# Patient Record
Sex: Male | Born: 1940
Health system: Southern US, Community
[De-identification: ages and names within clinical notes are randomized; demographics above are authoritative.]

## PROBLEM LIST (undated history)

## (undated) DIAGNOSIS — G4730 Sleep apnea, unspecified: Secondary | ICD-10-CM

## (undated) DIAGNOSIS — I2510 Atherosclerotic heart disease of native coronary artery without angina pectoris: Secondary | ICD-10-CM

## (undated) DIAGNOSIS — J45909 Unspecified asthma, uncomplicated: Secondary | ICD-10-CM

## (undated) DIAGNOSIS — J449 Chronic obstructive pulmonary disease, unspecified: Secondary | ICD-10-CM

## (undated) DIAGNOSIS — E785 Hyperlipidemia, unspecified: Secondary | ICD-10-CM

## (undated) DIAGNOSIS — N400 Enlarged prostate without lower urinary tract symptoms: Secondary | ICD-10-CM

## (undated) DIAGNOSIS — K219 Gastro-esophageal reflux disease without esophagitis: Secondary | ICD-10-CM

## (undated) HISTORY — DX: Sleep apnea, unspecified: G47.30

## (undated) HISTORY — PX: ADENOIDECTOMY: SUR15

## (undated) HISTORY — DX: Benign prostatic hyperplasia without lower urinary tract symptoms: N40.0

## (undated) HISTORY — DX: Hyperlipidemia, unspecified: E78.5

## (undated) HISTORY — DX: Unspecified asthma, uncomplicated: J45.909

## (undated) HISTORY — PX: GALLBLADDER SURGERY: SHX652

## (undated) HISTORY — DX: Atherosclerotic heart disease of native coronary artery without angina pectoris: I25.10

## (undated) HISTORY — DX: Chronic obstructive pulmonary disease, unspecified: J44.9

## (undated) HISTORY — PX: CARDIAC CATHETERIZATION: SHX172

## (undated) HISTORY — PX: TONSILLECTOMY: SUR1361

---

## 2004-05-07 ENCOUNTER — Ambulatory Visit: Payer: Self-pay | Admitting: Internal Medicine

## 2005-03-08 ENCOUNTER — Ambulatory Visit: Payer: Self-pay | Admitting: Internal Medicine

## 2005-03-30 ENCOUNTER — Ambulatory Visit: Payer: Self-pay | Admitting: Internal Medicine

## 2006-04-22 ENCOUNTER — Ambulatory Visit: Payer: Self-pay | Admitting: Internal Medicine

## 2007-06-23 ENCOUNTER — Ambulatory Visit: Payer: Self-pay | Admitting: Internal Medicine

## 2007-08-04 ENCOUNTER — Ambulatory Visit: Payer: Self-pay | Admitting: Internal Medicine

## 2008-12-13 ENCOUNTER — Ambulatory Visit: Payer: Self-pay | Admitting: Cardiology

## 2012-01-14 ENCOUNTER — Inpatient Hospital Stay: Payer: Self-pay | Admitting: Internal Medicine

## 2012-01-14 LAB — COMPREHENSIVE METABOLIC PANEL
Albumin: 3.3 g/dL — ABNORMAL LOW (ref 3.4–5.0)
Alkaline Phosphatase: 99 U/L (ref 50–136)
Anion Gap: 7 (ref 7–16)
BUN: 40 mg/dL — ABNORMAL HIGH (ref 7–18)
Bilirubin,Total: 0.6 mg/dL (ref 0.2–1.0)
Calcium, Total: 7.7 mg/dL — ABNORMAL LOW (ref 8.5–10.1)
Chloride: 102 mmol/L (ref 98–107)
Co2: 27 mmol/L (ref 21–32)
Creatinine: 2.26 mg/dL — ABNORMAL HIGH (ref 0.60–1.30)
EGFR (Non-African Amer.): 28 — ABNORMAL LOW
Glucose: 132 mg/dL — ABNORMAL HIGH (ref 65–99)
Osmolality: 284 (ref 275–301)
SGPT (ALT): 1428 U/L — ABNORMAL HIGH (ref 12–78)
Total Protein: 7 g/dL (ref 6.4–8.2)

## 2012-01-14 LAB — CK TOTAL AND CKMB (NOT AT ARMC)
CK, Total: 1378 U/L — ABNORMAL HIGH (ref 35–232)
CK-MB: 7.8 ng/mL — ABNORMAL HIGH (ref 0.5–3.6)

## 2012-01-14 LAB — SEDIMENTATION RATE: Erythrocyte Sed Rate: 1 mm/hr (ref 0–20)

## 2012-01-14 LAB — FIBRIN DEGRADATION PROD.(ARMC ONLY): Fibrin Degradation Prod.: <10 ug/ml (ref 2.1–7.7)

## 2012-01-14 LAB — CBC
MCH: 30.5 pg (ref 26.0–34.0)
MCHC: 32 g/dL (ref 32.0–36.0)
MCV: 95 fL (ref 80–100)
Platelet: 97 10*3/uL — ABNORMAL LOW (ref 150–440)
RBC: 5.04 10*6/uL (ref 4.40–5.90)
RDW: 14 % (ref 11.5–14.5)

## 2012-01-14 LAB — MAGNESIUM: Magnesium: 2 mg/dL

## 2012-01-14 LAB — PROTIME-INR
INR: 1.3
Prothrombin Time: 16.3 secs — ABNORMAL HIGH (ref 11.5–14.7)

## 2012-01-15 LAB — CBC WITH DIFFERENTIAL/PLATELET
Basophil #: 0 10*3/uL (ref 0.0–0.1)
Basophil %: 0.3 %
Eosinophil #: 0 10*3/uL (ref 0.0–0.7)
HCT: 48.2 % (ref 40.0–52.0)
Lymphocyte %: 8.1 %
MCH: 31.4 pg (ref 26.0–34.0)
MCHC: 32.9 g/dL (ref 32.0–36.0)
MCV: 95 fL (ref 80–100)
Monocyte #: 0.2 x10 3/mm (ref 0.2–1.0)
Monocyte %: 1.9 %
Neutrophil #: 7.7 10*3/uL — ABNORMAL HIGH (ref 1.4–6.5)
Neutrophil %: 89.6 %
RDW: 14.1 % (ref 11.5–14.5)

## 2012-01-15 LAB — URINALYSIS, COMPLETE
Bacteria: NONE SEEN
Glucose,UR: NEGATIVE mg/dL (ref 0–75)
Hyaline Cast: 7
Ketone: NEGATIVE
Protein: 30
Specific Gravity: 1.016 (ref 1.003–1.030)
Squamous Epithelial: <1
WBC UR: 4 /HPF (ref 0–5)

## 2012-01-15 LAB — PROTIME-INR: Prothrombin Time: 15.4 secs — ABNORMAL HIGH (ref 11.5–14.7)

## 2012-01-15 LAB — COMPREHENSIVE METABOLIC PANEL
Albumin: 3.2 g/dL — ABNORMAL LOW (ref 3.4–5.0)
Alkaline Phosphatase: 88 U/L (ref 50–136)
BUN: 40 mg/dL — ABNORMAL HIGH (ref 7–18)
Calcium, Total: 8 mg/dL — ABNORMAL LOW (ref 8.5–10.1)
Chloride: 101 mmol/L (ref 98–107)
Creatinine: 1.62 mg/dL — ABNORMAL HIGH (ref 0.60–1.30)
EGFR (African American): 48 — ABNORMAL LOW
Glucose: 127 mg/dL — ABNORMAL HIGH (ref 65–99)
Potassium: 5.1 mmol/L (ref 3.5–5.1)
SGOT(AST): 1503 U/L — ABNORMAL HIGH (ref 15–37)
Total Protein: 7.1 g/dL (ref 6.4–8.2)

## 2012-01-15 LAB — CK TOTAL AND CKMB (NOT AT ARMC)
CK, Total: 962 U/L — ABNORMAL HIGH (ref 35–232)
CK-MB: 8.4 ng/mL — ABNORMAL HIGH (ref 0.5–3.6)
CK-MB: 8 ng/mL — ABNORMAL HIGH (ref 0.5–3.6)

## 2012-01-15 LAB — RAPID INFLUENZA A&B ANTIGENS

## 2012-01-16 LAB — TROPONIN I: Troponin-I: 0.89 ng/mL — ABNORMAL HIGH

## 2012-01-16 LAB — CBC WITH DIFFERENTIAL/PLATELET
Basophil %: 1.5 %
Eosinophil %: 0 %
HGB: 13.9 g/dL (ref 13.0–18.0)
Lymphocyte %: 6.2 %
MCV: 96 fL (ref 80–100)
Monocyte #: 0.5 x10 3/mm (ref 0.2–1.0)
Monocyte %: 4.5 %
Neutrophil #: 9.1 10*3/uL — ABNORMAL HIGH (ref 1.4–6.5)
Neutrophil %: 87.8 %
Platelet: 95 10*3/uL — ABNORMAL LOW (ref 150–440)
RBC: 4.54 10*6/uL (ref 4.40–5.90)
RDW: 13.9 % (ref 11.5–14.5)
WBC: 10.4 10*3/uL (ref 3.8–10.6)

## 2012-01-16 LAB — PROTEIN / CREATININE RATIO, URINE
Creatinine, Urine: 51.7 mg/dL (ref 30.0–125.0)
Protein, Random Urine: 12 mg/dL (ref 0–12)

## 2012-01-16 LAB — COMPREHENSIVE METABOLIC PANEL
BUN: 29 mg/dL — ABNORMAL HIGH (ref 7–18)
Bilirubin,Total: 0.4 mg/dL (ref 0.2–1.0)
Chloride: 108 mmol/L — ABNORMAL HIGH (ref 98–107)
Co2: 29 mmol/L (ref 21–32)
EGFR (African American): >60
EGFR (Non-African Amer.): >60
Glucose: 144 mg/dL — ABNORMAL HIGH (ref 65–99)
Osmolality: 290 (ref 275–301)
SGPT (ALT): 808 U/L — ABNORMAL HIGH (ref 12–78)
Sodium: 141 mmol/L (ref 136–145)

## 2012-01-16 LAB — GAMMA GT: GGT: 11 U/L (ref 5–85)

## 2012-01-17 LAB — CBC WITH DIFFERENTIAL/PLATELET
Bands: 16 %
Basophil #: 0 10*3/uL (ref 0.0–0.1)
Basophil %: 0.1 %
Eosinophil %: 0.2 %
Lymphocyte #: 1.5 10*3/uL (ref 1.0–3.6)
MCH: 31.6 pg (ref 26.0–34.0)
MCV: 96 fL (ref 80–100)
Monocyte #: 0.8 x10 3/mm (ref 0.2–1.0)
Neutrophil %: 75.2 %
Platelet: 100 10*3/uL — ABNORMAL LOW (ref 150–440)
RBC: 4.82 10*6/uL (ref 4.40–5.90)
RDW: 14.5 % (ref 11.5–14.5)

## 2012-01-17 LAB — COMPREHENSIVE METABOLIC PANEL
Albumin: 2.9 g/dL — ABNORMAL LOW (ref 3.4–5.0)
Anion Gap: 3 — ABNORMAL LOW (ref 7–16)
BUN: 16 mg/dL (ref 7–18)
Bilirubin,Total: 0.8 mg/dL (ref 0.2–1.0)
Chloride: 110 mmol/L — ABNORMAL HIGH (ref 98–107)
Creatinine: 0.85 mg/dL (ref 0.60–1.30)
EGFR (African American): >60
Glucose: 80 mg/dL (ref 65–99)
Osmolality: 285 (ref 275–301)
Potassium: 4.5 mmol/L (ref 3.5–5.1)
Sodium: 143 mmol/L (ref 136–145)
Total Protein: 6.2 g/dL — ABNORMAL LOW (ref 6.4–8.2)

## 2012-01-17 LAB — CK TOTAL AND CKMB (NOT AT ARMC)
CK, Total: 302 U/L — ABNORMAL HIGH (ref 35–232)
CK-MB: 3.6 ng/mL (ref 0.5–3.6)

## 2012-01-17 LAB — LACTATE DEHYDROGENASE: LDH: 366 U/L — ABNORMAL HIGH (ref 85–241)

## 2012-01-18 LAB — COMPREHENSIVE METABOLIC PANEL
BUN: 10 mg/dL (ref 7–18)
Bilirubin,Total: 1.2 mg/dL — ABNORMAL HIGH (ref 0.2–1.0)
Calcium, Total: 8.2 mg/dL — ABNORMAL LOW (ref 8.5–10.1)
Chloride: 104 mmol/L (ref 98–107)
Creatinine: 0.84 mg/dL (ref 0.60–1.30)
EGFR (African American): >60
EGFR (Non-African Amer.): >60
Potassium: 3.8 mmol/L (ref 3.5–5.1)
SGPT (ALT): 640 U/L — ABNORMAL HIGH (ref 12–78)
Sodium: 142 mmol/L (ref 136–145)
Total Protein: 6.3 g/dL — ABNORMAL LOW (ref 6.4–8.2)

## 2012-01-18 LAB — UR PROT ELECTROPHORESIS, URINE RANDOM

## 2012-01-20 LAB — KAPPA/LAMBDA FREE LIGHT CHAINS (ARMC)

## 2012-01-20 LAB — PROTEIN ELECTROPHORESIS(ARMC)

## 2012-01-20 LAB — CULTURE, BLOOD (SINGLE)

## 2012-05-16 ENCOUNTER — Encounter: Payer: Self-pay | Admitting: Internal Medicine

## 2012-06-08 ENCOUNTER — Encounter: Payer: Self-pay | Admitting: Internal Medicine

## 2012-07-09 ENCOUNTER — Encounter: Payer: Self-pay | Admitting: Internal Medicine

## 2012-08-08 ENCOUNTER — Encounter: Payer: Self-pay | Admitting: Internal Medicine

## 2012-08-17 ENCOUNTER — Ambulatory Visit: Payer: Self-pay | Admitting: Unknown Physician Specialty

## 2012-08-18 LAB — PATHOLOGY REPORT

## 2014-04-01 ENCOUNTER — Ambulatory Visit: Payer: Self-pay | Admitting: Internal Medicine

## 2014-05-28 NOTE — Consult Note (Signed)
See dictated note.  CC: elevated ALT and AST could be due to viral rhabdo and be of muscle origin.  Will check aldolase and GGT to help distinguish the difference.  No specific treatment needed at this time.  Will follow with you/  Electronic Signatures: Manya Silvas (MD)  (Signed on 07-Dec-13 12:48)  Authored  Last Updated: 07-Dec-13 12:48 by Manya Silvas (MD)

## 2014-05-28 NOTE — Consult Note (Signed)
PATIENT NAME:  Chris Lozano, Chris Lozano MR#:  [id number] DATE OF BIRTH:  1940-04-07  DATE OF CONSULTATION:  01/17/2012  REFERRING PHYSICIAN:  [name], MD CONSULTING PHYSICIAN:  Heinz Knuckles. Jasai Sorg, MD  REASON FOR CONSULTATION: Chills, transaminitis, and elevated troponin.   HISTORY OF PRESENT ILLNESS: The patient is a 74 year old man with a past history significant for chronic obstructive pulmonary disease who was admitted on 01/14/2012 with rapid onset a few days before of chills and malaise. The patient had been in his usual state of health working and went to work on the day that he became ill. When he came home, he began having chills and increased shortness of breath. He was also having some muscle aches and some nasal congestion. He was felt to have influenza and was kept at home. He missed several days of work and began having worsening chills and fevers and presented for evaluation and was admitted to the hospital. He states that he has had no nausea, vomiting, change in his bowels, or abdominal pain, but on admission he was found to have elevated transaminases. These have come down over time. He had has had no chest pain, but has had some increasing shortness of breath. He had elevated CPK and troponins on admission. An echocardiogram showed no obvious myocardial dysfunction. He was initially treated with oseltamivir and is on his fifth day. He has not received any other antimicrobial therapy. Blood cultures have been negative.   ALLERGIES: No known drug allergies.   PAST MEDICAL HISTORY:  1. Sleep apnea.  2. Chronic obstructive pulmonary disease.   SOCIAL HISTORY: The patient lives with his wife. He has a cat at home. He smokes a pack of cigarettes per day. He works doing Theatre manager, in a factory setting. He denies any alcohol intake.  FAMILY HISTORY: No diabetes. No coronary artery disease. No recent sick contacts.   REVIEW OF SYSTEMS: GENERAL: Positive fevers, chills, and malaise. HEENT: He  has chronic headaches that occur intermittently but have not been bothering him recently. He has had some nasal congestion. No sinus congestion. Some sore throat. NECK: No stiffness. No swollen glands. RESPIRATORY: Positive cough without sputum production. Some increased shortness of breath. CARDIAC: No chest pain. Some increased dyspnea on exertion. No peripheral edema. GI: No nausea, no vomiting, no abdominal pain, and no change in his bowels. GENITOURINARY: No change in his urine. MUSCULOSKELETAL: He has had myalgias and arthralgias, but no frank arthritis. SKIN: No rashes. NEUROLOGIC: No focal weakness. No dizziness. No falls. PSYCHIATRIC: No complaints. All other systems are negative.   PHYSICAL EXAMINATION:   VITAL SIGNS: T-max 98.7, T-current 97.6, pulse 87, blood pressure 108/64, and 93% on 2 liters.   GENERAL: A 74 year old white man in no acute distress.   HEENT: Normocephalic, atraumatic. Pupils equal and reactive to light. Extraocular motion intact. Sclerae were slightly injected, but no evidence for emboli or petechiae in the sclerae, conjunctivae, or lids. Oropharynx shows no erythema or exudate. Gums are in fair condition.   NECK: Supple. Full range of motion. Midline trachea. No lymphadenopathy. No thyromegaly.   PULMONARY: Lungs are clear to auscultation bilaterally, although he did have some significant rhonchorous cough. The lung fields remained clear.   CARDIAC: Regular rate and rhythm without murmur, rub, or gallop.  ABDOMEN: Soft, nontender, and nondistended. No hepatosplenomegaly. No hernia is noted.   EXTREMITIES: No evidence for tenosynovitis. No edema.   SKIN: No rashes. No stigmata of endocarditis, specifically no Janeway lesions or Osler nodes.  NEUROLOGIC: The patient is awake and interactive, moving all four extremities.   PSYCHIATRIC: Mood and affect appeared normal.  LABS/RADIOLOGIC STUDIES: BUN 16, creatinine 0.85, bicarbonate 30, anion gap 3, AST 422, ALT  769, alkaline phosphatase 88, and total bilirubin 0.8. CPK 302, MB fraction 3.6, and troponin 0.48. On admission his LFTs showed an AST greater than 2000 and ALT of 1428 with an alkaline phosphatase of 99 and total bilirubin of 0.6. Total CPK was 1378 with a MB fraction of 7.8 and troponin 3.80. LFTs and cardiac enzymes have declined over the course of his hospitalization. White count today is 9.2 with a hemoglobin 15.2, platelet count 100, and ANC 6.9. On admission, his white count was 8.4.   Blood cultures are negative x2.   Rapid influenza testing is negative x2.   Urinalysis was unremarkable and culture had no growth.  Hepatitis serologies showed positive hepatitis A IgG/IgM, but a negative hepatitis A IgM. His hepatitis B surface antibody was negative, but all other hepatitis B serologies were negative and hepatitis C antibody was negative.   Echocardiogram performed on 01/15/2012 showed normal function with an ejection fraction of greater than 55%. The right ventricle was grossly normal in size. The aortic valve opened well. There was mild tricuspid regurgitation and mitral valve was grossly normal with trace mitral regurgitation. The left ventricular wall motion was normal.   CT of the chest and abdomen without contrast showed a moderate central lobular emphysema. There were multiple indeterminant pulmonary nodules the largest of which was 7 mm.  CT scan of the head without contrast showed no acute intracranial process.  Chest x-ray showed findings of chronic obstructive pulmonary disease, but no other acute infiltrates.   IMPRESSION: A 74 year old male with a history of chronic obstructive pulmonary disease who was admitted with viral myocarditis without cardiomyopathy.   RECOMMENDATIONS:  1. His constellation of symptoms appear to be consistent with a viral infection. He developed marked transaminitis and evidence for myocardial injury without any focal or generalized myocardial  dysfunction. There are numerous viruses which have been associated with this type of infection, including enterovirus such as coxsackie A and B, influenza, adenovirus, parvovirus, respiratory syncytial virus, and hepatitis B and C, among others. None of these have specific treatments available, except influenza for which he tested negative. He also tested negative for hepatitis B and C. There was a recent article linking RSV infection in older adults presenting with respiratory infections.  2. Given that he appears to be improving, I would follow his CPK and LFTs over time. There is no specific therapy available.  3. I would not continue Tamiflu therapy.  4. His echo showed no evidence of cardiomyopathy, which can be a long-term sequela of myocarditis.  5. He was strongly encouraged to quit smoking.   This is a moderately complex infectious disease case. Thank you very much for involving me in Chris Lozano care.  ____________________________ Heinz Knuckles. Takila Kronberg, MD meb:slb D: 01/17/2012 13:56:23 ET T: 01/17/2012 15:10:33 ET JOB#: [id number]  cc: Heinz Knuckles. Johnette Teigen, MD, <Dictator> [place] Edwin E Sinclair Alligood MD ELECTRONICALLY SIGNED 01/21/2012 10:18

## 2014-05-28 NOTE — Consult Note (Signed)
    General Aspect 74 yo male with history of sleep apnea with no cardiac history who was admitted after presenting to the er wiht several day history of fever, myalgias, weakness, anorexia, cough and increasing lethargy. He was noted to have elevated cpk, mb,troponin, hepatic enzymes, renal insuffiency and relative hypoxia. EKG was unremarkable for injury or ischemia and he denied chest pain. He is a difficult histgorian this am due to lethargy and somnolence. He has apparently been taking codeine cough preperation prior to admission. CXR did not reveal pulmonary edema but suggested pneumonitis. Chest ct unremarkable for acute process. Echo is pending. Pt was hypotgensive on admission.   Physical Exam:   GEN lethargic    HEENT dry oral mucosa    NECK supple    CARD Regular rate and rhythm  Normal, S1, S2  No murmur    ABD normal BS    LYMPH negative neck, negative axillae    EXTR negative cyanosis/clubbing, negative edema    SKIN No ulcers, skin turgor poor    NEURO cranial nerves intact, motor/sensory function intact    PSYCH lethargic   Review of Systems:   General: Fatigue  Fever/chills    Respiratory: Frequent cough    Cardiovascular: No Complaints    Musculoskeletal: Muscle or joint pain    ROS Pt not able to provide ROS  symptoms from history in chart; not able to give much history this am    Medications/Allergies Reviewed Medications/Allergies reviewed   EKG:   EKG NSR    Abnormal NSSTTW changes    No Known Allergies:     Impression 74 yo male with history of sleep apnea and emphysema who presented with several day history of myalgia, cough, weakness, fatigue and increasing somnolence. He was noted to have evidence of dehydration with renal insuffiency, elevated hepatic enzymes, elevated cardiac enzymes and markers. EKG and CXR unremarkable for acute ischemic changes or pulmonaary edema. Echo is pending. He was hypotensive on admission which has improved with  hydration. Etiology of multisystem abnormality is unclear at present. Blood cultures are negative thus far. Flu titer is negative thus far. While cardiac ischemia could explain his elevated cardiac markers and enzymes, lack of ischemic symptoms and ekgs changes suggests possible viril myocarditis. Would continue with hydration to treat volume depletion. Continue with asa at present. Agree with holding iv anticoagulation at present., Will review echo when available. Agree with gi evaluation and consider infectious disease eval.    Plan 1. IV hyddration 2. COntinnue with current meds 3. Will review echo when available 4. Continue with asa for now 5. Agree with holding heparin for now 6. Follow electrolytes, liver funciton and cardiac markers 7.Will follow with you   Electronic Signatures: Teodoro Spray (MD)  (Signed 07-Dec-13 08:12)  Authored: General Aspect/Present Illness, History and Physical Exam, Review of System, EKG , Allergies, Impression/Plan   Last Updated: 07-Dec-13 08:12 by Teodoro Spray (MD)

## 2014-05-28 NOTE — H&P (Signed)
PATIENT NAME:  COLLEN, VINCENT MR#:  [id number] DATE OF BIRTH:  04/13/1940  DATE OF ADMISSION:  01/14/2012  ADDENDUM:    Elevated D-dimer: The patient has hypoxemia and shortness of breath. He is not tachycardic but possibilities of pulmonary embolism are existent.  It is very unlikely with his hypercarbic respiratory failure, but is still a possibility. I cannot do a CT with IV contrast due to his significant acute kidney injury and I cannot anticoagulate him at this moment due to his significant thrombocytopenia and the possibility that with his liver failure he could develop disseminated intravascular coagulation, which is a difficult situation.  I am going to continue to follow up on those results.If this hypoxemia persist, again we will continue to do V/Q scan.    ____________________________ [place] Sink, MD rsg:bjt D: 01/14/2012 23:18:38 ET T: 01/15/2012 08:23:21 ET JOB#: [id number]  cc: [place] Sink, MD, <Dictator> Amaziah Raisanen America Brown MD ELECTRONICALLY SIGNED 01/23/2012 14:09

## 2014-05-28 NOTE — Discharge Summary (Signed)
PATIENT NAME:  Chris Lozano, Chris Lozano MR#:  [id number] DATE OF BIRTH:  1940-12-15  DATE OF ADMISSION:  01/14/2012 DATE OF DISCHARGE:  01/18/2012  FINAL DIAGNOSES:  1. Viral myocarditis.  2. Viral hepatitis.  3. Acute renal failure.  4. Acute on chronic respiratory failure.  5. Chronic obstructive pulmonary disease with acute exacerbation.  6. Dehydration, resolved.  7. Sleep apnea, constant positive airway pressure intolerant.   HISTORY AND PHYSICAL: Please see the dictated admission history and physical.   [place]: The patient was admitted with altered mental status, finding of acute respiratory failure with respiratory acidosis, as well as acute renal failure. He was found to have elevated cardiac enzymes, evidence of rhabdomyolysis, and severely elevated AST and ALT. He was placed on IV fluids. Nephrology and gastroenterology were consulted as was cardiology. Multiple cultures were sent, which were negative. C-reactive protein was markedly elevated, and hepatitis serologies were all negative. It was felt that he likely had a viral syndrome, the exact virus is unclear, and this was likely related to hepatitis, rhabdomyolysis, which had led to renal failure. His kidney function returned to normal, fortunately. His liver enzymes have trended downward. His acute respiratory failure improved; however, his oxygen saturation is 86% on room air at rest, and so he will need to be continued on oxygen 2 liters nasal cannula and this was arranged.   His mental status was initially altered as noted above, it did improve. He had some morning confusion, suggesting there may be some mild underlying dementia; however, this improved over the course of the day and was back to normal per family members. He had no further fevers or chills. His CKs trended down nicely while on IV fluids.   As noted above, cardiology consultation was obtained for the elevated troponin. Discussed that technically this meets  the criteria for acute myocardial infarction in that his exact history is unclear, but did have some shortness of breath, however, it was thought more likely this was a viral myocarditis rather than an infarction. He is not a candidate for statins secondary to his elevated transaminases. His systolic blood pressures range 110 to 115 and his heart rate in the 60s, and so was not a candidate for beta blockers, angiotensin receptor blockers or ACE inhibitors. He underwent echocardiogram, which revealed preserved LV function without evidence of inflammatory myocarditis on the echo, fortunately.   He is ambulating well, going over 350 feet independently, although again required oxygen as noted above. It was felt that he would benefit from home health to monitor his oxygenation as well as the patient's poor overall understanding of his medical issues and complicated nature of his course. He will be held out of work until he follows up in our office, but be discharged in stable condition on home oxygen 2 liters nasal cannula continuously with the plan to return to see [place] within one week. His diet should be 2 gram sodium. His physical activity will be up as tolerated.   DISCHARGE MEDICATIONS:  1. Astelin two sprays b.i.d., one spray to each nostril.  2. Enteric-coated aspirin 325 mg p.o. daily.  3. Ranitidine 150 mg p.o. daily for gastric protection.  4. NicoDerm patch 14 mg topically daily x6 weeks, then will wean from there.  5. Tussionex 5 mL p.o. b.i.d. p.r.n. cough.  6. Advair 100/50 b.i.d.  7. Albuterol metered-dose inhaler 2 puffs q.4 hours as needed for wheezing.   NOTE: He was given instructions to avoid over-the-counter cold medications  unless he discussed with his doctor. He should hold his lovastatin until okay by his physician.    ____________________________ Adin Hector, MD bjk:ap D: 01/18/2012 13:00:42 ET T: 01/18/2012 14:45:35 ET JOB#: [id number]  cc: Tama High III, MD,  <Dictator> Javier Docker. Ubaldo Glassing, MD [name] MD ELECTRONICALLY SIGNED 01/20/2012 12:24

## 2014-05-28 NOTE — Consult Note (Signed)
Patient had rhabdo from viral illness which affected his muscles but did not hit his liver.  His GGT was normal, indicating that the process did not cause elevation of SGOT or SGPT from the liver but from the muscles.  Therefore with nl TB and alk phos I will sign off, reconsult if needed.  Electronic Signatures: Manya Silvas (MD)  (Signed on 08-Dec-13 10:01)  Authored  Last Updated: 08-Dec-13 10:01 by Manya Silvas (MD)

## 2014-05-28 NOTE — Consult Note (Signed)
Impression: 74yo male w/ h/o COPD admitted with viral myocarditis without cardiomyopathy.  His constellation of symptoms appear to be consistent with a viral infection.  He developed marked transaminitis and evidence for myocardial injury without any focal or generalized myocardial dysfunction.  There are numerous viruses which have been associated with this infection, including Enteroviruses such as [place] A and B, Influenza, Adenovirus, Parvovirus, RSV, hepatitis B and C, among others.  None of these have specific treatments available except Influenza for which he tested negative.  There was recently an article linking RSV to infection in older adults. Given that he appears to be improving, would follow his CPK and LFTs over time.  No specific therapy is available. His echo showed no evidence for cardiomyopathy which can be a long term sequela of myocarditis. He was strongly encouraged to quit smoking.    Electronic Signatures: Ahmadou Bolz, Heinz Knuckles (MD) (Signed on 09-Dec-13 13:49)  Authored   Last Updated: 09-Dec-13 13:54 by Estuardo Frisbee, Heinz Knuckles (MD)

## 2014-05-28 NOTE — Consult Note (Signed)
PATIENT NAME:  Chris Lozano, Chris Lozano MR#:  [id number] DATE OF BIRTH:  [birth year]  DATE OF CONSULTATION:  01/15/2012  REFERRING PHYSICIAN:   CONSULTING PHYSICIAN:  Manya Silvas, MD  HISTORY OF PRESENT ILLNESS: Patient is a 74 year old white male who was admitted to the hospital because of severe viral-like illness characterized by fever, runny nose, myalgias, arthralgias, chills and breathing difficulty as well as a dehydration.   Patient was deemed to have multiorgan failure including respiratory, heart, kidney and liver problems. He was thought to have rhabdomyolysis because of a viral syndrome. He had elevated SGPT, SGOT and I was asked to see him for possible liver involvement.   Patient's wife says that he actually got sick five days ago and had drenching sweats and muscle aches but his muscle aches have improved. Currently at this moment he denies muscle pain, denies nausea, vomiting, dysphagia, diarrhea, constipation.   PAST MEDICAL HISTORY:  1. Chronic obstructive pulmonary disease.  2. Obstructive sleep apnea. 3. Benign prostatic hypertrophy,   PAST SURGICAL HISTORY:  1. Gallbladder removal.  2. Tonsillectomy.   SOCIAL HISTORY: He smoked a pack a day for 50 years.   MEDICATIONS: Aspirin and vitamins and unknown prostate medication.   ALLERGIES: No known drug allergies.   PHYSICAL EXAMINATION:  GENERAL: Elderly white male in no acute distress sitting up in bed on oxygen.   HEENT: Sclerae nonicteric. Conjunctivae negative.   CHEST: Decreased air flow in anterior fields.   HEART: No murmurs or gallops I can hear.   ABDOMEN: Nontender. No hepatosplenomegaly. No masses. No bruits.   SKIN: Warm and dry.   LABORATORY, DIAGNOSTIC AND RADIOLOGICAL DATA: B-type natriuretic peptide 5500, creatinine 2.26, BUN 40, total protein 7, albumin 3.3, total bilirubin 0.6, alkaline phosphatase 99, SGOT greater than 2000, SGPT 1428, CPK 1378, CPK-MB 7.8. Troponin 3.8; that was on admission.  Today SGOT 1500, SGPT 1200, CPK 1400, CPK-MB 8.4, troponin 1.8, white count 8.5, hemoglobin 15.9, platelet count 88. CT of the chest and abdomen done for multiorgan failure shows moderate centrilobular emphysema, multiple indeterminant pulmonary nodules. The liver, spleen, adrenals and pancreas were unremarkable. CT of his head showed no abnormality. Chest x-ray showed mild interstitial edema.   ASSESSMENT: Rhabdomyolysis from viral syndrome, very likely is the cause for his elevated SGPT and SGOT. It is quite possible these could be muscle enzymes rather than coming from the liver. His bilirubin is normal, alkaline phosphatase is normal and his liver is not enlarged and is not palpable and there is no tenderness in the right upper quadrant.   PLAN: Will get a GGT and get an aldolase. Follow SGOT and SGPT. I expect these to continue to come down and do not foresee any serious problems with his liver during this illness.  ____________________________ Manya Silvas, MD rte:cms D: 01/15/2012 12:55:45 ET T: 01/15/2012 13:24:26 ET JOB#: [id number]  cc: Manya Silvas, MD, <Dictator> Adin Hector, MD Leonie Douglas. Doy Hutching, MD Ocie Cornfield. Ouida Sills, MD  Manya Silvas MD ELECTRONICALLY SIGNED 01/22/2012 11:56

## 2014-05-28 NOTE — H&P (Signed)
PATIENT NAME:  Chris Lozano, Chris Lozano MR#:  [id number] DATE OF BIRTH:  1940-09-11  DATE OF ADMISSION:  01/14/2012  REFERRING PHYSICIAN: Dr. Jasmine December  PRIMARY CARE PHYSICIAN:  [name]   REASON FOR ADMISSION: Malaise and increased shortness of breath.   HISTORY OF PRESENT ILLNESS: Mr. Brozek is a very nice 74 year old gentleman who has a history of chronic obstructive pulmonary disease and sleep apnea. The patient is a patient of [name] and he called him at the beginning of the week complaining of fever, runny nose, myalgias, arthralgias, and chills. They suspected that he could have the flu and told him not to go to the clinic, just rest and have plenty of fluids. The patient has been working and did not show up to work in the last couple of days due to the severity of his illness. Apparently everything started with a significant fever, runny nose, cough, and a temperature of 100 degrees Fahrenheit.  The patient started developing some chills within the last couple of days and he has decreased his urine production significantly. His urine has been really concentrated and orange color. He has been very thirsty and dehydrated and drinking large amounts of fluid. He started having some balance problems today and shortness of breath. Overall he has chronic shortness of breath for which he cannot walk more than 10 feet without being winded, but today the shortness of breath was more significant and he actually had significant decrease of his oxygen saturation at 80% on room air.  The wife states that the patient has been confused and drifts to one side and has not been able to even stand today. He also had significant trembling.   On examination in the ER the patient looks significantly ill. He has multiorgan failure including liver, kidney, heart, and respiratory.  His ABGs showed that the patient was significantly acidotic with pH 7.24 and pCO2 64. The patient has been put on oxygen and his oxygen  saturation has been improving. He is actually a very stoic person and he says that nothing hurts.   REVIEW OF SYSTEMS: CONSTITUTIONAL: Positive fever, positive fatigue. Positive weakness. No significant weight loss or weight gain. EYES: Denies any blurry vision, redness, inflammation or decreased eyesight. ENT: Denies any tinnitus, ear pain, or hearing loss. Denies any epistaxis. Positive snoring, positive postnasal drip, positive rhinorrhea and runny nose. Negative odynophagia or sore throat but the wife says that he has been having some trouble talking with a raspy voice. No difficulty swallowing. RESPIRATORY: Positive cough. Positive wheezing. Positive dyspnea. Positive increased secretions, all clear. Negative pneumonia. Positive chronic obstructive pulmonary disease. Negative painful respirations. CARDIOVASCULAR: No chest pain, no orthopnea, no edema, no arrhythmias, no palpitations, no syncope, no edemas. GI: No nausea, vomiting, or diarrhea. No abdominal pain. No hematemesis. No melena. No gastroesophageal reflux disease. No jaundice. No rectal bleeding. No constipation. No hemorrhoids. GU: Positive for decreased urine production with very concentrated urine. No dysuria or hematuria. ENDOCRINE: Positive thirst, but no polyuria or polyphagia. He actually had a very low appetite. No thyroid problems or cold or heat intolerance. HEMATOLOGIC/LYMPHATIC: No anemia, easy bruising, or swollen glands.  SKIN: Positive very dry skin but no rashes or petechiae. MUSCULOSKELETAL: Positive myalgias and arthralgias all over his body. NEUROLOGIC: No numbness or weakness. No dysarthria. No CVA or transient ischemic attack. Positive balance problems. PSYCH: Negative for anxiety or depression.   PAST MEDICAL HISTORY:  1. Chronic obstructive pulmonary disease/emphysema.  2. Obstructive sleep apnea, not using  CPAP, cannot tolerate.  3. History of benign prostatic hypertrophy.  ALLERGIES: No known drug allergies.   PAST  SURGICAL HISTORY:  1. Cholecystectomy. 2. Tonsillectomy.   FAMILY HISTORY: The patient states no cancer. No coronary artery disease. No strokes. No hypertension or diabetes.   SOCIAL HISTORY: The patient works at  [employer] in maintenance. He has smoked one pack a day for the past 50 years and he is not interested in quitting. Tobacco cessation counseling given to the patient for at least four minutes.  Alcohol: He denies any significant alcohol use.  He states that he has not drank in many years and the family corroborates the history.   MEDICATIONS:  Positive aspirin, vitamins, and he is taking a prostate medication that he does not know the name of.    PHYSICAL EXAMINATION:  VITAL SIGNS: Blood pressure of 100/63, pulse around 79-89, temperature 99.2, pulse oximetry 80% on room air.   GENERAL: The patient is alert and oriented times three. No significant acute distress or respiratory distress at this moment. No use of accessory muscles. The patient looks acutely ill, though. He looks very dehydrated and at this moment he is very passive not interacting much but he engages in conversation once he is addressed.   HEENT: His pupils are equal and reactive. Extraocular movements are intact. His mucosa is very dry. Anicteric sclerae. Conjunctivae pale. No oral lesions. No oropharyngeal exudates.   NECK: Supple. No JVD. No thyromegaly. No adenopathy. No lymphadenopathy. No neck rigidity.  No masses. Normal range of motion. No carotid bruits are auscultated.   CARDIOVASCULAR: Regular rate and rhythm with occasional PVCs. The patient has distant heart tones.  No displacement of PMI. No pain to palpation of the chest wall.   LUNGS: Overall clear. No signs of congestion. No rhonchi. No wheezing. Decreased respiratory sounds in bases. No dullness to percussion. No use of accessory muscles at this moment.   ABDOMEN: Soft, nontender, nondistended. No hepatosplenomegaly. No masses. No pain with  palpation and percussion of the liver. No hepatic stigmata. No rebound tenderness. Bowel sounds are positive.   GENITAL: Negative for external lesions.   EXTREMITIES: No edema, no cyanosis, no clubbing. Positive very cold extremities with mottled skin on the tips of the fingers. Capillary refill about 5 seconds.   SKIN: Very dry, decreased turgor. No rashes or petechiae. The patient looks very pale.   NEUROLOGIC: Cranial nerves II through XII intact. Strength is equal in four extremities. No focal deficits.   LYMPHATICS: Negative for lymphadenopathy in the neck or supraclavicular areas.   PSYCHIATRIC: The patient has a flat affect but he is oriented times three.   MUSCULOSKELETAL: Pain to palpation of multiple joint groups and arthralgias, but no arthritis.   LABORATORY DATA: Chest x-ray showed mild interstitial edema that could be interstitial infiltrates, small nodular density overlying the right lung which could be nipple shadow. CT of the abdomen and chest showed moderate centrilobular emphysema, multiple intermediate pulmonary nodules, the largest of which measures 7 mm. Follow-up noncontrast CT in three months recommended. Liver, spleen, adrenals, and pancreas look okay without any specific abnormalities. Kidneys are fine without any hydronephrosis. No lytic or aggressive osseous problems. BNP is 5500, glucose 132. BUN 40, creatinine 2.26, sodium 136, potassium 4.8, calcium 7.7. LFTs showed normal alkaline phosphatase, decreased albumin, more than 2000 AST  and more than 1400 ALT. Total CK of 1300, troponin is 3.8.  White count is 8.4, red blood cells 5. Erythrocyte sedimentation rate is  1. Hemoglobin 15, platelets are 97. INR is 1.3. D-dimer is 1.12. pH 7.24, pCO2 64, pO2 91 on 2 liters nasal cannula, HCO3 27.   EKG: No ST elevation. No ST depression. Positive premature ventricular contractions. Sinus rhythm.  P pulmonale is positive. No point TT waves.   ASSESSMENT AND PLAN: 74 year old  gentleman with history of chronic obstructive pulmonary disease and obstructive sleep apnea, had 4 to 5 days of flu-like symptoms with cough, rhinorrhea, increased shortness of breath, increased thirst, myalgias, arthralgias, and significant dehydration comes to the ER and is found to be severely dehydrated with multiorgan failure.  1. Viral-like syndrome: The patient has a negative flu swab. I cannot exclude the possibility of this being the flu since the test is not 100% accurate; the test does not exclude the possibility of this being the flu.  The patient is very sick and his sickness is highly suspicious of the flu for which I am going to start treatment with Tamiflu. I will repeat the testing with rapid flu in the morning and I am going to send serum antibodies for the flu to the outpatient facility for confirmation of the diagnosis. This could be also a different viral entity.  2. Multiorgan failure:  a.) Acute respiratory failure: The patient came in with elevated CO2, decreased oxygen saturations of 80%, did have improvement with 2 liters nasal cannula. Still the patient has significant shortness of breath. He does have chronic obstructive pulmonary disease and sleep apnea, although this seems to be an acute process. His x-rays show interstitial edema that could be mostly interstitial pneumonitis. No signs of pulmonary edema related to cardiogenic edema or fluid overload. The patient actually sounds very clear  in his chest right now despite the fact that his BNP is elevated. We will continue with oxygen and nebulizers and we have to monitor closely for progression of his respiratory failure. He has hypercarbic respiratory failure, which I think is more chronic in nature due to his sleep apnea and chronic obstructive pulmonary disease, although, again, this is an acute process that is making it worse.  b.) Cardiovascular failure: The patient is not in congestive heart failure, but he has elevated  troponin. He does not have any chest pain. No previous history of coronary artery disease. This could be related to viral myocarditis versus non-ST myocardial infarction. The non-ST myocardial infarction could be secondary to systemic failure and cardiovascular burden the patient is having. The case was discussed with Dr. Jasmine December who has called Dr. Ubaldo Glassing. Dr. Ubaldo Glassing has indicated no anticoagulation at this moment due to the patient being so sick and his low platelets. He has having low blood pressure due to cardiovascular or circulatory collapse but he is not in imminent shock at this moment. We are going to continue IV fluid hydration. If the patient starts having pulmonary edema, we will have to back off on the fluids and possibly use a BiPAP or CPAP.       c.) Liver failure: Shock liver due to hypotension and rhabdomyolysis,  possible inflammation  due to vital process. Serial enzymes are going to be checked. Coags are stable right now. His INR is 1.3 and his PTT is normal. I am going to order a fibrinogen and fibrin degradation products to rule out beginning of DIC. GI consultation granted at this moment.  3. Acute kidney injury due to dehydration, rhabdomyolysis, and inflammatory process: IV fluids are being given. I am not going to treat the rhabdomyolysis aggressively due  to the fact that the patient has a non-ST myocardial infarction at this moment and I do not want to cause him to have severe heart failure. Renal consult is ordered since this could be ATN.  4. Rhabdomyolysis due to myositis: The patient has a viral syndrome with severe myalgias. He is dehydrated and that would be the cause of the rhabdomyolysis.  5. Elevated cardiac enzymes, non-ST myocardial infarction: Continue aspirin. No anticoagulation at this moment. No blood pressure medications like a beta blocker due to borderline low blood pressure. His heart rate is actually stable. Cardiac consult and echocardiogram ordered. Possibly due to  viral myositis.  6. Chronic obstructive pulmonary disease: Stable. We are going to continue nebulizers as needed.  7. Sleep apnea: The patient has elevated CO2. He does not want to use BiPAP at this moment.  8. CODE STATUS: The patient is FULL CODE.   I discussed the case with Dr. Doy Hutching who is aware of it. We are going to sign off the case to [facility] since [name] is his primary care physician.   TIME SPENT: I spent 85 minutes with this patient today and this case, discussing the case with consultant, Dr. Doy Hutching, and the ER physician.         The patient is critical care and he is going to be put in the Critical Care Unit.    ____________________________ [place] Sink, MD rsg:bjt D: 01/14/2012 22:53:19 ET T: 01/15/2012 07:26:49 ET JOB#: [id number]  cc: [place] Sink, MD, <Dictator> Adin Hector, MD Cristi Loron MD ELECTRONICALLY SIGNED 01/23/2012 14:08

## 2014-07-02 ENCOUNTER — Ambulatory Visit: Payer: PPO | Attending: Internal Medicine

## 2014-07-02 DIAGNOSIS — G4733 Obstructive sleep apnea (adult) (pediatric): Secondary | ICD-10-CM | POA: Diagnosis not present

## 2014-07-31 ENCOUNTER — Ambulatory Visit: Payer: PPO | Attending: Internal Medicine

## 2014-07-31 DIAGNOSIS — G4761 Periodic limb movement disorder: Secondary | ICD-10-CM | POA: Diagnosis not present

## 2014-07-31 DIAGNOSIS — G4733 Obstructive sleep apnea (adult) (pediatric): Secondary | ICD-10-CM | POA: Insufficient documentation

## 2014-08-13 ENCOUNTER — Telehealth: Payer: Self-pay | Admitting: Internal Medicine

## 2014-08-13 NOTE — Telephone Encounter (Signed)
Left Voice Message asking the patient to call us back to schedule appointment.  08/13/14 MAF

## 2014-08-13 NOTE — Telephone Encounter (Signed)
Ok you can make him an appt.

## 2014-08-13 NOTE — Telephone Encounter (Signed)
Pt called and left voice message (LVM) wanting a consultation about an over active bladder. Best contact # is [phone] 08/13/14 MAF

## 2014-08-14 ENCOUNTER — Ambulatory Visit: Payer: PPO | Attending: Physician Assistant

## 2014-08-14 DIAGNOSIS — M545 Low back pain, unspecified: Secondary | ICD-10-CM

## 2014-08-14 NOTE — Therapy (Addendum)
[place] MAIN [facility] SERVICES [address] [place], [place], [address] Phone: [phone]   Fax:  [phone]  Physical Therapy Evaluation  Patient Details  Name: Chris Lozano MRN: [id number] Date of Birth: [birth year] Referring Provider:  Jeri Cos, MD  Encounter Date: 08/14/2014      PT End of Session - 08/14/14 1614    Visit Number 1   Number of Visits 9   Date for PT Re-Evaluation 09/11/14   PT Start Time 1300   PT Stop Time 1400   PT Time Calculation (min) 60 min   Activity Tolerance Patient tolerated treatment well   Behavior During Therapy [facility] for tasks assessed/performed      Past Medical History  Diagnosis Date  . COPD (chronic obstructive pulmonary disease)   . CAD (coronary artery disease)   . Asthma     History reviewed. No pertinent past surgical history.  There were no vitals filed for this visit.  Visit Diagnosis:  Right-sided low back pain without sciatica      Subjective Assessment - 08/14/14 1600    Subjective pt relates he has had right low back pain for ~ 3 years now and it has stayed fairly constant over the years.  pt recently saw a MD who did a full work up and told the pt he has "disk that is hitting the neve" and is causing his back pain.  pt relates he was on prednisone for 7 days which seemed to help.  he currently has no pain and reports pain is at it's worst after extended standing and prologned walking with rating of 8/10.  pt relates flexing helps releive pain and so does sitting and sleeping.  He denis any numbness and tingling and reports having an overactive bladder and is seeking a consultation with a urologist.     Patient Stated Goals decrease pain with walking    Currently in Pain? No/denies   Multiple Pain Sites No            [facility] PT Assessment - 08/15/14 0001    Assessment   Medical Diagnosis DDD, scoliosis, spinal stenosis    Onset Date/Surgical Date 08/15/11   Hand Dominance  Right   Precautions   Precautions None   Restrictions   Weight Bearing Restrictions No   Balance Screen   Has the patient fallen in the past 6 months No   Has the patient had a decrease in activity level because of a fear of falling?  No   Is the patient reluctant to leave their home because of a fear of falling?  No   Home Ecologist residence   Living Arrangements Spouse/significant other   Available Help at Discharge Family   Type of [place] Multi-level   Alternate Level Stairs-Number of Steps 5   Alternate Level Stairs-Rails Right;Left   Home Equipment None   Prior Function   Level of [place] Retired   Associate Professor   Overall Cognitive Status Within Functional Limits for tasks assessed   Observation/Other Assessments   Skin Integrity intact   Sensation   Light Touch Appears Intact   Coordination   Gross Motor Movements are Fluid and Coordinated Yes   Posture/Postural Control   Posture/Postural Control Postural limitations   Postural Limitations Rounded Shoulders;Increased thoracic kyphosis;Decreased lumbar lordosis   ROM / Strength   AROM / PROM / Strength AROM;Strength   AROM  Overall AROM  Deficits   AROM Assessment Site Lumbar   Lumbar Flexion WFL   Lumbar Extension limited   Strength   Overall Strength Deficits   Strength Assessment Site --  abdominal    Lumbar Flexion 3/5   Special Tests    Special Tests Sacrolliac Tests;Leg LengthTest   Sacroiliac Tests  Pelvic Distraction   Leg length test  Apparent   Pelvic Dictraction   Findings Negative   Pelvic Compression   Findings Negative   Side --   Sacral thrust    Findings Negative   Sacral Compression   Findings Negative   Ambulation/Gait   Gait Pattern Step-through pattern;Decreased stride length;Decreased trunk rotation   Ambulation Surface Level       Reflexes: Knee jerk: +1 Ankle jerk: absent  Myotome/dermotome:  [facility]  Lumbar flexion AROM: WNL Lumbar extension AROM: limited Repeated lumbar flexion/extension x10: no change in symptoms   SI joint provocation test: Compression: negative FABER:positive Thigh thrust: negative  Core strength MMT: 03/5  Bilateral hip flexion MMT: 4/5  Left leg length: right inominate  anteriorly rotated  Heel left on left LE to assist with functional leg length discrepancy                    PT Education - 08/14/14 1613    Education provided Yes   Education Details to use heel lift provided and observe if it decreases low back pain and to remove lift if becomes painful   Person(s) Educated Patient   Methods Explanation   Comprehension Verbalized understanding             PT Long Term Goals - 08/14/14 1840    PT LONG TERM GOAL #1   Title pt will be able to stand for ~20 min with 3/10 pain to do ADLs such as the dishes   Baseline prologned standing produces 8/10 pain    Time 4   Period Weeks   Status New   PT LONG TERM GOAL #2   Title pt will abe able ambulate at least 15 min with less than 4/10 pain    Baseline extended walking produces 8/10 pain   Time 4   Period Weeks   Status New   PT LONG TERM GOAL #3   Title pt will be able to sit with improved posture (decreased thoracic kyphosis and increased lumbar lordosis) for at least 20 min to decrease pain    Baseline sitting with slumped posture    Time 4   Period Weeks   Status New               Plan - 08/14/14 1814    Clinical Impression Statement pt is a 74 year old male with 74 year old history of low back pain.  pt is tender to at PSIS and 2-3 inches inferior and this reproduces his pain he experiences.  pt presents with leg length discrepancy in supine, with right leg shorten and lengthen post SI joint muscle energy correcton. pt tested negative for compression, thigh thrust, and compression but tested positive for FABER.  p pt would benefit from skilled PT services to  treat SI joint dysnfunction, abnormal posture, and strength core.     Pt will benefit from skilled therapeutic intervention in order to improve on the following deficits Difficulty walking;Pain;Decreased activity tolerance;Postural dysfunction;Decreased strength;Decreased range of motion;Hypomobility   Rehab Potential Good   PT Frequency 2x / week   PT Duration 4 weeks  PT Treatment/Interventions Aquatic Therapy;Moist Heat;Functional mobility training;Therapeutic activities;Therapeutic exercise;Patient/family education;Neuromuscular re-education;Manual techniques;Electrical Stimulation;Dry needling;Traction          G-Codes - 2014-09-04 1512    Functional Assessment Tool Used posture, pain scale, clinical impression   Functional Limitation Mobility: Walking and moving around   Mobility: Walking and Moving Around Current Status [phone]) At least 20 percent but less than 40 percent impaired, limited or restricted   Mobility: Walking and Moving Around Goal Status [phone]) At least 1 percent but less than 20 percent impaired, limited or restricted       Problem List There are no active problems to display for this patient.  Renford Dills, SPT Tortorici,[place] 2014/09/04, 4:09 PM This entire session was performed under direct supervision and direction of a licensed therapist/therapist assistant . I have personally read, edited and approve of the note as written. Gorden Harms. Tortorici, PT, DPT [phone]  [place] MAIN [facility] SERVICES [address] [place], [place], [address] Phone: [phone]   Fax:  [phone]

## 2014-08-15 NOTE — Addendum Note (Signed)
Addended by: Mariane Masters on: 08/15/2014 04:16 PM   Modules accepted: Orders

## 2014-08-19 ENCOUNTER — Ambulatory Visit: Payer: PPO

## 2014-08-19 DIAGNOSIS — M545 Low back pain, unspecified: Secondary | ICD-10-CM

## 2014-08-19 NOTE — Patient Instructions (Signed)
Pavlov press with green band 2x10 each side

## 2014-08-19 NOTE — Therapy (Signed)
[place] MAIN [facility] SERVICES [address] [place], [place], [address] Phone: [phone]   Fax:  [phone]  Physical Therapy Treatment  Patient Details  Name: Chris Lozano MRN: [id number] Date of Birth: 12/16/1940 Referring Provider:  Adin Hector, MD  Encounter Date: 08/19/2014      PT End of Session - 08/19/14 1810    Visit Number 2   Number of Visits 9   Date for PT Re-Evaluation 09/11/14   PT Start Time 1700   PT Stop Time 7939   PT Time Calculation (min) 48 min   Activity Tolerance Patient tolerated treatment well   Behavior During Therapy [facility] for tasks assessed/performed      Past Medical History  Diagnosis Date  . COPD (chronic obstructive pulmonary disease)   . CAD (coronary artery disease)   . Asthma     History reviewed. No pertinent past surgical history.  There were no vitals filed for this visit.  Visit Diagnosis:  Right-sided low back pain without sciatica      Subjective Assessment - 08/19/14 1801    Subjective pt relates he is doing well and has no pain currently.  pt reports he forgot to use the heel lift over the weekend and will try again this week to see if it helps.    Patient Stated Goals decrease pain with walking    Currently in Pain? No/denies         LLD=negative  Length length difference between both LE is 1cm  There ex: Knees to chest 3x20 sec Trunk rotation 2x10 Bridges 3x10, pt required verbal cueing for correct breathing sequencing Quadruped with UE lift 2x10 Standing plank on table 3x20 sec Bilateral standing side plank on table  1x20 sec pavlov press with green brand 2x10 each side Scapular retractions x10 Pt required min-mod verbal and tactile cueing for correct exercise technique and breathing sequencing                         PT Education - 08/19/14 1804    Education provided Yes   Education Details treatment will focus on strengthening core to  help stabilize spine    Person(s) Educated Patient   Methods Explanation   Comprehension Verbalized understanding             PT Long Term Goals - 08/14/14 1840    PT LONG TERM GOAL #1   Title pt will be able to stand for ~20 min with 3/10 pain to do ADLs such as the dishes   Baseline prologned standing produces 8/10 pain    Time 4   Period Weeks   Status New   PT LONG TERM GOAL #2   Title pt will abe able ambulate at least 15 min with less than 4/10 pain    Baseline extended walking produces 8/10 pain   Time 4   Period Weeks   Status New   PT LONG TERM GOAL #3   Title pt will be able to sit with improved posture (decreased thoracic kyphosis and increased lumbar lordosis) for at least 20 min to decrease pain    Baseline sitting with slumped posture    Time 4   Period Weeks   Status New               Plan - 08/19/14 1811    Clinical Impression Statement pt presents with minimal functional and anatomical  Leg length difference (  1cm), and weak glute musculature (2/5 MMT).  pt was tender to palpation on his right posterior hip which decreased after treatment, which consisted of core stabilization exercises.  pt would benefit from continued skilled PT services to improve strength, ROM and functional activity tolerance.    Pt will benefit from skilled therapeutic intervention in order to improve on the following deficits Difficulty walking;Pain;Decreased activity tolerance;Postural dysfunction;Decreased strength;Decreased range of motion;Hypomobility   Rehab Potential Good   PT Frequency 2x / week   PT Duration 4 weeks   PT Treatment/Interventions Aquatic Therapy;Moist Heat;Functional mobility training;Therapeutic activities;Therapeutic exercise;Patient/family education;Neuromuscular re-education;Manual techniques;Electrical Stimulation;Dry needling;Traction        Problem List There are no active problems to display for this patient.  Renford Dills,  SPT Tortorici,Ashley 08/20/2014, 1:56 PM This entire session was performed under direct supervision and direction of a licensed therapist/therapist assistant . I have personally read, edited and approve of the note as written. Gorden Harms. Tortorici, PT, DPT [phone]  [place] MAIN [facility] SERVICES [address] [place], [place], [address] Phone: [phone]   Fax:  [phone]

## 2014-08-21 ENCOUNTER — Ambulatory Visit: Payer: PPO

## 2014-08-21 DIAGNOSIS — M545 Low back pain, unspecified: Secondary | ICD-10-CM

## 2014-08-21 NOTE — Therapy (Signed)
[place] MAIN [facility] SERVICES [address] [place], [place], [address] Phone: [phone]   Fax:  [phone]  Physical Therapy Treatment  Patient Details  Name: Chris Lozano MRN: [id number] Date of Birth: 06/27/1940 Referring Provider:  Adin Hector, MD  Encounter Date: 08/21/2014      PT End of Session - 08/21/14 1759    Visit Number 3   Number of Visits 9   Date for PT Re-Evaluation 09/11/14   PT Start Time 1700   PT Stop Time 1745   PT Time Calculation (min) 45 min   Activity Tolerance Patient tolerated treatment well   Behavior During Therapy [facility] for tasks assessed/performed      Past Medical History  Diagnosis Date  . COPD (chronic obstructive pulmonary disease)   . CAD (coronary artery disease)   . Asthma     History reviewed. No pertinent past surgical history.  There were no vitals filed for this visit.  Visit Diagnosis:  Right-sided low back pain without sciatica      Subjective Assessment - 08/21/14 1752    Subjective pt reports he has felt the best he ever has in the past 3-4 years since his last session of PT.  pt reports he noticed it this morning when he was walking his dog and did not experience any pain which has not happend in the past few years.  pt also reports he forgot how to do his HEP and would like to review today.    Patient Stated Goals decrease pain with walking    Currently in Pain? Yes   Pain Score 1    Pain Location Back   Pain Orientation Left;Right         There ex: 6 min walk test:1330 ft without an increase in pain Bridges 3x10, pt required verbal cueing for correct breathing sequencing Quadruped with UE lift 2x10 Standing plank on table 3x20 sec Bilateral standing side plank on table 2x20 sec pavlov press with green brand 2x10 each side Scapular retractions x10, pt required min verbal and tactile cueing from preventing shrugging shoulder Rows with red band 2x10, pt required  verbal cues to keep shoulders leveled and not shrugging  Pt required min-mod verbal and tactile cueing for correct exercise technique and breathing sequencing                           PT Education - 08/21/14 1756    Education provided Yes   Education Details importance of HEP and how UE ther helps strengthen back musculature   Person(s) Educated Patient   Methods Explanation   Comprehension Verbalized understanding             PT Long Term Goals - 08/14/14 1840    PT LONG TERM GOAL #1   Title pt will be able to stand for ~20 min with 3/10 pain to do ADLs such as the dishes   Baseline prologned standing produces 8/10 pain    Time 4   Period Weeks   Status New   PT LONG TERM GOAL #2   Title pt will abe able ambulate at least 15 min with less than 4/10 pain    Baseline extended walking produces 8/10 pain   Time 4   Period Weeks   Status New   PT LONG TERM GOAL #3   Title pt will be able to sit with improved posture (decreased thoracic kyphosis  and increased lumbar lordosis) for at least 20 min to decrease pain    Baseline sitting with slumped posture    Time 4   Period Weeks   Status New               Plan - 08/21/14 1800    Clinical Impression Statement pt was able to complete 6 min walk test (1325f) without an increase in pain. pt is responding well to core stabilization exercises by experiencing a decrease in R LBP with ther ex and gait.  pt would benefit from skilled PT servies to improve remaining deficits.    Pt will benefit from skilled therapeutic intervention in order to improve on the following deficits Difficulty walking;Pain;Decreased activity tolerance;Postural dysfunction;Decreased strength;Decreased range of motion;Hypomobility   Rehab Potential Good   PT Frequency 2x / week   PT Duration 4 weeks   PT Treatment/Interventions Aquatic Therapy;Moist Heat;Functional mobility training;Therapeutic activities;Therapeutic  exercise;Patient/family education;Neuromuscular re-education;Manual techniques;Electrical Stimulation;Dry needling;Traction   PT Next Visit Plan progress HEP        Problem List There are no active problems to display for this patient.  JRenford Dills SPT JRenford Dills7/13/2016, 6:07 PM This entire session was performed under direct supervision and direction of a licensed therapist/therapist assistant . I have personally read, edited and approve of the note as written. AGorden Harms Tortorici, PT, DPT #[id number] [place] [facility] TrustSERVICES [address] StreetRBelle [place] [address]Phone: [phone]  Fax:  [phone]

## 2014-08-27 ENCOUNTER — Ambulatory Visit: Payer: PPO

## 2014-08-27 DIAGNOSIS — M545 Low back pain, unspecified: Secondary | ICD-10-CM

## 2014-08-27 NOTE — Therapy (Signed)
[place] MAIN [facility] SERVICES [address] [place], [place], [address] Phone: [phone]   Fax:  [phone]  Physical Therapy Treatment  Patient Details  Name: Chris Lozano MRN: [id number] Date of Birth: 10-03-1940 Referring Provider:  Adin Hector, MD  Encounter Date: 08/27/2014      PT End of Session - 08/27/14 1401    Visit Number 4   Number of Visits 9   Date for PT Re-Evaluation 09/11/14   PT Start Time 1300   PT Stop Time 1345   PT Time Calculation (min) 45 min   Activity Tolerance Patient tolerated treatment well   Behavior During Therapy [facility] for tasks assessed/performed      Past Medical History  Diagnosis Date  . COPD (chronic obstructive pulmonary disease)   . CAD (coronary artery disease)   . Asthma     No past surgical history on file.  There were no vitals filed for this visit.  Visit Diagnosis:  Right-sided low back pain without sciatica      Subjective Assessment - 08/27/14 1359    Subjective pt reports he feels well and has not had pain since his last session. pt relates he has been using the heel lift but not performing his HEP.    Patient Stated Goals decrease pain with walking    Currently in Pain? No/denies   Pain Score 0-No pain        There ex: Bridges 2x10, pt required verbal cueing for correct breathing sequencing pavlov press with green brand 2x10 each side Scapular retractions x10, pt required min verbal and tactile cueing from preventing shrugging shoulder Rows with red band 2x10, pt required verbal cues to keep shoulders leveled and not shrugging Cervical retractions 2x10 Bilateral Hip flexion/extension/abduction with yellow band 2x10 pt required min-mod verbal and tactile cueing for correct exercise technique and breathing sequencing     thera activity: Lifting cone from groundx10 Lifting 4kg below the knee to waist level x10 Lifting 15# below knee knee to waist level 10 Pt  required verbal and tactile cues for proper lifting technique.    Pt required min-mod verbal and tactile cueing for correct exercise technique and breathing sequencing                            PT Education - 08/27/14 1400    Education provided Yes   Education Details the importance of proper lifting mechanics and HEP   Person(s) Educated Patient   Methods Explanation   Comprehension Verbalized understanding             PT Long Term Goals - 08/14/14 1840    PT LONG TERM GOAL #1   Title pt will be able to stand for ~20 min with 3/10 pain to do ADLs such as the dishes   Baseline prologned standing produces 8/10 pain    Time 4   Period Weeks   Status New   PT LONG TERM GOAL #2   Title pt will abe able ambulate at least 15 min with less than 4/10 pain    Baseline extended walking produces 8/10 pain   Time 4   Period Weeks   Status New   PT LONG TERM GOAL #3   Title pt will be able to sit with improved posture (decreased thoracic kyphosis and increased lumbar lordosis) for at least 20 min to decrease pain    Baseline sitting with  slumped posture    Time 4   Period Weeks   Status New               Plan - 08/27/14 1415    Clinical Impression Statement pt continues to present with no pain in his low back and is able to finish session without an increase in pain.  pt requires minimal verbal cueing for correct exercise technique and with strength progression.  pt demonstrates improved lifting mechanics after reviewing proper technique.  pt would benfit from skilled PT services to imprve strength and functional activity tolerance.     Pt will benefit from skilled therapeutic intervention in order to improve on the following deficits Difficulty walking;Pain;Decreased activity tolerance;Postural dysfunction;Decreased strength;Decreased range of motion;Hypomobility   Rehab Potential Good   PT Frequency 2x / week   PT Duration 4 weeks   PT  Treatment/Interventions Aquatic Therapy;Moist Heat;Functional mobility training;Therapeutic activities;Therapeutic exercise;Patient/family education;Neuromuscular re-education;Manual techniques;Electrical Stimulation;Dry needling;Traction   PT Next Visit Plan progress HEP        Problem List There are no active problems to display for this patient.  Renford Dills, SPT Renford Dills 08/27/2014, 2:19 PM This entire session was performed under direct supervision and direction of a licensed therapist/therapist assistant . I have personally read, edited and approve of the note as written. Gorden Harms. Tortorici, PT, DPT [phone]  [place] MAIN [facility] SERVICES [address] [place], [place], [address] Phone: [phone]   Fax:  [phone]

## 2014-08-29 ENCOUNTER — Ambulatory Visit: Payer: PPO

## 2014-08-29 DIAGNOSIS — M545 Low back pain, unspecified: Secondary | ICD-10-CM

## 2014-08-29 NOTE — Patient Instructions (Signed)
[email or web address] Bridges 2x10 Clamshells 2x10 Quadruped alternate UE x10 Pavlov press with greed band 2x10 Shoulder rows with red band 2x10

## 2014-08-29 NOTE — Therapy (Addendum)
[place] MAIN [facility] SERVICES [address] [place], [place], [address] Phone: [phone]   Fax:  [phone]  Physical Therapy Treatment/Discharge summary  Patient Details  Name: Chris Lozano MRN: [id number] Date of Birth: [birth year] Referring Provider:  Jeri Cos, MD  Encounter Date: 08/29/2014      PT End of Session - 08/29/14 1729    Visit Number 5   Number of Visits 9   Date for PT Re-Evaluation 09/11/14   PT Start Time 1300   PT Stop Time 1345   PT Time Calculation (min) 45 min   Activity Tolerance Patient tolerated treatment well   Behavior During Therapy [facility] for tasks assessed/performed      Past Medical History  Diagnosis Date  . COPD (chronic obstructive pulmonary disease)   . CAD (coronary artery disease)   . Asthma     History reviewed. No pertinent past surgical history.  There were no vitals filed for this visit.  Visit Diagnosis:  Right-sided low back pain without sciatica      Subjective Assessment - 08/29/14 1726    Subjective pt relates he feels well and continues to have no pain and is able to take out the trash without exeriencing pain.    Patient Stated Goals decrease pain with walking    Currently in Pain? No/denies   Pain Score 0-No pain       therex: Bridges 2x10 Bilateral Clamshells 2x10 Quadruped with alternating UE x10 Pavlov press with greed band 2x10 Shoulder rows with red band 2x10 Pt required min verbal and tactile cues for correct technique and pace   6 min walk test: 1370 ft Prior 6 min walk test: 1330 ft                           PT Education - 08/29/14 1729    Education provided Yes   Education Details to perform HEP at least 4x/week to maintain gains made in PT, plan of care   Person(s) Educated Patient   Methods Explanation   Comprehension Verbalized understanding             PT Long Term Goals - 08/29/14 1733    PT LONG TERM GOAL #1   Title pt will be able to stand for ~20 min with 3/10 pain to do ADLs such as the dishes   Baseline patient reports no pain while washing the dishes   Time 4   Period Weeks   Status Achieved   PT LONG TERM GOAL #2   Title pt will abe able ambulate at least 15 min with less than 4/10 pain    Baseline pt reports no pain while walking his dog for ~20 min   Time 4   Period Weeks   Status Achieved   PT LONG TERM GOAL #3   Title pt will be able to sit with improved posture (decreased thoracic kyphosis and increased lumbar lordosis) for at least 20 min to decrease pain    Baseline pt does not experience any pain in sitting    Time 4   Period Weeks   Status Achieved               Plan - 08/29/14 1730    Clinical Impression Statement pt has made significant gains in PT and has met all his goals at this time.  pt is able to peform ADLs and IADLs without pain.  pt will  be discharged with HEP to maintain gains made in PT at this time due to meeting goals and resolution of back pain.     Pt will benefit from skilled therapeutic intervention in order to improve on the following deficits Difficulty walking;Pain;Decreased activity tolerance;Postural dysfunction;Decreased strength;Decreased range of motion;Hypomobility   Rehab Potential Good   PT Frequency 2x / week   PT Duration 4 weeks   PT Treatment/Interventions Aquatic Therapy;Moist Heat;Functional mobility training;Therapeutic activities;Therapeutic exercise;Patient/family education;Neuromuscular re-education;Manual techniques;Electrical Stimulation;Dry needling;Traction          G-Codes - 26-Sep-2014 1736    Functional Assessment Tool Used 6 min walk test, posture, pain scale, clinical impression    Functional Limitation Mobility: Walking and moving around   Mobility: Walking and Moving Around Current Status [phone]) At least 1 percent but less than 20 percent impaired, limited or restricted   Mobility: Walking and Moving Around Goal  Status [phone]) At least 1 percent but less than 20 percent impaired, limited or restricted   Mobility: Walking and Moving Around Discharge Status [phone]) At least 1 percent but less than 20 percent impaired, limited or restricted      Problem List There are no active problems to display for this patient.  [place] 09-26-14, 5:48 PM  This entire session was performed under direct supervision and direction of a licensed therapist/therapist assistant . I have personally read, edited and approve of the note as written. Gorden Harms. Tortorici, PT, DPT [phone]   [place] MAIN [facility] SERVICES [address] [place], [place], [address] Phone: [phone]   Fax:  [phone]

## 2014-09-03 ENCOUNTER — Ambulatory Visit: Payer: PPO

## 2014-09-17 ENCOUNTER — Ambulatory Visit (INDEPENDENT_AMBULATORY_CARE_PROVIDER_SITE_OTHER): Payer: PPO | Admitting: Urology

## 2014-09-17 ENCOUNTER — Encounter: Payer: Self-pay | Admitting: Urology

## 2014-09-17 VITALS — BP 103/72 | HR 80 | Ht 66.5 in | Wt 179.9 lb

## 2014-09-17 DIAGNOSIS — N3281 Overactive bladder: Secondary | ICD-10-CM | POA: Diagnosis not present

## 2014-09-17 DIAGNOSIS — N529 Male erectile dysfunction, unspecified: Secondary | ICD-10-CM

## 2014-09-17 LAB — BLADDER SCAN AMB NON-IMAGING: SCAN RESULT: 50

## 2014-09-17 NOTE — Progress Notes (Addendum)
09/17/2014 3:31 PM   Chris Lozano 1940-05-08 [id number]  Referring provider: Adin Hector, MD [place], [place] [address]  Chief Complaint  Patient presents with  . Over Active Bladder    patient states self referral    BOF:BPZWCHE has 2 complaints 1 is similar urgency and frequency of urination with incontinence if not able get to the bathroom in time. Has had no procedures done on his prostate had a catheter only once in his life with no trauma. This is been going on for some time. Of interesting note is the has nocturia 1. Otherwise he isn't as no decrease in his stream but does have small amounts of urine. When he does have a a nonurgent stream it is a normal size and force is normal. Secondary complaint erectile dysfunction for some time. His wife is desirous of sex. Unfortunately the patient has quite a large abdominal protuberance with an umbilical hernia present. This causes him a increase in the mons pubic size so the patient has perception of a small penis. He's tried a direct Tylenol medications but was afraid to use them more than once because of fear of loss of vision from bladder. He also has tried a vacuum pump without success but he did not utilize it in the proper manner. Did not use any Of Lubricant to Create a Seal between the Pump and His Penis. Several Erectile Dysfunction Is Difficult to Assess. Think Stopping Smoking Would Be Helpful tohim. His PSAs have been normal over the years and then every year. He's also had colonoscopies he is never been on medication for his overactive bladder HPI   PMH: Past Medical History  Diagnosis Date  . COPD (chronic obstructive pulmonary disease)   . CAD (coronary artery disease)   . Asthma     Surgical History: Past Surgical History  Procedure Laterality Date  . Gallbladder surgery    . Tonsillectomy    . Adenoidectomy    . Cardiac catheterization      Home Medications:    Medication List         This list is accurate as of: 09/17/14  3:31 PM.  Always use your most recent med list.               ALBUTEROL IN  Inhale into the lungs.     aspirin 325 MG EC tablet  Take 325 mg by mouth daily.     aspirin EC 81 MG tablet  Take 81 mg by mouth daily.     atorvastatin 80 MG tablet  Commonly known as:  LIPITOR  Take 80 mg by mouth daily.     budesonide 180 MCG/ACT inhaler  Commonly known as:  PULMICORT  Inhale into the lungs 2 (two) times daily.     budesonide-formoterol 160-4.5 MCG/ACT inhaler  Commonly known as:  SYMBICORT  Inhale 2 puffs into the lungs 2 (two) times daily.     DUREZOL 0.05 % Emul  Generic drug:  Difluprednate  Apply to eye.     hydrochlorothiazide 12.5 MG capsule  Commonly known as:  MICROZIDE  Take 12.5 mg by mouth daily.     ranitidine 150 MG capsule  Commonly known as:  ZANTAC  Take 150 mg by mouth 2 (two) times daily.        Allergies:  Allergies  Allergen Reactions  . Ciprocinonide [Fluocinolone]     Family History: Family History  Problem Relation Age of Onset  .  Kidney disease Neg Hx   . Prostate cancer Neg Hx   . Heart disease      Social History:  reports that he has been smoking.  He does not have any smokeless tobacco history on file. He reports that he drinks alcohol. He reports that he does not use illicit drugs.  ROS: UROLOGY Frequent Urination?: Yes Hard to postpone urination?: Yes Burning/pain with urination?: No Get up at night to urinate?: Yes Leakage of urine?: Yes Urine stream starts and stops?: No Trouble starting stream?: No Do you have to strain to urinate?: No Blood in urine?: No Urinary tract infection?: No Sexually transmitted disease?: No Injury to kidneys or bladder?: No Painful intercourse?: No Weak stream?: No Erection problems?: No Penile pain?: No  Gastrointestinal Nausea?: No Vomiting?: No Indigestion/heartburn?: No Diarrhea?: No Constipation?: No  Constitutional Fever: No Night  sweats?: No Weight loss?: No Fatigue?: No  Skin Skin rash/lesions?: No Itching?: No  Eyes Blurred vision?: No Double vision?: No  Ears/Nose/Throat Sore throat?: No Sinus problems?: No  Hematologic/Lymphatic Swollen glands?: No Easy bruising?: No  Cardiovascular Leg swelling?: No Chest pain?: No  Respiratory Cough?: No Shortness of breath?: Yes  Endocrine Excessive thirst?: No  Musculoskeletal Back pain?: No Joint pain?: No  Neurological Headaches?: No Dizziness?: No  Psychologic Depression?: No Anxiety?: No  Physical Exam: BP 103/72 mmHg  Pulse 80  Ht 5' 6.5" (1.689 m)  Wt 179 lb 14.4 oz (81.602 kg)  BMI 28.60 kg/m2  Constitutional:  Alert and oriented, No acute distress. HEENT: [place] AT, moist mucus membranes.  Trachea midline, no masses. Cardiovascular: No clubbing, cyanosis, or edema. Respiratory: Normal respiratory effort, no increased work of breathing. GI: Abdomen is soft, nontender, nondistended, no abdominal masses GU: No CVA tenderness. Uncircumcised penis with mons pubic excess. Prostate small firm grade 1 over 4 no nodules. No rectal nodules. PSAs have been normal. Skin: No rashes, bruises or suspicious lesions. Lymph: No cervical or inguinal adenopathy. Neurologic: Grossly intact, no focal deficits, moving all 4 extremities. Psychiatric: Normal mood and affect.  Laboratory Data: Lab Results  Component Value Date   WBC 9.2 01/17/2012   HGB 15.2 01/17/2012   HCT 46.2 01/17/2012   MCV 96 01/17/2012   PLT 100* 01/17/2012    Lab Results  Component Value Date   CREATININE 0.84 01/18/2012    No results found for: PSA  No results found for: TESTOSTERONE  No results found for: HGBA1C  Urinalysis    Component Value Date/Time   COLORURINE Amber 01/14/2012 2355   APPEARANCEUR Hazy 01/14/2012 2355   LABSPEC 1.016 01/14/2012 2355   PHURINE 5.0 01/14/2012 2355   GLUCOSEU Negative 01/14/2012 2355   HGBUR 1+ 01/14/2012 2355    BILIRUBINUR Negative 01/14/2012 2355   KETONESUR Negative 01/14/2012 2355   PROTEINUR 30 mg/dL 01/14/2012 2355   NITRITE Negative 01/14/2012 2355   LEUKOCYTESUR Negative 01/14/2012 2355    Pertinent Imaging: Postvoid residual of 50 mL  Assessment and Plan: erectile dysfunction treat with sildenafil-Viagra 5sex0 mg when necessary 4 SEX. #2 overactive bladder-samples of Toviaz 8 mg 1 daily  Problem List Items Addressed This Visit    None    Visit Diagnoses    OAB (overactive bladder)    -  Primary    Relevant Orders    BLADDER SCAN AMB NON-IMAGING (Completed)    Erectile dysfunction associated with vasculopathy        Relevant Medications    aspirin EC 81 MG tablet  Return in about 4 weeks (around 10/15/2014) for evaluation meds.  Collier Flowers, [facility] [address], [place], [place] [address] [phone]

## 2014-10-11 ENCOUNTER — Other Ambulatory Visit: Payer: Self-pay

## 2014-10-11 DIAGNOSIS — N3281 Overactive bladder: Secondary | ICD-10-CM

## 2014-10-11 MED ORDER — FESOTERODINE FUMARATE ER 8 MG PO TB24: 8.0000 mg | ORAL_TABLET | Freq: Every day | ORAL | Status: DC

## 2014-10-11 NOTE — Progress Notes (Signed)
Pt called asking for a script for toviaz. Per Dr. Guinevere Ferrari last note Chris Lozano was called into pt pharmacy.

## 2015-02-26 DIAGNOSIS — J449 Chronic obstructive pulmonary disease, unspecified: Secondary | ICD-10-CM | POA: Diagnosis not present

## 2015-02-26 DIAGNOSIS — G4733 Obstructive sleep apnea (adult) (pediatric): Secondary | ICD-10-CM | POA: Diagnosis not present

## 2015-02-26 DIAGNOSIS — R0602 Shortness of breath: Secondary | ICD-10-CM | POA: Diagnosis not present

## 2015-02-26 DIAGNOSIS — R5381 Other malaise: Secondary | ICD-10-CM | POA: Diagnosis not present

## 2015-02-26 DIAGNOSIS — J45998 Other asthma: Secondary | ICD-10-CM | POA: Diagnosis not present

## 2015-02-26 DIAGNOSIS — J438 Other emphysema: Secondary | ICD-10-CM | POA: Diagnosis not present

## 2015-03-28 DIAGNOSIS — G4733 Obstructive sleep apnea (adult) (pediatric): Secondary | ICD-10-CM | POA: Diagnosis not present

## 2015-03-28 DIAGNOSIS — I2510 Atherosclerotic heart disease of native coronary artery without angina pectoris: Secondary | ICD-10-CM | POA: Diagnosis not present

## 2015-03-28 DIAGNOSIS — Z125 Encounter for screening for malignant neoplasm of prostate: Secondary | ICD-10-CM | POA: Diagnosis not present

## 2015-03-28 DIAGNOSIS — E782 Mixed hyperlipidemia: Secondary | ICD-10-CM | POA: Diagnosis not present

## 2015-03-29 DIAGNOSIS — G4733 Obstructive sleep apnea (adult) (pediatric): Secondary | ICD-10-CM | POA: Diagnosis not present

## 2015-03-29 DIAGNOSIS — R0602 Shortness of breath: Secondary | ICD-10-CM | POA: Diagnosis not present

## 2015-03-29 DIAGNOSIS — J438 Other emphysema: Secondary | ICD-10-CM | POA: Diagnosis not present

## 2015-03-29 DIAGNOSIS — R5381 Other malaise: Secondary | ICD-10-CM | POA: Diagnosis not present

## 2015-03-29 DIAGNOSIS — J45998 Other asthma: Secondary | ICD-10-CM | POA: Diagnosis not present

## 2015-03-29 DIAGNOSIS — J449 Chronic obstructive pulmonary disease, unspecified: Secondary | ICD-10-CM | POA: Diagnosis not present

## 2015-04-01 DIAGNOSIS — I739 Peripheral vascular disease, unspecified: Secondary | ICD-10-CM | POA: Diagnosis not present

## 2015-04-01 DIAGNOSIS — F3342 Major depressive disorder, recurrent, in full remission: Secondary | ICD-10-CM | POA: Diagnosis not present

## 2015-04-01 DIAGNOSIS — J449 Chronic obstructive pulmonary disease, unspecified: Secondary | ICD-10-CM | POA: Diagnosis not present

## 2015-04-01 DIAGNOSIS — E782 Mixed hyperlipidemia: Secondary | ICD-10-CM | POA: Diagnosis not present

## 2015-04-01 DIAGNOSIS — I2510 Atherosclerotic heart disease of native coronary artery without angina pectoris: Secondary | ICD-10-CM | POA: Diagnosis not present

## 2015-04-01 DIAGNOSIS — N400 Enlarged prostate without lower urinary tract symptoms: Secondary | ICD-10-CM | POA: Diagnosis not present

## 2015-04-01 DIAGNOSIS — G4733 Obstructive sleep apnea (adult) (pediatric): Secondary | ICD-10-CM | POA: Diagnosis not present

## 2015-04-01 DIAGNOSIS — Z23 Encounter for immunization: Secondary | ICD-10-CM | POA: Diagnosis not present

## 2015-04-01 DIAGNOSIS — D72828 Other elevated white blood cell count: Secondary | ICD-10-CM | POA: Diagnosis not present

## 2015-04-26 DIAGNOSIS — R0602 Shortness of breath: Secondary | ICD-10-CM | POA: Diagnosis not present

## 2015-04-26 DIAGNOSIS — J438 Other emphysema: Secondary | ICD-10-CM | POA: Diagnosis not present

## 2015-04-26 DIAGNOSIS — G4733 Obstructive sleep apnea (adult) (pediatric): Secondary | ICD-10-CM | POA: Diagnosis not present

## 2015-04-26 DIAGNOSIS — J449 Chronic obstructive pulmonary disease, unspecified: Secondary | ICD-10-CM | POA: Diagnosis not present

## 2015-04-26 DIAGNOSIS — R5381 Other malaise: Secondary | ICD-10-CM | POA: Diagnosis not present

## 2015-04-26 DIAGNOSIS — J45998 Other asthma: Secondary | ICD-10-CM | POA: Diagnosis not present

## 2015-04-28 DIAGNOSIS — I2510 Atherosclerotic heart disease of native coronary artery without angina pectoris: Secondary | ICD-10-CM | POA: Diagnosis not present

## 2015-04-28 DIAGNOSIS — Z122 Encounter for screening for malignant neoplasm of respiratory organs: Secondary | ICD-10-CM | POA: Diagnosis not present

## 2015-04-28 DIAGNOSIS — F17210 Nicotine dependence, cigarettes, uncomplicated: Secondary | ICD-10-CM | POA: Diagnosis not present

## 2015-04-28 DIAGNOSIS — Z87891 Personal history of nicotine dependence: Secondary | ICD-10-CM | POA: Diagnosis not present

## 2015-04-28 DIAGNOSIS — J449 Chronic obstructive pulmonary disease, unspecified: Secondary | ICD-10-CM | POA: Diagnosis not present

## 2015-04-30 DIAGNOSIS — J069 Acute upper respiratory infection, unspecified: Secondary | ICD-10-CM | POA: Diagnosis not present

## 2015-04-30 DIAGNOSIS — I2510 Atherosclerotic heart disease of native coronary artery without angina pectoris: Secondary | ICD-10-CM | POA: Diagnosis not present

## 2015-04-30 DIAGNOSIS — J438 Other emphysema: Secondary | ICD-10-CM | POA: Diagnosis not present

## 2015-05-19 DIAGNOSIS — J9811 Atelectasis: Secondary | ICD-10-CM | POA: Diagnosis not present

## 2015-05-19 DIAGNOSIS — Q2730 Arteriovenous malformation, site unspecified: Secondary | ICD-10-CM | POA: Diagnosis not present

## 2015-05-19 DIAGNOSIS — Q223 Other congenital malformations of pulmonary valve: Secondary | ICD-10-CM | POA: Diagnosis not present

## 2015-05-19 DIAGNOSIS — R918 Other nonspecific abnormal finding of lung field: Secondary | ICD-10-CM | POA: Diagnosis not present

## 2015-05-27 DIAGNOSIS — J45998 Other asthma: Secondary | ICD-10-CM | POA: Diagnosis not present

## 2015-05-27 DIAGNOSIS — R0602 Shortness of breath: Secondary | ICD-10-CM | POA: Diagnosis not present

## 2015-05-27 DIAGNOSIS — G4733 Obstructive sleep apnea (adult) (pediatric): Secondary | ICD-10-CM | POA: Diagnosis not present

## 2015-05-27 DIAGNOSIS — J438 Other emphysema: Secondary | ICD-10-CM | POA: Diagnosis not present

## 2015-05-27 DIAGNOSIS — R5381 Other malaise: Secondary | ICD-10-CM | POA: Diagnosis not present

## 2015-05-27 DIAGNOSIS — J449 Chronic obstructive pulmonary disease, unspecified: Secondary | ICD-10-CM | POA: Diagnosis not present

## 2015-06-18 DIAGNOSIS — Q2730 Arteriovenous malformation, site unspecified: Secondary | ICD-10-CM | POA: Diagnosis not present

## 2015-06-18 DIAGNOSIS — I280 Arteriovenous fistula of pulmonary vessels: Secondary | ICD-10-CM | POA: Diagnosis not present

## 2015-06-26 DIAGNOSIS — R5381 Other malaise: Secondary | ICD-10-CM | POA: Diagnosis not present

## 2015-06-26 DIAGNOSIS — G4733 Obstructive sleep apnea (adult) (pediatric): Secondary | ICD-10-CM | POA: Diagnosis not present

## 2015-06-26 DIAGNOSIS — J45998 Other asthma: Secondary | ICD-10-CM | POA: Diagnosis not present

## 2015-06-26 DIAGNOSIS — R0602 Shortness of breath: Secondary | ICD-10-CM | POA: Diagnosis not present

## 2015-06-26 DIAGNOSIS — J449 Chronic obstructive pulmonary disease, unspecified: Secondary | ICD-10-CM | POA: Diagnosis not present

## 2015-06-26 DIAGNOSIS — J438 Other emphysema: Secondary | ICD-10-CM | POA: Diagnosis not present

## 2015-07-03 DIAGNOSIS — H6123 Impacted cerumen, bilateral: Secondary | ICD-10-CM | POA: Diagnosis not present

## 2015-07-03 DIAGNOSIS — H6060 Unspecified chronic otitis externa, unspecified ear: Secondary | ICD-10-CM | POA: Diagnosis not present

## 2015-07-27 DIAGNOSIS — G4733 Obstructive sleep apnea (adult) (pediatric): Secondary | ICD-10-CM | POA: Diagnosis not present

## 2015-07-27 DIAGNOSIS — R0602 Shortness of breath: Secondary | ICD-10-CM | POA: Diagnosis not present

## 2015-07-27 DIAGNOSIS — J438 Other emphysema: Secondary | ICD-10-CM | POA: Diagnosis not present

## 2015-07-27 DIAGNOSIS — R5381 Other malaise: Secondary | ICD-10-CM | POA: Diagnosis not present

## 2015-07-27 DIAGNOSIS — J449 Chronic obstructive pulmonary disease, unspecified: Secondary | ICD-10-CM | POA: Diagnosis not present

## 2015-07-27 DIAGNOSIS — J45998 Other asthma: Secondary | ICD-10-CM | POA: Diagnosis not present

## 2015-07-28 DIAGNOSIS — H40153 Residual stage of open-angle glaucoma, bilateral: Secondary | ICD-10-CM | POA: Diagnosis not present

## 2015-08-15 DIAGNOSIS — Z86010 Personal history of colonic polyps: Secondary | ICD-10-CM | POA: Diagnosis not present

## 2015-08-26 DIAGNOSIS — R5381 Other malaise: Secondary | ICD-10-CM | POA: Diagnosis not present

## 2015-08-26 DIAGNOSIS — J438 Other emphysema: Secondary | ICD-10-CM | POA: Diagnosis not present

## 2015-08-26 DIAGNOSIS — J45998 Other asthma: Secondary | ICD-10-CM | POA: Diagnosis not present

## 2015-08-26 DIAGNOSIS — G4733 Obstructive sleep apnea (adult) (pediatric): Secondary | ICD-10-CM | POA: Diagnosis not present

## 2015-08-26 DIAGNOSIS — R0602 Shortness of breath: Secondary | ICD-10-CM | POA: Diagnosis not present

## 2015-08-26 DIAGNOSIS — J449 Chronic obstructive pulmonary disease, unspecified: Secondary | ICD-10-CM | POA: Diagnosis not present

## 2015-09-19 DIAGNOSIS — I2510 Atherosclerotic heart disease of native coronary artery without angina pectoris: Secondary | ICD-10-CM | POA: Diagnosis not present

## 2015-09-19 DIAGNOSIS — E782 Mixed hyperlipidemia: Secondary | ICD-10-CM | POA: Diagnosis not present

## 2015-09-19 DIAGNOSIS — I739 Peripheral vascular disease, unspecified: Secondary | ICD-10-CM | POA: Diagnosis not present

## 2015-09-26 DIAGNOSIS — G4733 Obstructive sleep apnea (adult) (pediatric): Secondary | ICD-10-CM | POA: Diagnosis not present

## 2015-09-26 DIAGNOSIS — R0602 Shortness of breath: Secondary | ICD-10-CM | POA: Diagnosis not present

## 2015-09-26 DIAGNOSIS — R5381 Other malaise: Secondary | ICD-10-CM | POA: Diagnosis not present

## 2015-09-26 DIAGNOSIS — J438 Other emphysema: Secondary | ICD-10-CM | POA: Diagnosis not present

## 2015-09-26 DIAGNOSIS — J45998 Other asthma: Secondary | ICD-10-CM | POA: Diagnosis not present

## 2015-09-26 DIAGNOSIS — J449 Chronic obstructive pulmonary disease, unspecified: Secondary | ICD-10-CM | POA: Diagnosis not present

## 2015-10-01 DIAGNOSIS — I280 Arteriovenous fistula of pulmonary vessels: Secondary | ICD-10-CM | POA: Diagnosis not present

## 2015-10-01 DIAGNOSIS — N400 Enlarged prostate without lower urinary tract symptoms: Secondary | ICD-10-CM | POA: Diagnosis not present

## 2015-10-01 DIAGNOSIS — F3342 Major depressive disorder, recurrent, in full remission: Secondary | ICD-10-CM | POA: Diagnosis not present

## 2015-10-01 DIAGNOSIS — G4733 Obstructive sleep apnea (adult) (pediatric): Secondary | ICD-10-CM | POA: Diagnosis not present

## 2015-10-01 DIAGNOSIS — I2510 Atherosclerotic heart disease of native coronary artery without angina pectoris: Secondary | ICD-10-CM | POA: Diagnosis not present

## 2015-10-01 DIAGNOSIS — I739 Peripheral vascular disease, unspecified: Secondary | ICD-10-CM | POA: Diagnosis not present

## 2015-10-01 DIAGNOSIS — E782 Mixed hyperlipidemia: Secondary | ICD-10-CM | POA: Diagnosis not present

## 2015-10-01 DIAGNOSIS — D6489 Other specified anemias: Secondary | ICD-10-CM | POA: Diagnosis not present

## 2015-10-01 DIAGNOSIS — J438 Other emphysema: Secondary | ICD-10-CM | POA: Diagnosis not present

## 2015-10-27 DIAGNOSIS — R0602 Shortness of breath: Secondary | ICD-10-CM | POA: Diagnosis not present

## 2015-10-27 DIAGNOSIS — J45998 Other asthma: Secondary | ICD-10-CM | POA: Diagnosis not present

## 2015-10-27 DIAGNOSIS — J449 Chronic obstructive pulmonary disease, unspecified: Secondary | ICD-10-CM | POA: Diagnosis not present

## 2015-10-27 DIAGNOSIS — J438 Other emphysema: Secondary | ICD-10-CM | POA: Diagnosis not present

## 2015-10-27 DIAGNOSIS — G4733 Obstructive sleep apnea (adult) (pediatric): Secondary | ICD-10-CM | POA: Diagnosis not present

## 2015-10-27 DIAGNOSIS — R5381 Other malaise: Secondary | ICD-10-CM | POA: Diagnosis not present

## 2015-11-19 DIAGNOSIS — L0292 Furuncle, unspecified: Secondary | ICD-10-CM | POA: Diagnosis not present

## 2015-11-19 DIAGNOSIS — L821 Other seborrheic keratosis: Secondary | ICD-10-CM | POA: Diagnosis not present

## 2015-11-25 DIAGNOSIS — H40153 Residual stage of open-angle glaucoma, bilateral: Secondary | ICD-10-CM | POA: Diagnosis not present

## 2015-12-03 DIAGNOSIS — G4733 Obstructive sleep apnea (adult) (pediatric): Secondary | ICD-10-CM | POA: Diagnosis not present

## 2015-12-03 DIAGNOSIS — I2510 Atherosclerotic heart disease of native coronary artery without angina pectoris: Secondary | ICD-10-CM | POA: Diagnosis not present

## 2015-12-03 DIAGNOSIS — J439 Emphysema, unspecified: Secondary | ICD-10-CM | POA: Diagnosis not present

## 2015-12-03 DIAGNOSIS — G4730 Sleep apnea, unspecified: Secondary | ICD-10-CM | POA: Diagnosis not present

## 2015-12-03 DIAGNOSIS — Z79899 Other long term (current) drug therapy: Secondary | ICD-10-CM | POA: Diagnosis not present

## 2015-12-03 DIAGNOSIS — Z9981 Dependence on supplemental oxygen: Secondary | ICD-10-CM | POA: Diagnosis not present

## 2015-12-03 DIAGNOSIS — J449 Chronic obstructive pulmonary disease, unspecified: Secondary | ICD-10-CM | POA: Diagnosis not present

## 2015-12-03 DIAGNOSIS — Z7951 Long term (current) use of inhaled steroids: Secondary | ICD-10-CM | POA: Diagnosis not present

## 2015-12-03 DIAGNOSIS — F17200 Nicotine dependence, unspecified, uncomplicated: Secondary | ICD-10-CM | POA: Diagnosis not present

## 2015-12-03 DIAGNOSIS — Z7982 Long term (current) use of aspirin: Secondary | ICD-10-CM | POA: Diagnosis not present

## 2016-01-12 DIAGNOSIS — Z86010 Personal history of colonic polyps: Secondary | ICD-10-CM | POA: Diagnosis not present

## 2016-01-12 DIAGNOSIS — J438 Other emphysema: Secondary | ICD-10-CM | POA: Diagnosis not present

## 2016-01-12 DIAGNOSIS — Z8669 Personal history of other diseases of the nervous system and sense organs: Secondary | ICD-10-CM | POA: Diagnosis not present

## 2016-01-12 DIAGNOSIS — I280 Arteriovenous fistula of pulmonary vessels: Secondary | ICD-10-CM | POA: Diagnosis not present

## 2016-03-17 DIAGNOSIS — M545 Low back pain: Secondary | ICD-10-CM | POA: Diagnosis not present

## 2016-03-17 DIAGNOSIS — N390 Urinary tract infection, site not specified: Secondary | ICD-10-CM | POA: Diagnosis not present

## 2016-03-26 DIAGNOSIS — D6489 Other specified anemias: Secondary | ICD-10-CM | POA: Diagnosis not present

## 2016-03-26 DIAGNOSIS — E782 Mixed hyperlipidemia: Secondary | ICD-10-CM | POA: Diagnosis not present

## 2016-03-26 DIAGNOSIS — I739 Peripheral vascular disease, unspecified: Secondary | ICD-10-CM | POA: Diagnosis not present

## 2016-03-30 DIAGNOSIS — H40153 Residual stage of open-angle glaucoma, bilateral: Secondary | ICD-10-CM | POA: Diagnosis not present

## 2016-04-02 DIAGNOSIS — I700 Atherosclerosis of aorta: Secondary | ICD-10-CM | POA: Diagnosis not present

## 2016-04-02 DIAGNOSIS — D508 Other iron deficiency anemias: Secondary | ICD-10-CM | POA: Diagnosis not present

## 2016-04-02 DIAGNOSIS — I280 Arteriovenous fistula of pulmonary vessels: Secondary | ICD-10-CM | POA: Diagnosis not present

## 2016-04-02 DIAGNOSIS — Z0000 Encounter for general adult medical examination without abnormal findings: Secondary | ICD-10-CM | POA: Diagnosis not present

## 2016-04-02 DIAGNOSIS — F3342 Major depressive disorder, recurrent, in full remission: Secondary | ICD-10-CM | POA: Diagnosis not present

## 2016-04-02 DIAGNOSIS — Z125 Encounter for screening for malignant neoplasm of prostate: Secondary | ICD-10-CM | POA: Diagnosis not present

## 2016-04-02 DIAGNOSIS — G4733 Obstructive sleep apnea (adult) (pediatric): Secondary | ICD-10-CM | POA: Diagnosis not present

## 2016-04-02 DIAGNOSIS — D649 Anemia, unspecified: Secondary | ICD-10-CM | POA: Diagnosis not present

## 2016-04-02 DIAGNOSIS — E782 Mixed hyperlipidemia: Secondary | ICD-10-CM | POA: Diagnosis not present

## 2016-04-02 DIAGNOSIS — N400 Enlarged prostate without lower urinary tract symptoms: Secondary | ICD-10-CM | POA: Diagnosis not present

## 2016-04-02 DIAGNOSIS — I739 Peripheral vascular disease, unspecified: Secondary | ICD-10-CM | POA: Diagnosis not present

## 2016-04-02 DIAGNOSIS — J438 Other emphysema: Secondary | ICD-10-CM | POA: Diagnosis not present

## 2016-04-02 DIAGNOSIS — I2510 Atherosclerotic heart disease of native coronary artery without angina pectoris: Secondary | ICD-10-CM | POA: Diagnosis not present

## 2016-04-08 DIAGNOSIS — D508 Other iron deficiency anemias: Secondary | ICD-10-CM | POA: Diagnosis not present

## 2016-04-13 DIAGNOSIS — G4733 Obstructive sleep apnea (adult) (pediatric): Secondary | ICD-10-CM | POA: Diagnosis not present

## 2016-04-13 DIAGNOSIS — R5381 Other malaise: Secondary | ICD-10-CM | POA: Diagnosis not present

## 2016-04-13 DIAGNOSIS — J45998 Other asthma: Secondary | ICD-10-CM | POA: Diagnosis not present

## 2016-04-13 DIAGNOSIS — R0602 Shortness of breath: Secondary | ICD-10-CM | POA: Diagnosis not present

## 2016-04-13 DIAGNOSIS — J438 Other emphysema: Secondary | ICD-10-CM | POA: Diagnosis not present

## 2016-04-13 DIAGNOSIS — J449 Chronic obstructive pulmonary disease, unspecified: Secondary | ICD-10-CM | POA: Diagnosis not present

## 2016-05-10 DIAGNOSIS — Z87891 Personal history of nicotine dependence: Secondary | ICD-10-CM | POA: Diagnosis not present

## 2016-05-18 DIAGNOSIS — Z122 Encounter for screening for malignant neoplasm of respiratory organs: Secondary | ICD-10-CM | POA: Diagnosis not present

## 2016-05-18 DIAGNOSIS — N400 Enlarged prostate without lower urinary tract symptoms: Secondary | ICD-10-CM | POA: Diagnosis not present

## 2016-05-18 DIAGNOSIS — Z8774 Personal history of (corrected) congenital malformations of heart and circulatory system: Secondary | ICD-10-CM | POA: Diagnosis not present

## 2016-05-19 DIAGNOSIS — M545 Low back pain: Secondary | ICD-10-CM | POA: Diagnosis not present

## 2016-06-22 DIAGNOSIS — R0902 Hypoxemia: Secondary | ICD-10-CM | POA: Diagnosis not present

## 2016-06-22 DIAGNOSIS — J438 Other emphysema: Secondary | ICD-10-CM | POA: Diagnosis not present

## 2016-06-22 DIAGNOSIS — G4733 Obstructive sleep apnea (adult) (pediatric): Secondary | ICD-10-CM | POA: Diagnosis not present

## 2016-06-23 DIAGNOSIS — R0902 Hypoxemia: Secondary | ICD-10-CM | POA: Diagnosis not present

## 2016-06-23 DIAGNOSIS — G4733 Obstructive sleep apnea (adult) (pediatric): Secondary | ICD-10-CM | POA: Diagnosis not present

## 2016-06-23 DIAGNOSIS — J449 Chronic obstructive pulmonary disease, unspecified: Secondary | ICD-10-CM | POA: Diagnosis not present

## 2016-06-23 DIAGNOSIS — R0602 Shortness of breath: Secondary | ICD-10-CM | POA: Diagnosis not present

## 2016-06-23 DIAGNOSIS — R5381 Other malaise: Secondary | ICD-10-CM | POA: Diagnosis not present

## 2016-06-23 DIAGNOSIS — J438 Other emphysema: Secondary | ICD-10-CM | POA: Diagnosis not present

## 2016-06-23 DIAGNOSIS — J45998 Other asthma: Secondary | ICD-10-CM | POA: Diagnosis not present

## 2016-07-07 DIAGNOSIS — Z0000 Encounter for general adult medical examination without abnormal findings: Secondary | ICD-10-CM | POA: Diagnosis not present

## 2016-07-24 DIAGNOSIS — J438 Other emphysema: Secondary | ICD-10-CM | POA: Diagnosis not present

## 2016-07-24 DIAGNOSIS — J45998 Other asthma: Secondary | ICD-10-CM | POA: Diagnosis not present

## 2016-07-24 DIAGNOSIS — J449 Chronic obstructive pulmonary disease, unspecified: Secondary | ICD-10-CM | POA: Diagnosis not present

## 2016-07-24 DIAGNOSIS — R5381 Other malaise: Secondary | ICD-10-CM | POA: Diagnosis not present

## 2016-07-24 DIAGNOSIS — R0902 Hypoxemia: Secondary | ICD-10-CM | POA: Diagnosis not present

## 2016-07-24 DIAGNOSIS — G4733 Obstructive sleep apnea (adult) (pediatric): Secondary | ICD-10-CM | POA: Diagnosis not present

## 2016-07-24 DIAGNOSIS — R0602 Shortness of breath: Secondary | ICD-10-CM | POA: Diagnosis not present

## 2016-07-27 DIAGNOSIS — H40153 Residual stage of open-angle glaucoma, bilateral: Secondary | ICD-10-CM | POA: Diagnosis not present

## 2016-07-29 DIAGNOSIS — J438 Other emphysema: Secondary | ICD-10-CM | POA: Diagnosis not present

## 2016-09-08 DIAGNOSIS — Z9989 Dependence on other enabling machines and devices: Secondary | ICD-10-CM | POA: Diagnosis not present

## 2016-09-08 DIAGNOSIS — I2510 Atherosclerotic heart disease of native coronary artery without angina pectoris: Secondary | ICD-10-CM | POA: Diagnosis not present

## 2016-09-08 DIAGNOSIS — R0602 Shortness of breath: Secondary | ICD-10-CM | POA: Diagnosis not present

## 2016-09-08 DIAGNOSIS — J449 Chronic obstructive pulmonary disease, unspecified: Secondary | ICD-10-CM | POA: Diagnosis not present

## 2016-09-08 DIAGNOSIS — Z7951 Long term (current) use of inhaled steroids: Secondary | ICD-10-CM | POA: Diagnosis not present

## 2016-09-08 DIAGNOSIS — Z79899 Other long term (current) drug therapy: Secondary | ICD-10-CM | POA: Diagnosis not present

## 2016-09-08 DIAGNOSIS — G4733 Obstructive sleep apnea (adult) (pediatric): Secondary | ICD-10-CM | POA: Diagnosis not present

## 2016-09-08 DIAGNOSIS — Z9049 Acquired absence of other specified parts of digestive tract: Secondary | ICD-10-CM | POA: Diagnosis not present

## 2016-09-08 DIAGNOSIS — Z9981 Dependence on supplemental oxygen: Secondary | ICD-10-CM | POA: Diagnosis not present

## 2016-09-08 DIAGNOSIS — Z888 Allergy status to other drugs, medicaments and biological substances status: Secondary | ICD-10-CM | POA: Diagnosis not present

## 2016-09-08 DIAGNOSIS — F17210 Nicotine dependence, cigarettes, uncomplicated: Secondary | ICD-10-CM | POA: Diagnosis not present

## 2016-09-27 ENCOUNTER — Ambulatory Visit: Payer: PPO | Admitting: Urology

## 2016-09-27 DIAGNOSIS — I739 Peripheral vascular disease, unspecified: Secondary | ICD-10-CM | POA: Diagnosis not present

## 2016-09-27 DIAGNOSIS — D508 Other iron deficiency anemias: Secondary | ICD-10-CM | POA: Diagnosis not present

## 2016-09-27 DIAGNOSIS — E782 Mixed hyperlipidemia: Secondary | ICD-10-CM | POA: Diagnosis not present

## 2016-09-27 DIAGNOSIS — Z125 Encounter for screening for malignant neoplasm of prostate: Secondary | ICD-10-CM | POA: Diagnosis not present

## 2016-09-27 DIAGNOSIS — I2510 Atherosclerotic heart disease of native coronary artery without angina pectoris: Secondary | ICD-10-CM | POA: Diagnosis not present

## 2016-09-27 NOTE — Progress Notes (Signed)
09/28/2016 3:27 PM   Chris Lozano Aug 15, 1940 [id number]  Referring provider: Adin Hector, MD [place] [facility] [place], [place] [address]  Chief Complaint  Patient presents with  . Benign Prostatic Hypertrophy    old patient of [name] 2016 referred back by [name]    HPI: Patient is a 76 year old Caucasian male who is referred by [name] with LU TS.  He was last seen in our office in 2016 for frequency and ED.  BPH WITH LUTS  (prostate and/or bladder) His IPSS score today is 16, which is moderate lower urinary tract symptomatology. He is terrible with his quality life due to his urinary symptoms. His PVR is 0 mL.    His major complaints today are frequency, urgency, dysuria, nocturia, incontinence, intermittency, hesitancy and straining to urinate.   He has had these symptoms for several years.  He denies any dysuria, hematuria or suprapubic pain.   He also denies any recent fevers, chills, nausea or vomiting.  He does not have a family history of PCa.      IPSS    Row Name 09/28/16 1500         International Prostate Symptom Score   How often have you had the sensation of not emptying your bladder? Almost always     How often have you had to urinate less than every two hours? Not at All     How often have you found you stopped and started again several times when you urinated? Not at All     How often have you found it difficult to postpone urination? More than half the time     How often have you had a weak urinary stream? Almost always     How often have you had to strain to start urination? Less than 1 in 5 times     How many times did you typically get up at night to urinate? 1 Time     Total IPSS Score 16       Quality of Life due to urinary symptoms   If you were to spend the rest of your life with your urinary condition just the way it is now how would you feel about that? Terrible        Score:  1-7  Mild 8-19 Moderate 20-35 Severe  Reviewed referral notes.    PMH: Past Medical History:  Diagnosis Date  . Asthma   . BPH (benign prostatic hyperplasia)   . CAD (coronary artery disease)   . COPD (chronic obstructive pulmonary disease) ([place])   . HLD (hyperlipidemia)   . Sleep apnea     Surgical History: Past Surgical History:  Procedure Laterality Date  . ADENOIDECTOMY    . CARDIAC CATHETERIZATION    . GALLBLADDER SURGERY    . TONSILLECTOMY      Home Medications:  Allergies as of 09/28/2016      Reactions   Ciprocinonide [fluocinolone]       Medication List       Accurate as of 09/28/16  3:27 PM. Always use your most recent med list.          albuterol 108 (90 Base) MCG/ACT inhaler Commonly known as:  PROVENTIL HFA;VENTOLIN HFA Inhale into the lungs.   ALBUTEROL IN Inhale into the lungs.   aspirin 325 MG EC tablet Take 325 mg by mouth daily.   aspirin EC 81 MG tablet  Take 81 mg by mouth daily.   atorvastatin 80 MG tablet Commonly known as:  LIPITOR Take 80 mg by mouth daily.   Besifloxacin HCl 0.6 % Susp   budesonide 180 MCG/ACT inhaler Commonly known as:  PULMICORT Inhale into the lungs 2 (two) times daily.   budesonide-formoterol 160-4.5 MCG/ACT inhaler Commonly known as:  SYMBICORT Inhale 2 puffs into the lungs 2 (two) times daily.   cefUROXime 250 MG tablet Commonly known as:  CEFTIN Take 250 mg by mouth.   DUREZOL 0.05 % Emul Generic drug:  Difluprednate Apply to eye.   fesoterodine 8 MG Tb24 tablet Commonly known as:  TOVIAZ Take 1 tablet (8 mg total) by mouth daily.   hydrochlorothiazide 12.5 MG capsule Commonly known as:  MICROZIDE Take 12.5 mg by mouth daily.   ILEVRO 0.3 % ophthalmic suspension Generic drug:  nepafenac   nicotine polacrilex 2 MG lozenge Commonly known as:  COMMIT 2 mg.   ranitidine 150 MG capsule Commonly known as:  ZANTAC Take 150 mg by mouth 2 (two) times daily.   SPIRIVA HANDIHALER 18 MCG  inhalation capsule Generic drug:  tiotropium PLACE 1 CAPSULE (18 MCG TOTAL) INTO INHALER AND INHALE DAILY   traMADol 50 MG tablet Commonly known as:  ULTRAM Take by mouth.       Allergies:  Allergies  Allergen Reactions  . Ciprocinonide [Fluocinolone]     Family History: Family History  Problem Relation Age of Onset  . Heart disease Unknown   . Kidney disease Neg Hx   . Prostate cancer Neg Hx   . Kidney cancer Neg Hx   . Bladder Cancer Neg Hx     Social History:  reports that he has been smoking.  He has quit using smokeless tobacco. His smokeless tobacco use included Chew. He reports that he drinks alcohol. He reports that he does not use drugs.  ROS: UROLOGY Frequent Urination?: Yes Hard to postpone urination?: Yes Burning/pain with urination?: Yes Get up at night to urinate?: Yes Leakage of urine?: Yes Urine stream starts and stops?: Yes Trouble starting stream?: Yes Do you have to strain to urinate?: Yes Blood in urine?: No Urinary tract infection?: No Sexually transmitted disease?: No Injury to kidneys or bladder?: No Painful intercourse?: No Weak stream?: No Erection problems?: Yes Penile pain?: No  Gastrointestinal Nausea?: No Vomiting?: No Indigestion/heartburn?: No Diarrhea?: No Constipation?: No  Constitutional Fever: No Night sweats?: No Weight loss?: No Fatigue?: No  Skin Skin rash/lesions?: No Itching?: No  Eyes Blurred vision?: No Double vision?: No  Ears/Nose/Throat Sore throat?: No Sinus problems?: No  Hematologic/Lymphatic Swollen glands?: No Easy bruising?: No  Cardiovascular Leg swelling?: No Chest pain?: No  Respiratory Cough?: No Shortness of breath?: Yes  Endocrine Excessive thirst?: No  Musculoskeletal Back pain?: Yes Joint pain?: No  Neurological Headaches?: No Dizziness?: No  Psychologic Depression?: No Anxiety?: No  Physical Exam: BP 125/73   Pulse 71   Ht 5\' 6"  (1.676 m)   Wt 177 lb 11.2  oz (80.6 kg)   BMI 28.68 kg/m   Constitutional: Well nourished. Alert and oriented, No acute distress. HEENT: [place] AT, moist mucus membranes. Trachea midline, no masses. Cardiovascular: No clubbing, cyanosis, or edema. Respiratory: Normal respiratory effort, no increased work of breathing. GI: Abdomen is soft, non tender, non distended, no abdominal masses. Liver and spleen not palpable.  No hernias appreciated.  Stool sample for occult testing is not indicated.   GU: No CVA tenderness.  No bladder fullness or  masses.  Patient with uncircumcised phallus.  Foreskin easily retracted   Urethral meatus is patent.  No penile discharge. No penile lesions or rashes. Scrotum without lesions, cysts, rashes and/or edema.  Testicles are located scrotally bilaterally. No masses are appreciated in the testicles. Left and right epididymis are normal. Rectal: Patient with  normal sphincter tone. Anus and perineum without scarring or rashes. No rectal masses are appreciated. Prostate is approximately 35 grams, no nodules are appreciated. Seminal vesicles are normal. Skin: No rashes, bruises or suspicious lesions. Lymph: No cervical or inguinal adenopathy. Neurologic: Grossly intact, no focal deficits, moving all 4 extremities. Psychiatric: Normal mood and affect.  Laboratory Data: PSA History  1.81 ng/mL in 09/2016  Urinalysis Unremarkable.  See EPIC  I have reviewed the labs.   Pertinent Imaging: Results for Chris, Lozano (MRN [id number]) as of 09/28/2016 15:19  Ref. Range 09/28/2016 15:03  Scan Result Unknown 0   I have independently reviewed the films.    Assessment & Plan:    1. BPH with LUTS  - IPSS score is 16/6  - Continue conservative management, avoiding bladder irritants and timed voiding's  - most bothersome symptoms is/are frequency  - start Myrbetriq 25 mg daily; I have advised the patient of the side effects of Myrbetriq, such as: elevation in BP, urinary retention and/or HA.  -  RTC in 3 months for I PSS and PVR   2. Frequency   - encouraged to stop smoking and reduce caffeine  - see above       Return in about 3 weeks (around 10/19/2016) for IPSS and PVR.  These notes generated with voice recognition software. I apologize for typographical errors.  Zara Council, [facility] [address] [place], [place] [address] [phone]

## 2016-09-28 ENCOUNTER — Ambulatory Visit (INDEPENDENT_AMBULATORY_CARE_PROVIDER_SITE_OTHER): Payer: PPO | Admitting: Urology

## 2016-09-28 ENCOUNTER — Encounter: Payer: Self-pay | Admitting: Urology

## 2016-09-28 VITALS — BP 125/73 | HR 71 | Ht 66.0 in | Wt 177.7 lb

## 2016-09-28 DIAGNOSIS — N401 Enlarged prostate with lower urinary tract symptoms: Secondary | ICD-10-CM

## 2016-09-28 DIAGNOSIS — N138 Other obstructive and reflux uropathy: Secondary | ICD-10-CM

## 2016-09-28 DIAGNOSIS — R350 Frequency of micturition: Secondary | ICD-10-CM

## 2016-09-28 LAB — URINALYSIS, COMPLETE
BILIRUBIN UA: NEGATIVE
Glucose, UA: NEGATIVE
Ketones, UA: NEGATIVE
Nitrite, UA: NEGATIVE
PH UA: 5 (ref 5.0–7.5)
PROTEIN UA: NEGATIVE
RBC, UA: NEGATIVE
Specific Gravity, UA: >1.03 — ABNORMAL HIGH (ref 1.005–1.030)
UUROB: 1 mg/dL (ref 0.2–1.0)

## 2016-09-28 LAB — MICROSCOPIC EXAMINATION
BACTERIA UA: NONE SEEN
EPITHELIAL CELLS (NON RENAL): NONE SEEN /HPF (ref 0–10)
RBC, UA: NONE SEEN /hpf (ref 0–?)

## 2016-09-28 LAB — BLADDER SCAN AMB NON-IMAGING: Scan Result: 0

## 2016-10-05 DIAGNOSIS — I280 Arteriovenous fistula of pulmonary vessels: Secondary | ICD-10-CM | POA: Diagnosis not present

## 2016-10-05 DIAGNOSIS — E782 Mixed hyperlipidemia: Secondary | ICD-10-CM | POA: Diagnosis not present

## 2016-10-05 DIAGNOSIS — R739 Hyperglycemia, unspecified: Secondary | ICD-10-CM | POA: Diagnosis not present

## 2016-10-05 DIAGNOSIS — G4733 Obstructive sleep apnea (adult) (pediatric): Secondary | ICD-10-CM | POA: Diagnosis not present

## 2016-10-05 DIAGNOSIS — I2510 Atherosclerotic heart disease of native coronary artery without angina pectoris: Secondary | ICD-10-CM | POA: Diagnosis not present

## 2016-10-05 DIAGNOSIS — J438 Other emphysema: Secondary | ICD-10-CM | POA: Diagnosis not present

## 2016-10-05 DIAGNOSIS — I739 Peripheral vascular disease, unspecified: Secondary | ICD-10-CM | POA: Diagnosis not present

## 2016-10-05 DIAGNOSIS — F3342 Major depressive disorder, recurrent, in full remission: Secondary | ICD-10-CM | POA: Diagnosis not present

## 2016-10-05 DIAGNOSIS — N400 Enlarged prostate without lower urinary tract symptoms: Secondary | ICD-10-CM | POA: Diagnosis not present

## 2016-10-05 DIAGNOSIS — I700 Atherosclerosis of aorta: Secondary | ICD-10-CM | POA: Diagnosis not present

## 2016-10-05 DIAGNOSIS — J9611 Chronic respiratory failure with hypoxia: Secondary | ICD-10-CM | POA: Diagnosis not present

## 2016-10-05 DIAGNOSIS — D509 Iron deficiency anemia, unspecified: Secondary | ICD-10-CM | POA: Diagnosis not present

## 2016-10-20 NOTE — Progress Notes (Addendum)
10/21/2016 2:22 PM   Chris Lozano August 30, 1940 [id number]  Referring provider: Adin Hector, MD [place] [facility] [place], [place] [address]  Chief Complaint  Patient presents with  . Benign Prostatic Hypertrophy    3 week follow up  . Urinary Frequency    HPI: Patient is a 76 year old Caucasian male with BPH with LU TS and frequency who presents today for a three week follow up after a trial of Myrbetriq 25 mg daily.    BPH WITH LUTS  (prostate and/or bladder) His IPSS score today is 2, which is mild lower urinary tract symptomatology. He is mostly satisfied with his quality life due to his urinary symptoms. His PVR is 0 mL.  His previous I PSS score was 16/6.  His previous PVR is 0 mL.    His major complaints today are frequency, urgency, dysuria, nocturia, incontinence, intermittency, hesitancy and straining to urinate.  He states that all improved with the Myrbetriq.  He has had these symptoms for several years.  He denies any dysuria, hematuria or suprapubic pain.   He also denies any recent fevers, chills, nausea or vomiting.  He does not have a family history of PCa.      IPSS    Row Name 09/28/16 1500 10/21/16 1400       International Prostate Symptom Score   How often have you had the sensation of not emptying your bladder? Almost always Not at All    How often have you had to urinate less than every two hours? Not at All Not at All    How often have you found you stopped and started again several times when you urinated? Not at All Not at All    How often have you found it difficult to postpone urination? More than half the time Not at All    How often have you had a weak urinary stream? Almost always Not at All    How often have you had to strain to start urination? Less than 1 in 5 times Less than 1 in 5 times    How many times did you typically get up at night to urinate? 1 Time 1 Time    Total IPSS Score 16 2      Quality of Life  due to urinary symptoms   If you were to spend the rest of your life with your urinary condition just the way it is now how would you feel about that? Terrible Mostly Satisfied       Score:  1-7 Mild 8-19 Moderate 20-35 Severe   Erectile dysfunction He states he has not had erection in over two years.  He is no longer having spontaneous erections.  He has not tried anything for the ED.    PMH: Past Medical History:  Diagnosis Date  . Asthma   . BPH (benign prostatic hyperplasia)   . CAD (coronary artery disease)   . COPD (chronic obstructive pulmonary disease) ([place])   . HLD (hyperlipidemia)   . Sleep apnea     Surgical History: Past Surgical History:  Procedure Laterality Date  . ADENOIDECTOMY    . CARDIAC CATHETERIZATION    . GALLBLADDER SURGERY    . TONSILLECTOMY      Home Medications:  Allergies as of 10/21/2016      Reactions   Ciprocinonide [fluocinolone]       Medication List       Accurate as of  10/21/16  2:22 PM. Always use your most recent med list.          albuterol 108 (90 Base) MCG/ACT inhaler Commonly known as:  PROVENTIL HFA;VENTOLIN HFA Inhale into the lungs.   ALBUTEROL IN Inhale into the lungs.   aspirin 325 MG EC tablet Take 325 mg by mouth daily.   aspirin EC 81 MG tablet Take 81 mg by mouth daily.   atorvastatin 80 MG tablet Commonly known as:  LIPITOR Take 80 mg by mouth daily.   Besifloxacin HCl 0.6 % Susp   budesonide 180 MCG/ACT inhaler Commonly known as:  PULMICORT Inhale into the lungs 2 (two) times daily.   budesonide-formoterol 160-4.5 MCG/ACT inhaler Commonly known as:  SYMBICORT Inhale 2 puffs into the lungs 2 (two) times daily.   cefUROXime 250 MG tablet Commonly known as:  CEFTIN Take 250 mg by mouth.   DUREZOL 0.05 % Emul Generic drug:  Difluprednate Apply to eye.   fesoterodine 8 MG Tb24 tablet Commonly known as:  TOVIAZ Take 1 tablet (8 mg total) by mouth daily.   hydrochlorothiazide 12.5 MG  capsule Commonly known as:  MICROZIDE Take 12.5 mg by mouth daily.   ILEVRO 0.3 % ophthalmic suspension Generic drug:  nepafenac   mirabegron ER 25 MG Tb24 tablet Commonly known as:  MYRBETRIQ Take 1 tablet (25 mg total) by mouth daily.   nicotine polacrilex 2 MG lozenge Commonly known as:  COMMIT 2 mg.   ranitidine 150 MG capsule Commonly known as:  ZANTAC Take 150 mg by mouth 2 (two) times daily.   SPIRIVA HANDIHALER 18 MCG inhalation capsule Generic drug:  tiotropium PLACE 1 CAPSULE (18 MCG TOTAL) INTO INHALER AND INHALE DAILY   traMADol 50 MG tablet Commonly known as:  ULTRAM Take by mouth.            Discharge Care Instructions        Start     Ordered   10/21/16 0000  BLADDER SCAN AMB NON-IMAGING     10/21/16 1356   10/21/16 0000  mirabegron ER (MYRBETRIQ) 25 MG TB24 tablet  Daily    Question:  Supervising Provider  Answer:  Hollice Espy   10/21/16 1422      Allergies:  Allergies  Allergen Reactions  . Ciprocinonide [Fluocinolone]     Family History: Family History  Problem Relation Age of Onset  . Heart disease Unknown   . Kidney disease Neg Hx   . Prostate cancer Neg Hx   . Kidney cancer Neg Hx   . Bladder Cancer Neg Hx     Social History:  reports that he has been smoking.  He has quit using smokeless tobacco. His smokeless tobacco use included Chew. He reports that he drinks alcohol. He reports that he does not use drugs.  ROS: UROLOGY Frequent Urination?: Yes Hard to postpone urination?: Yes Burning/pain with urination?: No Get up at night to urinate?: No Leakage of urine?: Yes Urine stream starts and stops?: Yes Trouble starting stream?: No Do you have to strain to urinate?: No Blood in urine?: No Urinary tract infection?: No Sexually transmitted disease?: No Injury to kidneys or bladder?: No Painful intercourse?: No Weak stream?: Yes Erection problems?: Yes Penile pain?: No  Gastrointestinal Nausea?: No Vomiting?:  No Indigestion/heartburn?: No Diarrhea?: No Constipation?: No  Constitutional Fever: No Night sweats?: No Weight loss?: No Fatigue?: No  Skin Skin rash/lesions?: No Itching?: No  Eyes Blurred vision?: No Double vision?: No  Ears/Nose/Throat Sore throat?: No  Sinus problems?: No  Hematologic/Lymphatic Swollen glands?: No Easy bruising?: No  Cardiovascular Leg swelling?: No Chest pain?: No  Respiratory Cough?: Yes Shortness of breath?: Yes  Endocrine Excessive thirst?: No  Musculoskeletal Back pain?: Yes Joint pain?: No  Neurological Headaches?: No Dizziness?: No  Psychologic Depression?: No Anxiety?: No  Physical Exam: BP 133/70   Pulse (!) 42   Ht 5\' 6"  (1.676 m)   Wt 179 lb 6.4 oz (81.4 kg)   BMI 28.96 kg/m   Constitutional: Well nourished. Alert and oriented, No acute distress. HEENT: [place] AT, moist mucus membranes. Trachea midline, no masses. Cardiovascular: No clubbing, cyanosis, or edema. Respiratory: Normal respiratory effort, no increased work of breathing. Skin: No rashes, bruises or suspicious lesions. Lymph: No cervical or inguinal adenopathy. Neurologic: Grossly intact, no focal deficits, moving all 4 extremities. Psychiatric: Normal mood and affect.  Laboratory Data: PSA History  1.81 ng/mL in 09/2016  I have reviewed the labs.   Pertinent Imaging: Results for AHMARION, SARACENO (MRN [id number]) as of 10/21/2016 14:19  Ref. Range 10/21/2016 14:10  Scan Result Unknown 0    Assessment & Plan:    1. BPH with LUTS  - IPSS score is 2/2, it is improved  - Continue conservative management, avoiding bladder irritants and timed voiding's  - most bothersome symptoms is/are frequency  - Myrbetriq 25 mg daily effective - script sent to pharmacy   - RTC in 12 months for I PSS and PVR   2. Frequency   - encouraged to stop smoking and reduce caffeine  - see above  3. Erectile dysfunction  - I explained to the patient that in order to  achieve an erection it takes good functioning of the nervous system (parasympathetic and rs, sympathetic, sensory and motor), good blood flow into the erectile tissue of the penis and a desire to have sex  - I explained that conditions like diabetes, hypertension, coronary artery disease, peripheral vascular disease, smoking, alcohol consumption, age, sleep apnea and BPH can diminish the ability to have an erection  - we will obtain a serum testosterone and TSH level at this time; if it is abnormal we will need to repeat the study for confirmation  - A recent study published in Sex Med 2018 Apr 13 revealed moderate to vigorous aerobic exercise for 40 minutes 4 times per week can decrease erectile problems caused by physical inactivity, obesity, hypertension, metabolic syndrome and/or cardiovascular diseases  - We discussed trying a PDE5 inhibitor - Sildenafil 20 mg, 3 to 5 tablets two hours prior to intercourse on an empty stomach, # 44; he is warned not to take medications that contain nitrates.  I also advised him of the side effects, such as: headache, flushing, dyspepsia, abnormal vision, nasal congestion, back pain, myalgia, nausea, dizziness, and rash.  - RTC pending TSH and testosterone results   Return in about 1 year (around 10/21/2017) for IPSS and PVR.  These notes generated with voice recognition software. I apologize for typographical errors.  Zara Council, [facility] [address] [place], [place] [address] [phone]

## 2016-10-21 ENCOUNTER — Encounter: Payer: Self-pay | Admitting: Urology

## 2016-10-21 ENCOUNTER — Ambulatory Visit: Payer: PPO | Admitting: Urology

## 2016-10-21 VITALS — BP 133/70 | HR 42 | Ht 66.0 in | Wt 179.4 lb

## 2016-10-21 DIAGNOSIS — N401 Enlarged prostate with lower urinary tract symptoms: Secondary | ICD-10-CM

## 2016-10-21 DIAGNOSIS — R350 Frequency of micturition: Secondary | ICD-10-CM | POA: Diagnosis not present

## 2016-10-21 DIAGNOSIS — N529 Male erectile dysfunction, unspecified: Secondary | ICD-10-CM

## 2016-10-21 DIAGNOSIS — N138 Other obstructive and reflux uropathy: Secondary | ICD-10-CM

## 2016-10-21 LAB — BLADDER SCAN AMB NON-IMAGING: Scan Result: 0

## 2016-10-21 MED ORDER — MIRABEGRON ER 25 MG PO TB24
25.0000 mg | ORAL_TABLET | Freq: Every day | ORAL | 11 refills | Status: DC
Start: 2016-10-21 — End: 2018-01-04

## 2016-10-21 MED ORDER — SILDENAFIL CITRATE 20 MG PO TABS: ORAL_TABLET | ORAL | 3 refills | Status: DC

## 2016-10-21 NOTE — Progress Notes (Signed)
Called patient to let him know he left office today without going to lab. Future orders placed for patient to come back and get labs. Spoke to wife she will have him return.

## 2016-10-21 NOTE — Addendum Note (Signed)
Addended by: Zara Council A on: 10/21/2016 02:32 PM   Modules accepted: Orders

## 2016-10-21 NOTE — Addendum Note (Signed)
Addended by: Orlene Erm on: 10/21/2016 02:46 PM   Modules accepted: Orders

## 2016-11-11 DIAGNOSIS — H40153 Residual stage of open-angle glaucoma, bilateral: Secondary | ICD-10-CM | POA: Diagnosis not present

## 2016-11-17 DIAGNOSIS — L821 Other seborrheic keratosis: Secondary | ICD-10-CM | POA: Diagnosis not present

## 2016-11-28 ENCOUNTER — Inpatient Hospital Stay
Admission: EM | Admit: 2016-11-28 | Discharge: 2016-12-01 | DRG: 189 | Disposition: A | Discharge status: Home / Self Care | Payer: PPO | Attending: Internal Medicine | Admitting: Internal Medicine

## 2016-11-28 ENCOUNTER — Emergency Department: Payer: PPO

## 2016-11-28 ENCOUNTER — Encounter: Payer: Self-pay | Admitting: Emergency Medicine

## 2016-11-28 DIAGNOSIS — Z79899 Other long term (current) drug therapy: Secondary | ICD-10-CM | POA: Diagnosis not present

## 2016-11-28 DIAGNOSIS — K219 Gastro-esophageal reflux disease without esophagitis: Secondary | ICD-10-CM | POA: Diagnosis present

## 2016-11-28 DIAGNOSIS — E785 Hyperlipidemia, unspecified: Secondary | ICD-10-CM | POA: Diagnosis present

## 2016-11-28 DIAGNOSIS — I2510 Atherosclerotic heart disease of native coronary artery without angina pectoris: Secondary | ICD-10-CM | POA: Diagnosis present

## 2016-11-28 DIAGNOSIS — J9621 Acute and chronic respiratory failure with hypoxia: Principal | ICD-10-CM | POA: Diagnosis present

## 2016-11-28 DIAGNOSIS — J449 Chronic obstructive pulmonary disease, unspecified: Secondary | ICD-10-CM | POA: Diagnosis not present

## 2016-11-28 DIAGNOSIS — R0602 Shortness of breath: Secondary | ICD-10-CM

## 2016-11-28 DIAGNOSIS — Z87891 Personal history of nicotine dependence: Secondary | ICD-10-CM | POA: Diagnosis not present

## 2016-11-28 DIAGNOSIS — J9622 Acute and chronic respiratory failure with hypercapnia: Secondary | ICD-10-CM | POA: Diagnosis present

## 2016-11-28 DIAGNOSIS — J9601 Acute respiratory failure with hypoxia: Secondary | ICD-10-CM | POA: Diagnosis not present

## 2016-11-28 DIAGNOSIS — N400 Enlarged prostate without lower urinary tract symptoms: Secondary | ICD-10-CM | POA: Diagnosis not present

## 2016-11-28 DIAGNOSIS — J9602 Acute respiratory failure with hypercapnia: Secondary | ICD-10-CM | POA: Diagnosis not present

## 2016-11-28 DIAGNOSIS — J441 Chronic obstructive pulmonary disease with (acute) exacerbation: Secondary | ICD-10-CM | POA: Diagnosis not present

## 2016-11-28 DIAGNOSIS — G4733 Obstructive sleep apnea (adult) (pediatric): Secondary | ICD-10-CM | POA: Diagnosis present

## 2016-11-28 DIAGNOSIS — G4730 Sleep apnea, unspecified: Secondary | ICD-10-CM | POA: Diagnosis present

## 2016-11-28 DIAGNOSIS — R0600 Dyspnea, unspecified: Secondary | ICD-10-CM | POA: Diagnosis not present

## 2016-11-28 DIAGNOSIS — I5022 Chronic systolic (congestive) heart failure: Secondary | ICD-10-CM | POA: Diagnosis not present

## 2016-11-28 DIAGNOSIS — Z7982 Long term (current) use of aspirin: Secondary | ICD-10-CM

## 2016-11-28 HISTORY — DX: Gastro-esophageal reflux disease without esophagitis: K21.9

## 2016-11-28 LAB — TROPONIN I: Troponin I: <0.03 ng/mL (ref <0.03)

## 2016-11-28 LAB — CBC WITH DIFFERENTIAL/PLATELET
BASOS PCT: 1 %
Basophils Absolute: 0.1 10*3/uL (ref 0–0.1)
Eosinophils Absolute: 0.2 10*3/uL (ref 0–0.7)
Eosinophils Relative: 2 %
HEMATOCRIT: 41.5 % (ref 40.0–52.0)
Hemoglobin: 13.1 g/dL (ref 13.0–18.0)
LYMPHS PCT: 23 %
Lymphs Abs: 2 10*3/uL (ref 1.0–3.6)
MCH: 27.4 pg (ref 26.0–34.0)
MCHC: 31.5 g/dL — AB (ref 32.0–36.0)
MCV: 86.8 fL (ref 80.0–100.0)
MONO ABS: 0.6 10*3/uL (ref 0.2–1.0)
MONOS PCT: 7 %
NEUTROS ABS: 5.6 10*3/uL (ref 1.4–6.5)
Neutrophils Relative %: 67 %
Platelets: 151 10*3/uL (ref 150–440)
RBC: 4.77 MIL/uL (ref 4.40–5.90)
RDW: 15.9 % — AB (ref 11.5–14.5)
WBC: 8.5 10*3/uL (ref 3.8–10.6)

## 2016-11-28 LAB — BASIC METABOLIC PANEL
Anion gap: 9 (ref 5–15)
BUN: 13 mg/dL (ref 6–20)
CHLORIDE: 94 mmol/L — AB (ref 101–111)
CO2: 35 mmol/L — AB (ref 22–32)
CREATININE: 1.24 mg/dL (ref 0.61–1.24)
Calcium: 8.6 mg/dL — ABNORMAL LOW (ref 8.9–10.3)
GFR calc non Af Amer: 54 mL/min — ABNORMAL LOW (ref >60)
GLUCOSE: 107 mg/dL — AB (ref 65–99)
Potassium: 3.6 mmol/L (ref 3.5–5.1)
Sodium: 138 mmol/L (ref 135–145)

## 2016-11-28 LAB — BRAIN NATRIURETIC PEPTIDE: B Natriuretic Peptide: 67 pg/mL (ref 0.0–100.0)

## 2016-11-28 LAB — LACTIC ACID, PLASMA: Lactic Acid, Venous: 1.6 mmol/L (ref 0.5–1.9)

## 2016-11-28 MED ORDER — IPRATROPIUM-ALBUTEROL 0.5-2.5 (3) MG/3ML IN SOLN
3.0000 mL | Freq: Once | RESPIRATORY_TRACT | Status: AC
Start: 2016-11-28 — End: 2016-11-28
  Administered 2016-11-28: 3 mL via RESPIRATORY_TRACT
  Filled 2016-11-28: qty 3

## 2016-11-28 MED ORDER — IPRATROPIUM-ALBUTEROL 0.5-2.5 (3) MG/3ML IN SOLN
3.0000 mL | Freq: Once | RESPIRATORY_TRACT | Status: AC
  Administered 2016-11-28: 3 mL via RESPIRATORY_TRACT
  Filled 2016-11-28: qty 3

## 2016-11-28 MED ORDER — ALBUTEROL SULFATE (2.5 MG/3ML) 0.083% IN NEBU
INHALATION_SOLUTION | RESPIRATORY_TRACT | Status: AC
  Administered 2016-11-28: 2.5 mg via RESPIRATORY_TRACT
  Filled 2016-11-28: qty 3

## 2016-11-28 MED ORDER — METHYLPREDNISOLONE SODIUM SUCC 125 MG IJ SOLR
125.0000 mg | Freq: Once | INTRAMUSCULAR | Status: AC
  Administered 2016-11-28: 125 mg via INTRAVENOUS
  Filled 2016-11-28: qty 2

## 2016-11-28 MED ORDER — ALBUTEROL SULFATE (2.5 MG/3ML) 0.083% IN NEBU
2.5000 mg | INHALATION_SOLUTION | RESPIRATORY_TRACT | Status: AC
  Administered 2016-11-28: 2.5 mg via RESPIRATORY_TRACT

## 2016-11-28 MED ORDER — DEXTROSE 5 % IV SOLN
500.0000 mg | Freq: Once | INTRAVENOUS | Status: AC
  Administered 2016-11-28: 500 mg via INTRAVENOUS
  Filled 2016-11-28: qty 500

## 2016-11-28 NOTE — H&P (Signed)
[facility] NAME: Chris Lozano    MR#:  [id number]  DATE OF BIRTH:  [birth year]  DATE OF ADMISSION:  11/28/2016  PRIMARY CARE PHYSICIAN: Adin Hector, MD   REQUESTING/REFERRING PHYSICIAN: Jacqualine Code, MD  CHIEF COMPLAINT:   Chief Complaint  Patient presents with  . Shortness of Breath    HISTORY OF PRESENT ILLNESS:  Chris Lozano  is a 76 y.o. male who presents with neck and shortness of breath, and some somnolence. Patient's daughter states that patient has been sleeping more than normal, and he has had episodes of significant somnolence. Patient states that he woke up this afternoon and his wife told him that he had slept too long and at discharge. When he came here to the ED he had significant wheezing, and significant work of effort for breathing. He was placed on BiPAP. Initial workup corresponds with COPD exacerbation. He did also have an elevated CO2 level on his venous blood gas at 93. Hospitalists were called for admission  PAST MEDICAL HISTORY:   Past Medical History:  Diagnosis Date  . Asthma   . BPH (benign prostatic hyperplasia)   . CAD (coronary artery disease)   . COPD (chronic obstructive pulmonary disease) ([place])   . GERD (gastroesophageal reflux disease)   . HLD (hyperlipidemia)   . Sleep apnea     PAST SURGICAL HISTORY:   Past Surgical History:  Procedure Laterality Date  . ADENOIDECTOMY    . CARDIAC CATHETERIZATION    . GALLBLADDER SURGERY    . TONSILLECTOMY      SOCIAL HISTORY:   Social History  Substance Use Topics  . Smoking status: Former Research scientist (life sciences)  . Smokeless tobacco: Former Systems developer    Types: Chew     Comment: as a teenager  . Alcohol use 0.0 oz/week     Comment: occasionally    FAMILY HISTORY:   Family History  Problem Relation Age of Onset  . Heart disease Unknown   . Kidney disease Neg Hx   . Prostate cancer Neg Hx   . Kidney cancer Neg Hx   . Bladder Cancer Neg Hx     DRUG  ALLERGIES:   Allergies  Allergen Reactions  . Ciprocinonide [Fluocinolone]     MEDICATIONS AT HOME:   Prior to Admission medications   Medication Sig Start Date End Date Taking? Authorizing Provider  albuterol (PROVENTIL HFA;VENTOLIN HFA) 108 (90 Base) MCG/ACT inhaler Inhale into the lungs. 12/12/13   [provider]  ALBUTEROL IN Inhale into the lungs.    [provider]  aspirin 325 MG EC tablet Take 325 mg by mouth daily.    [provider]  aspirin EC 81 MG tablet Take 81 mg by mouth daily.    [provider]  atorvastatin (LIPITOR) 80 MG tablet Take 80 mg by mouth daily.    [provider]  Besifloxacin HCl 0.6 % SUSP  09/21/13   [provider]  budesonide (PULMICORT) 180 MCG/ACT inhaler Inhale into the lungs 2 (two) times daily.    [provider]  budesonide-formoterol (SYMBICORT) 160-4.5 MCG/ACT inhaler Inhale 2 puffs into the lungs 2 (two) times daily.    [provider]  cefUROXime (CEFTIN) 250 MG tablet Take 250 mg by mouth. 03/17/16   [provider]  Difluprednate (DUREZOL) 0.05 % EMUL Apply to eye.    [provider]  fesoterodine (TOVIAZ) 8 MG TB24 tablet Take 1 tablet (8 mg total)  by mouth daily. Patient not taking: Reported on 09/28/2016 10/11/14   Zara Council A, PA-C  hydrochlorothiazide (MICROZIDE) 12.5 MG capsule Take 12.5 mg by mouth daily.    [provider]  mirabegron ER (MYRBETRIQ) 25 MG TB24 tablet Take 1 tablet (25 mg total) by mouth daily. 10/21/16   Zara Council A, PA-C  nepafenac (ILEVRO) 0.3 % ophthalmic suspension  09/21/13   [provider]  nicotine polacrilex (COMMIT) 2 MG lozenge 2 mg. 12/12/13   [provider]  ranitidine (ZANTAC) 150 MG capsule Take 150 mg by mouth 2 (two) times daily.    [provider]  sildenafil (REVATIO) 20 MG tablet Take 3 to 5 tablets two hours before intercouse on an empty stomach.  Do not take with  nitrates. 10/21/16   McGowan, Larene Beach A, PA-C  tiotropium (SPIRIVA HANDIHALER) 18 MCG inhalation capsule PLACE 1 CAPSULE (18 MCG TOTAL) INTO INHALER AND INHALE DAILY 12/30/15   [provider]  traMADol (ULTRAM) 50 MG tablet Take by mouth. 03/17/16   [provider]    REVIEW OF SYSTEMS:  Review of Systems  Constitutional: Negative for chills, fever, malaise/fatigue and weight loss.  HENT: Negative for ear pain, hearing loss and tinnitus.   Eyes: Negative for blurred vision, double vision, pain and redness.  Respiratory: Positive for shortness of breath and wheezing. Negative for cough and hemoptysis.   Cardiovascular: Negative for chest pain, palpitations, orthopnea and leg swelling.  Gastrointestinal: Negative for abdominal pain, constipation, diarrhea, nausea and vomiting.  Genitourinary: Negative for dysuria, frequency and hematuria.  Musculoskeletal: Negative for back pain, joint pain and neck pain.  Skin:       No acne, rash, or lesions  Neurological: Negative for dizziness, tremors, focal weakness and weakness.  Endo/Heme/Allergies: Negative for polydipsia. Does not bruise/bleed easily.  Psychiatric/Behavioral: Negative for depression. The patient is not nervous/anxious and does not have insomnia.      VITAL SIGNS:   Vitals:   11/28/16 2117  Weight: 81.6 kg (180 lb)  Height: 5\' 6"  (1.676 m)   Wt Readings from Last 3 Encounters:  11/28/16 81.6 kg (180 lb)  10/21/16 81.4 kg (179 lb 6.4 oz)  09/28/16 80.6 kg (177 lb 11.2 oz)    PHYSICAL EXAMINATION:  Physical Exam  Vitals reviewed. Constitutional: He is oriented to person, place, and time. He appears well-developed and well-nourished. No distress.  HENT:  Head: Normocephalic and atraumatic.  Mouth/Throat: Oropharynx is clear and moist.  Eyes: Pupils are equal, round, and reactive to light. Conjunctivae and EOM are normal. No scleral icterus.  Neck: Normal range of motion. Neck supple. No JVD present. No  thyromegaly present.  Cardiovascular: Normal rate, regular rhythm and intact distal pulses.  Exam reveals no gallop and no friction rub.   No murmur heard. Respiratory: Effort normal. No respiratory distress. He has no wheezes. He has no rales.  Bilateral coarse breath sounds  GI: Soft. Bowel sounds are normal. He exhibits no distension. There is no tenderness.  Musculoskeletal: Normal range of motion. He exhibits no edema.  No arthritis, no gout  Lymphadenopathy:    He has no cervical adenopathy.  Neurological: He is alert and oriented to person, place, and time. No cranial nerve deficit.  No dysarthria, no aphasia  Skin: Skin is warm and dry. No rash noted. No erythema.  Psychiatric: He has a normal mood and affect. His behavior is normal. Judgment and thought content normal.    LABORATORY PANEL:   CBC  Recent  Labs Lab 11/28/16 2148  WBC 8.5  HGB 13.1  HCT 41.5  PLT 151   ------------------------------------------------------------------------------------------------------------------  Chemistries   Recent Labs Lab 11/28/16 2148  NA 138  K 3.6  CL 94*  CO2 35*  GLUCOSE 107*  BUN 13  CREATININE 1.24  CALCIUM 8.6*   ------------------------------------------------------------------------------------------------------------------  Cardiac Enzymes  Recent Labs Lab 11/28/16 2148  TROPONINI <0.03   ------------------------------------------------------------------------------------------------------------------  RADIOLOGY:  Dg Chest Port 1 View  Result Date: 11/28/2016 CLINICAL DATA:  Acute onset of dyspnea. EXAM: PORTABLE CHEST 1 VIEW COMPARISON:  01/16/2012 FINDINGS: Portable AP upright view of the chest. Borderline cardiomegaly. Tortuous thoracic aorta with minimal aortic atherosclerosis. No aneurysm. Mild interstitial edema without significant pleural effusion. No pneumonic consolidation or pneumothorax. IMPRESSION: Borderline cardiomegaly with mild  interstitial edema. Electronically Signed   By: Ashley Royalty M.D.   On: 11/28/2016 22:23    EKG:   Orders placed or performed during the hospital encounter of 11/28/16  . ED EKG  . ED EKG  . EKG 12-Lead  . EKG 12-Lead    IMPRESSION AND PLAN:  Principal Problem:   Acute respiratory failure with hypoxia and hypercapnia (HCC) - patient is significantly more comfortable now on the BiPAP. We will leave him on BiPAP tonight as he does have sleep apnea and uses CPAP at home normally anyway. Treat COPD exacerbation as below Active Problems:   COPD with acute exacerbation (HCC) - IV Solu-Medrol and azithromycin, when necessary duo nebs, continue home inhalers   CAD (coronary artery disease) - continue home meds   Sleep apnea - BiPAP while here. Daughter states patient is on CPAP at home, but is not sure that it is entirely effective. She states she will follow up with primary care physician and patient's pulmonologist to see if he would benefit from BiPAP at home   GERD (gastroesophageal reflux disease) - when necessary H2 blocker at home dose  All the records are reviewed and case discussed with ED provider. Management plans discussed with the patient and/or family.  DVT PROPHYLAXIS: SubQ lovenox  GI PROPHYLAXIS: H2 blocker - PRN home dose  ADMISSION STATUS: Inpatient  CODE STATUS: Full Code Status History    This patient does not have a recorded code status. Please follow your organizational policy for patients in this situation.      TOTAL TIME TAKING CARE OF THIS PATIENT: 45 minutes.   Jannifer Franklin, Brittne Kawasaki [place] 11/28/2016, 10:52 PM  [facility]  Office  [phone]  CC: Primary care physician; Adin Hector, MD  Note:  This document was prepared using Dragon voice recognition software and may include unintentional dictation errors.

## 2016-11-28 NOTE — ED Triage Notes (Signed)
Pt arrives via [facility] where he called out for tremors. Pt is SHOB at this time and 77% on RA. Pt is able to talk in complete sentences at this time. MD Quale at bedside and ordered Bi-Pap.

## 2016-11-28 NOTE — Progress Notes (Signed)
2 sips of water given to patient. Dr Mellissa Kohut. Patient tolerated well. bipap reapplied. Setting adjustments made. Patient tolerated well

## 2016-11-29 ENCOUNTER — Inpatient Hospital Stay: Admit: 2016-11-29 | Payer: PPO

## 2016-11-29 DIAGNOSIS — J441 Chronic obstructive pulmonary disease with (acute) exacerbation: Secondary | ICD-10-CM

## 2016-11-29 DIAGNOSIS — J9602 Acute respiratory failure with hypercapnia: Secondary | ICD-10-CM

## 2016-11-29 DIAGNOSIS — J9601 Acute respiratory failure with hypoxia: Secondary | ICD-10-CM

## 2016-11-29 LAB — BASIC METABOLIC PANEL
Anion gap: 8 (ref 5–15)
BUN: 12 mg/dL (ref 6–20)
CALCIUM: 8.3 mg/dL — AB (ref 8.9–10.3)
CO2: 34 mmol/L — AB (ref 22–32)
CREATININE: 1.12 mg/dL (ref 0.61–1.24)
Chloride: 96 mmol/L — ABNORMAL LOW (ref 101–111)
GFR calc non Af Amer: >60 mL/min (ref >60)
Glucose, Bld: 168 mg/dL — ABNORMAL HIGH (ref 65–99)
Potassium: 3.8 mmol/L (ref 3.5–5.1)
SODIUM: 138 mmol/L (ref 135–145)

## 2016-11-29 LAB — CBC
HCT: 38.8 % — ABNORMAL LOW (ref 40.0–52.0)
Hemoglobin: 12.3 g/dL — ABNORMAL LOW (ref 13.0–18.0)
MCH: 27.3 pg (ref 26.0–34.0)
MCHC: 31.6 g/dL — ABNORMAL LOW (ref 32.0–36.0)
MCV: 86.5 fL (ref 80.0–100.0)
PLATELETS: 143 10*3/uL — AB (ref 150–440)
RBC: 4.49 MIL/uL (ref 4.40–5.90)
RDW: 15.6 % — AB (ref 11.5–14.5)
WBC: 7.5 10*3/uL (ref 3.8–10.6)

## 2016-11-29 LAB — MRSA PCR SCREENING: MRSA BY PCR: NEGATIVE

## 2016-11-29 LAB — GLUCOSE, CAPILLARY: Glucose-Capillary: 181 mg/dL — ABNORMAL HIGH (ref 65–99)

## 2016-11-29 MED ORDER — ENOXAPARIN SODIUM 40 MG/0.4ML ~~LOC~~ SOLN
40.0000 mg | SUBCUTANEOUS | Status: DC
  Administered 2016-11-29 – 2016-11-30 (×2): 40 mg via SUBCUTANEOUS
  Filled 2016-11-29 (×2): qty 0.4

## 2016-11-29 MED ORDER — METHYLPREDNISOLONE SODIUM SUCC 125 MG IJ SOLR: 60.0000 mg | Freq: Two times a day (BID) | INTRAMUSCULAR | Status: DC

## 2016-11-29 MED ORDER — ONDANSETRON HCL 4 MG/2ML IJ SOLN: 4.0000 mg | Freq: Four times a day (QID) | INTRAMUSCULAR | Status: DC | PRN

## 2016-11-29 MED ORDER — IPRATROPIUM-ALBUTEROL 0.5-2.5 (3) MG/3ML IN SOLN: 3.0000 mL | RESPIRATORY_TRACT | Status: DC | PRN

## 2016-11-29 MED ORDER — POTASSIUM CHLORIDE CRYS ER 20 MEQ PO TBCR
20.0000 meq | EXTENDED_RELEASE_TABLET | Freq: Once | ORAL | Status: AC
  Administered 2016-11-29: 20 meq via ORAL
  Filled 2016-11-29: qty 1

## 2016-11-29 MED ORDER — ACETAMINOPHEN 650 MG RE SUPP: 650.0000 mg | Freq: Four times a day (QID) | RECTAL | Status: DC | PRN

## 2016-11-29 MED ORDER — METHYLPREDNISOLONE SODIUM SUCC 40 MG IJ SOLR
40.0000 mg | Freq: Two times a day (BID) | INTRAMUSCULAR | Status: DC
  Administered 2016-11-29: 40 mg via INTRAVENOUS
  Filled 2016-11-29 (×2): qty 1

## 2016-11-29 MED ORDER — ADULT MULTIVITAMIN W/MINERALS CH
1.0000 | ORAL_TABLET | Freq: Every day | ORAL | Status: DC
  Administered 2016-11-29 – 2016-12-01 (×3): 1 via ORAL
  Filled 2016-11-29 (×3): qty 1

## 2016-11-29 MED ORDER — AZITHROMYCIN 500 MG PO TABS: 500.0000 mg | ORAL_TABLET | Freq: Every day | ORAL | Status: DC

## 2016-11-29 MED ORDER — ASPIRIN EC 325 MG PO TBEC
325.0000 mg | DELAYED_RELEASE_TABLET | Freq: Every day | ORAL | Status: DC
  Administered 2016-11-29 – 2016-12-01 (×3): 325 mg via ORAL
  Filled 2016-11-29 (×3): qty 1

## 2016-11-29 MED ORDER — FOLIC ACID 1 MG PO TABS
1.0000 mg | ORAL_TABLET | Freq: Every day | ORAL | Status: DC
  Administered 2016-11-29 – 2016-12-01 (×3): 1 mg via ORAL
  Filled 2016-11-29 (×3): qty 1

## 2016-11-29 MED ORDER — TIOTROPIUM BROMIDE MONOHYDRATE 18 MCG IN CAPS
18.0000 ug | ORAL_CAPSULE | Freq: Every day | RESPIRATORY_TRACT | Status: DC
  Administered 2016-11-29 – 2016-12-01 (×3): 18 ug via RESPIRATORY_TRACT
  Filled 2016-11-29: qty 5

## 2016-11-29 MED ORDER — FUROSEMIDE 10 MG/ML IJ SOLN
40.0000 mg | Freq: Once | INTRAMUSCULAR | Status: AC
  Administered 2016-11-29: 40 mg via INTRAVENOUS
  Filled 2016-11-29: qty 4

## 2016-11-29 MED ORDER — FAMOTIDINE 20 MG PO TABS: 20.0000 mg | ORAL_TABLET | Freq: Every day | ORAL | Status: DC | PRN

## 2016-11-29 MED ORDER — METHYLPREDNISOLONE SODIUM SUCC 125 MG IJ SOLR
60.0000 mg | Freq: Four times a day (QID) | INTRAMUSCULAR | Status: DC
  Administered 2016-11-29: 60 mg via INTRAVENOUS
  Filled 2016-11-29: qty 2

## 2016-11-29 MED ORDER — ALBUTEROL SULFATE (2.5 MG/3ML) 0.083% IN NEBU: 2.5000 mg | INHALATION_SOLUTION | RESPIRATORY_TRACT | Status: DC | PRN

## 2016-11-29 MED ORDER — VITAMIN B-1 100 MG PO TABS
100.0000 mg | ORAL_TABLET | Freq: Every day | ORAL | Status: DC
  Administered 2016-11-29 – 2016-12-01 (×3): 100 mg via ORAL
  Filled 2016-11-29 (×3): qty 1

## 2016-11-29 MED ORDER — ATORVASTATIN CALCIUM 20 MG PO TABS
80.0000 mg | ORAL_TABLET | Freq: Every day | ORAL | Status: DC
  Administered 2016-11-29 – 2016-12-01 (×3): 80 mg via ORAL
  Filled 2016-11-29 (×3): qty 4

## 2016-11-29 MED ORDER — MOMETASONE FURO-FORMOTEROL FUM 200-5 MCG/ACT IN AERO
2.0000 | INHALATION_SPRAY | Freq: Two times a day (BID) | RESPIRATORY_TRACT | Status: DC
Start: 2016-11-29 — End: 2016-12-01
  Administered 2016-11-29 – 2016-12-01 (×5): 2 via RESPIRATORY_TRACT
  Filled 2016-11-29: qty 8.8

## 2016-11-29 MED ORDER — ONDANSETRON HCL 4 MG PO TABS: 4.0000 mg | ORAL_TABLET | Freq: Four times a day (QID) | ORAL | Status: DC | PRN

## 2016-11-29 MED ORDER — ACETAMINOPHEN 325 MG PO TABS: 650.0000 mg | ORAL_TABLET | Freq: Four times a day (QID) | ORAL | Status: DC | PRN

## 2016-11-29 NOTE — Consult Note (Signed)
PULMONARY / CRITICAL CARE MEDICINE   Name: Chris Lozano MRN: [id number] DOB: Dec 09, 1940    ADMISSION DATE:  11/28/2016   CONSULTATION DATE:  11/28/2016  REFERRING MD: Dr. Jannifer Franklin  Reason: Acute hypoxic and hypercarbic respiratory failure requiring BiPAP  CHIEF COMPLAINT: Acute respiratory distress  HISTORY OF PRESENT ILLNESS:   This is a 76 year old Caucasian male with a past medical history as indicated below who presented to the ED with acute shortness of breath.  History is obtained from patient's chart and from ED records as patient is currently somnolent.  Per ED records, EMS was called because patient was severely short of breath.  When EMS arrived his SPO2 on room air was 77% and he could not talk in complete sentences.  He was placed on transferred to the ED.  At the ED he was placed on continuous BiPAP.  His blood gas showed severe hypercarbia and hypoxemia.  He was admitted to the ICU for further management of acute hypercarbic/hypoxic respiratory failure.  Patient has a history of COPD and takes Spiriva, symbicort and albuterol at home.  He has a CPAP machine but it is unclear if he uses it as recommended  PAST MEDICAL HISTORY :  He  has a past medical history of Asthma; BPH (benign prostatic hyperplasia); CAD (coronary artery disease); COPD (chronic obstructive pulmonary disease) ([place]); GERD (gastroesophageal reflux disease); HLD (hyperlipidemia); and Sleep apnea.  PAST SURGICAL HISTORY: He  has a past surgical history that includes Gallbladder surgery; Tonsillectomy; Adenoidectomy; and Cardiac catheterization.  Allergies  Allergen Reactions  . Ciprocinonide [Fluocinolone] Other (See Comments)    Reaction: dizzines, "drunk"    No current facility-administered medications on file prior to encounter.    Current Outpatient Prescriptions on File Prior to Encounter  Medication Sig  . albuterol (PROVENTIL HFA;VENTOLIN HFA) 108 (90 Base) MCG/ACT inhaler Inhale 2 puffs into the  lungs every 6 (six) hours as needed for wheezing or shortness of breath.   Marland Kitchen aspirin EC 81 MG tablet Take 81 mg by mouth daily.  Marland Kitchen atorvastatin (LIPITOR) 80 MG tablet Take 80 mg by mouth daily.  . budesonide-formoterol (SYMBICORT) 160-4.5 MCG/ACT inhaler Inhale 2 puffs into the lungs 2 (two) times daily.  . hydrochlorothiazide (MICROZIDE) 12.5 MG capsule Take 12.5 mg by mouth daily.  . mirabegron ER (MYRBETRIQ) 25 MG TB24 tablet Take 1 tablet (25 mg total) by mouth daily.  . ranitidine (ZANTAC) 150 MG capsule Take 150 mg by mouth 2 (two) times daily as needed for heartburn.   . sildenafil (REVATIO) 20 MG tablet Take 3 to 5 tablets two hours before intercouse on an empty stomach.  Do not take with nitrates.  Marland Kitchen tiotropium (SPIRIVA HANDIHALER) 18 MCG inhalation capsule PLACE 1 CAPSULE (18 MCG TOTAL) INTO INHALER AND INHALE DAILY    FAMILY HISTORY:  His indicated that the status of his neg hx is unknown. He indicated that the status of his unknown relative is unknown.    SOCIAL HISTORY: He  reports that he has quit smoking. He has quit using smokeless tobacco. His smokeless tobacco use included Chew. He reports that he drinks alcohol. He reports that he does not use drugs.  REVIEW OF SYSTEMS:   Unable to obtain as patient is on continuous BiPAP and currently somnolent  SUBJECTIVE:   VITAL SIGNS: BP (!) 98/57   Pulse 68   Temp (!) 97.4 F (36.3 C) (Axillary)   Resp (!) 29   Ht 5\' 6"  (1.676 m)   Wt 173  lb 4.5 oz (78.6 kg)   SpO2 98%   BMI 27.97 kg/m   HEMODYNAMICS:    VENTILATOR SETTINGS:    INTAKE / OUTPUT: No intake/output data recorded.  PHYSICAL EXAMINATION: General: Sleeping comfortably, no acute distress Neuro: Somnolent, opens eyes to voice and touch HEENT: PERRLA, no JVD Cardiovascular:  RRR, S1-S2, no murmur regurg Lungs: Bilateral breath sounds, mild expiratory wheezes, diminished in the bases Abdomen: Positive bowel sounds in all 4 quadrants nondistended  palpation reveals no organomegaly Musculoskeletal: Positive range of motion no deformities Skin: Warm and dry without cyanosis  LABS:  BMET  Recent Labs Lab 11/28/16 2148 11/29/16 0416  NA 138 138  K 3.6 3.8  CL 94* 96*  CO2 35* 34*  BUN 13 12  CREATININE 1.24 1.12  GLUCOSE 107* 168*    Electrolytes  Recent Labs Lab 11/28/16 2148 11/29/16 0416  CALCIUM 8.6* 8.3*    CBC  Recent Labs Lab 11/28/16 2148 11/29/16 0416  WBC 8.5 7.5  HGB 13.1 12.3*  HCT 41.5 38.8*  PLT 151 143*    Coag's No results for input(s): APTT, INR in the last 168 hours.  Sepsis Markers  Recent Labs Lab 11/28/16 2148  LATICACIDVEN 1.6    ABG No results for input(s): PHART, PCO2ART, PO2ART in the last 168 hours.  Liver Enzymes No results for input(s): AST, ALT, ALKPHOS, BILITOT, ALBUMIN in the last 168 hours.  Cardiac Enzymes  Recent Labs Lab 11/28/16 2148  TROPONINI <0.03    Glucose  Recent Labs Lab 11/29/16 0020  GLUCAP 181*    Imaging Dg Chest Port 1 View  Result Date: 11/28/2016 CLINICAL DATA:  Acute onset of dyspnea. EXAM: PORTABLE CHEST 1 VIEW COMPARISON:  01/16/2012 FINDINGS: Portable AP upright view of the chest. Borderline cardiomegaly. Tortuous thoracic aorta with minimal aortic atherosclerosis. No aneurysm. Mild interstitial edema without significant pleural effusion. No pneumonic consolidation or pneumothorax. IMPRESSION: Borderline cardiomegaly with mild interstitial edema. Electronically Signed   By: Ashley Royalty M.D.   On: 11/28/2016 22:23     STUDIES:  2D echo pending  CULTURES: None  ANTIBIOTICS: Azithromycin 10/21>  SIGNIFICANT EVENTS: 10/21:   LINES/TUBES: PIVs  DISCUSSION: Some 44-year-old Caucasian male presenting with acute on chronic hypoxic/hypercarbic respiratory failure  ASSESSMENT  Acute hypoxic and hypercarbic respiratory failure Acute COPD exacerbation Mild  pulmonary edema on chest x-ray-?CHF OSA History of  hyperlipidemia, coronary artery disease and sleep apnea  Plan Hemodynamic monitoring per ICU protocol Continuous BiPAP and titrate to nasal cannula as tolerated  Lasix 40 mg IV x1 2D echo to evaluate left ventricular ejection fraction Portable chest x-ray and ABG as needed Inhaled bronchodilators and IV steroids Antibiotics for prophylaxis as above Resume all home medications GI and DVT prophylaxis Keep n.p.o. while on BiPAP except for sips with meds  FAMILY  - Updates: Patient somnolent.  No family at bedside will update when available.  - Inter-disciplinary family meet or Palliative Care meeting due by:  day 7  Chris Lozano S. [facility] ANP-BC Pulmonary and Critical Care Medicine [facility] Pager [phone] or [phone]  11/29/2016, 6:54 AM

## 2016-11-29 NOTE — Progress Notes (Signed)
eLink Physician-Brief Progress Note Patient Name: Chris Lozano DOB: 08/01/1940 MRN: [id number]   Date of Service  11/29/2016  HPI/Events of Note  76 year old with hypercarbic respiratory failure, COPD exacerbation On bronchodilators, steroids, antibiotics Stable on BiPAP  eICU Interventions  No eICU interventions     Intervention Category Evaluation Type: New Patient Evaluation  Christiana Gurevich 11/29/2016, 12:34 AM

## 2016-11-29 NOTE — Progress Notes (Signed)
Patient has been resting most of the night on bipap. Has sleep apnea and uses a cpap at home. Has been alert and at his baseline here in the hospital. Daughter was present at admission, and no other concerns other than his respiratory issues. Denies pain and discomfort.

## 2016-11-29 NOTE — ED Provider Notes (Signed)
[facility] Emergency Department Provider Note   ____________________________________________   First MD Initiated Contact with Patient 11/28/16 2124     (approximate)  I have reviewed the triage vital signs and the nursing notes.   HISTORY  Chief Complaint Shortness of Breath  EM caveat: Limited due to hypoxia and shortness of breath  HPI Chris Lozano is a 76 y.o. male called 911 due to shortness of breath that started this evening.  Patient reports she felt fine earlier, just after watching a football game he started having shortness of breath this evening.  Relatively rapid in onset, reports his wheezing and feeling weak in general and very short of breath.  He uses oxygen at home.  Even on his home oxygen he was feeling very short of breath.  No chest pain.  No nausea or vomiting.  Also reports he started feeling very tremulous and getting the shakes with chills  Past Medical History:  Diagnosis Date  . Asthma   . BPH (benign prostatic hyperplasia)   . CAD (coronary artery disease)   . COPD (chronic obstructive pulmonary disease) ([place])   . GERD (gastroesophageal reflux disease)   . HLD (hyperlipidemia)   . Sleep apnea     Patient Active Problem List   Diagnosis Date Noted  . Acute respiratory failure with hypoxia and hypercapnia ([place]) 11/28/2016  . COPD with acute exacerbation ([place]) 11/28/2016  . CAD (coronary artery disease) 11/28/2016  . HLD (hyperlipidemia) 11/28/2016  . Sleep apnea 11/28/2016  . GERD (gastroesophageal reflux disease) 11/28/2016  . Acute on chronic respiratory failure with hypoxia and hypercapnia ([place]) 11/28/2016    Past Surgical History:  Procedure Laterality Date  . ADENOIDECTOMY    . CARDIAC CATHETERIZATION    . GALLBLADDER SURGERY    . TONSILLECTOMY      Prior to Admission medications   Medication Sig Start Date End Date Taking? Authorizing Provider  albuterol (PROVENTIL HFA;VENTOLIN HFA) 108 (90 Base)  MCG/ACT inhaler Inhale 2 puffs into the lungs every 6 (six) hours as needed for wheezing or shortness of breath.  12/12/13  Yes [provider]  aspirin EC 81 MG tablet Take 81 mg by mouth daily.   Yes [provider]  atorvastatin (LIPITOR) 80 MG tablet Take 80 mg by mouth daily.   Yes [provider]  budesonide-formoterol (SYMBICORT) 160-4.5 MCG/ACT inhaler Inhale 2 puffs into the lungs 2 (two) times daily.   Yes [provider]  hydrochlorothiazide (MICROZIDE) 12.5 MG capsule Take 12.5 mg by mouth daily.   Yes [provider]  mirabegron ER (MYRBETRIQ) 25 MG TB24 tablet Take 1 tablet (25 mg total) by mouth daily. 10/21/16  Yes McGowan, Larene Beach A, PA-C  ranitidine (ZANTAC) 150 MG capsule Take 150 mg by mouth 2 (two) times daily as needed for heartburn.    Yes [provider]  sildenafil (REVATIO) 20 MG tablet Take 3 to 5 tablets two hours before intercouse on an empty stomach.  Do not take with nitrates. 10/21/16  Yes McGowan, Larene Beach A, PA-C  tiotropium (SPIRIVA HANDIHALER) 18 MCG inhalation capsule PLACE 1 CAPSULE (18 MCG TOTAL) INTO INHALER AND INHALE DAILY 12/30/15  Yes [provider]    Allergies Ciprocinonide [fluocinolone]  Family History  Problem Relation Age of Onset  . Heart disease Unknown   . Kidney disease Neg Hx   . Prostate cancer Neg Hx   . Kidney cancer Neg Hx   . Bladder Cancer Neg Hx  Social History Social History  Substance Use Topics  . Smoking status: Former Research scientist (life sciences)  . Smokeless tobacco: Former Systems developer    Types: Chew     Comment: as a teenager  . Alcohol use 0.0 oz/week     Comment: occasionally    Review of Systems Cardiovascular: Denies chest pain. Respiratory: See HPI  gastrointestinal: No abdominal pain.   Musculoskeletal: Negative for back pain. Skin: Negative for rash.  Denies swelling Neurological: Negative for headaches, focal weakness or  numbness.    ____________________________________________   PHYSICAL EXAM:  VITAL SIGNS: ED Triage Vitals  Enc Vitals Group     BP 11/28/16 2300 120/80     Pulse Rate 11/28/16 2300 94     Resp --      Temp 11/29/16 0000 100.3 F (37.9 C)     Temp Source 11/29/16 0000 Axillary     SpO2 11/28/16 2300 96 %     Weight 11/28/16 2117 180 lb (81.6 kg)     Height 11/28/16 2117 5\' 6"  (1.676 m)     Head Circumference --      Peak Flow --      Pain Score 11/28/16 2115 0     Pain Loc --      Pain Edu? --      Excl. in [place]? --     Constitutional: Alert and oriented.  Appears to have significant increased work of breathing, sitting up in a slight tripoding position.  His oxygen saturation is noted to be in the low 80s on 2 L nasal cannula Eyes: Conjunctivae are normal. Head: Atraumatic. Nose: No congestion/rhinnorhea. Mouth/Throat: Mucous membranes are moist. Neck: No stridor.   Cardiovascular: Normal rate, regular rhythm. Grossly normal heart sounds.  Good peripheral circulation. Respiratory: Moderate to severe increased work of breathing.  No use of accessory muscles unable to speak more than 1 word at a time.  Tripoding with an  wheezing heard at throughout all lobes Gastrointestinal: Soft and nontender. No distention. Musculoskeletal: No lower extremity tenderness nor edema. Neurologic:  Normal speech and language. No gross focal neurologic deficits are appreciated.  Skin:  Skin is warm, dry and intact. No rash noted. Psychiatric: Mood and affect are slightly anxious  ____________________________________________   LABS (all labs ordered are listed, but only abnormal results are displayed)  Labs Reviewed  BASIC METABOLIC PANEL - Abnormal; Notable for the following:       Result Value   Chloride 94 (*)    CO2 35 (*)    Glucose, Bld 107 (*)    Calcium 8.6 (*)    GFR calc non Af Amer 54 (*)    All other components within normal limits  BLOOD GAS, VENOUS - Abnormal; Notable  for the following:    pCO2, Ven 93 (*)    pO2, Ven <31.0 (*)    Bicarbonate 44.7 (*)    Acid-Base Excess 13.6 (*)    All other components within normal limits  CBC WITH DIFFERENTIAL/PLATELET - Abnormal; Notable for the following:    MCHC 31.5 (*)    RDW 15.9 (*)    All other components within normal limits  GLUCOSE, CAPILLARY - Abnormal; Notable for the following:    Glucose-Capillary 181 (*)    All other components within normal limits  MRSA PCR SCREENING  TROPONIN I  LACTIC ACID, PLASMA  BRAIN NATRIURETIC PEPTIDE  BASIC METABOLIC PANEL  CBC   ____________________________________________  EKG  Reviewed and interpreted by me at 2122 Heart rate 80  QRS 120 QTC 410 Unclear underlying rhythm, possibly A. fib.  There is significant tremor and artifact due to the patient's shakes or rigors. No VF/VT.  ____________________________________________  RADIOLOGY Dg Chest Port 1 View  Result Date: 11/28/2016 CLINICAL DATA:  Acute onset of dyspnea. EXAM: PORTABLE CHEST 1 VIEW COMPARISON:  01/16/2012 FINDINGS: Portable AP upright view of the chest. Borderline cardiomegaly. Tortuous thoracic aorta with minimal aortic atherosclerosis. No aneurysm. Mild interstitial edema without significant pleural effusion. No pneumonic consolidation or pneumothorax. IMPRESSION: Borderline cardiomegaly with mild interstitial edema. Electronically Signed   By: Ashley Royalty M.D.   On: 11/28/2016 22:23    Chest x-ray reviewed, noted for mild interstitial edema ____________________________________________   PROCEDURES  Procedure(s) performed: None  Procedures  Critical Care performed: Yes, see critical care note(s)  CRITICAL CARE Performed by: Delman Kitten   Total critical care time: 40 minutes  Critical care time was exclusive of separately billable procedures and treating other patients.  Critical care was necessary to treat or prevent imminent or life-threatening deterioration.  Critical care  was time spent personally by me on the following activities: development of treatment plan with patient and/or surrogate as well as nursing, discussions with consultants, evaluation of patient's response to treatment, examination of patient, obtaining history from patient or surrogate, ordering and performing treatments and interventions, ordering and review of laboratory studies, ordering and review of radiographic studies, pulse oximetry and re-evaluation of patient's condition.  ____________________________________________   INITIAL IMPRESSION / ASSESSMENT AND PLAN / ED COURSE  Pertinent labs & imaging results that were available during my care of the patient were reviewed by me and considered in my medical decision making (see chart for details).  Patient presents for evaluation of dyspnea.  He is found to be quite hypoxic by EMS.  He was given nebulizers prior to arrival, currently has ongoing increased work of breathing and is somewhat in a tripod type position.  I have placed the patient on BiPAP with good improvement and good volume thereafter.  He reports his shortness of breath improving.  His exam and history seem to suggest likely COPD exacerbation, also consider possible CHF versus volume overload and setting of the patient's chest x-ray finding.  Patient also noted to have a low-grade temperature, suspect likely bronchitis with exacerbation of his COPD.  Cover empirically with azithromycin.  Patient will be admitted to the hospital on BiPAP.  He is showing improvement, resting much more comfortably at the time of admission on BiPAP.  Increased work of breathing and oxygenation.  Now satting in the mid 71s.      ____________________________________________   FINAL CLINICAL IMPRESSION(S) / ED DIAGNOSES  Final diagnoses:  Acute and chronic respiratory failure with hypoxia ([place])      NEW MEDICATIONS STARTED DURING THIS VISIT:  Current Discharge Medication List       Note:   This document was prepared using Dragon voice recognition software and may include unintentional dictation errors.     Delman Kitten, MD 11/29/16 [phone]

## 2016-11-29 NOTE — Progress Notes (Signed)
[place] at [place] NAME: Chris Lozano    MR#:  [id number]  DATE OF BIRTH:  1940/02/18  SUBJECTIVE:  CHIEF COMPLAINT:  Patient shortness of breath is better off BiPAP. Wife and daughter at bedside Patient uses CPAP at bedtime at home  REVIEW OF SYSTEMS:  CONSTITUTIONAL: No fever, fatigue or weakness.  EYES: No blurred or double vision.  EARS, NOSE, AND THROAT: No tinnitus or ear pain.  RESPIRATORY: Improving cough, shortness of breath, denies wheezing or hemoptysis.  CARDIOVASCULAR: No chest pain, orthopnea, edema.  GASTROINTESTINAL: No nausea, vomiting, diarrhea or abdominal pain.  GENITOURINARY: No dysuria, hematuria.  ENDOCRINE: No polyuria, nocturia,  HEMATOLOGY: No anemia, easy bruising or bleeding SKIN: No rash or lesion. MUSCULOSKELETAL: No joint pain or arthritis.   NEUROLOGIC: No tingling, numbness, weakness.  PSYCHIATRY: No anxiety or depression.   DRUG ALLERGIES:   Allergies  Allergen Reactions  . Ciprocinonide [Fluocinolone] Other (See Comments)    Reaction: dizzines, "drunk"    VITALS:  Blood pressure (!) 117/100, pulse 76, temperature 98.2 F (36.8 C), resp. rate 18, height 5\' 6"  (1.676 m), weight 78.6 kg (173 lb 4.5 oz), SpO2 90 %.  PHYSICAL EXAMINATION:  GENERAL:  76 y.o.-year-old patient lying in the bed with no acute distress.  EYES: Pupils equal, round, reactive to light and accommodation. No scleral icterus. Extraocular muscles intact.  HEENT: Head atraumatic, normocephalic. Oropharynx and nasopharynx clear.  NECK:  Supple, no jugular venous distention. No thyroid enlargement, no tenderness.  LUNGS:  moderately diminished breath sounds bilaterally, no wheezing, rales,rhonchi or crepitation. No use of accessory muscles of respiration.  CARDIOVASCULAR: S1, S2 normal. No murmurs, rubs, or gallops.  ABDOMEN: Soft, nontender, nondistended. Bowel sounds present. No organomegaly or mass.  EXTREMITIES: No  pedal edema, cyanosis, or clubbing.  NEUROLOGIC: Cranial nerves II through XII are intact. Muscle strength 5/5 in all extremities. Sensation intact. Gait not checked.  PSYCHIATRIC: The patient is alert and oriented x 3.  SKIN: No obvious rash, lesion, or ulcer.    LABORATORY PANEL:   CBC  Recent Labs Lab 11/29/16 0416  WBC 7.5  HGB 12.3*  HCT 38.8*  PLT 143*   ------------------------------------------------------------------------------------------------------------------  Chemistries   Recent Labs Lab 11/29/16 0416  NA 138  K 3.8  CL 96*  CO2 34*  GLUCOSE 168*  BUN 12  CREATININE 1.12  CALCIUM 8.3*   ------------------------------------------------------------------------------------------------------------------  Cardiac Enzymes  Recent Labs Lab 11/28/16 2148  TROPONINI <0.03   ------------------------------------------------------------------------------------------------------------------  RADIOLOGY:  Dg Chest Port 1 View  Result Date: 11/28/2016 CLINICAL DATA:  Acute onset of dyspnea. EXAM: PORTABLE CHEST 1 VIEW COMPARISON:  01/16/2012 FINDINGS: Portable AP upright view of the chest. Borderline cardiomegaly. Tortuous thoracic aorta with minimal aortic atherosclerosis. No aneurysm. Mild interstitial edema without significant pleural effusion. No pneumonic consolidation or pneumothorax. IMPRESSION: Borderline cardiomegaly with mild interstitial edema. Electronically Signed   By: Ashley Royalty M.D.   On: 11/28/2016 22:23    EKG:   Orders placed or performed during the hospital encounter of 11/28/16  . ED EKG  . ED EKG  . EKG 12-Lead  . EKG 12-Lead    ASSESSMENT AND PLAN:     #Acute respiratory failure with hypoxia and hypercapnia (HCC) - secondary to COPD exacerbation  patient is significantly more comfortable now off the BiPAP.  Continue to place the treatments and IV steroids  IV azithromycin  BiPAP as needed and follow up with pulmonology  COPD with acute exacerbation (HCC) - IV Solu-Medrol and azithromycin, when necessary duo nebs, continue home inhalers    CAD (coronary artery disease) - continue home meds    Sleep apnea - BiPAP while here. Daughter states patient is on CPAP at home, but is not sure that it is entirely effective. She states she will follow up with patient's pulmonologist to see if he would benefit from BiPAP at home     GERD (gastroesophageal reflux disease) - H2 blocker    All the records are reviewed and case discussed with Care Management/Social Workerr. Management plans discussed with the patient, family and they are in agreement.  CODE STATUS: fc   TOTAL TIME TAKING CARE OF THIS PATIENT: 35  minutes.   POSSIBLE D/C IN 2 DAYS, DEPENDING ON CLINICAL CONDITION.  Note: This dictation was prepared with Dragon dictation along with smaller phrase technology. Any transcriptional errors that result from this process are unintentional.   Nicholes Mango M.D on 11/29/2016 at 4:24 PM  Between 7am to 6pm - Pager - [phone] After 6pm go to [email or web address] - password [email or web address] [facility]  [place] [facility]  Office  [phone]  CC: Primary care physician; Adin Hector, MD

## 2016-11-29 NOTE — Progress Notes (Signed)
Great day. Weaned off of BiPAP to nasal cannula easily. Resumed 2 liters per nasal cannula-home oxygen setting. Very alert and ate well. Awaiting a floor bed.

## 2016-11-30 ENCOUNTER — Telehealth: Payer: Self-pay | Admitting: *Deleted

## 2016-11-30 ENCOUNTER — Inpatient Hospital Stay
Admit: 2016-11-30 | Discharge: 2016-11-30 | Disposition: A | Discharge status: Home / Self Care | Payer: PPO | Attending: Adult Health | Admitting: Adult Health

## 2016-11-30 ENCOUNTER — Other Ambulatory Visit: Payer: Self-pay | Admitting: *Deleted

## 2016-11-30 DIAGNOSIS — G4733 Obstructive sleep apnea (adult) (pediatric): Secondary | ICD-10-CM

## 2016-11-30 DIAGNOSIS — J9621 Acute and chronic respiratory failure with hypoxia: Principal | ICD-10-CM

## 2016-11-30 DIAGNOSIS — J449 Chronic obstructive pulmonary disease, unspecified: Secondary | ICD-10-CM

## 2016-11-30 DIAGNOSIS — J9622 Acute and chronic respiratory failure with hypercapnia: Secondary | ICD-10-CM

## 2016-11-30 DIAGNOSIS — G4719 Other hypersomnia: Secondary | ICD-10-CM

## 2016-11-30 MED ORDER — PREDNISONE 50 MG PO TABS
50.0000 mg | ORAL_TABLET | Freq: Every day | ORAL | Status: DC
  Administered 2016-12-01: 11:00:00 50 mg via ORAL
  Filled 2016-11-30: qty 1

## 2016-11-30 NOTE — Telephone Encounter (Signed)
-----   Message from Wilhelmina Mcardle, MD sent at 11/30/2016 11:05 AM EDT ----- Please schedule a CPAP titration study for him, then F/U with Ashby Dawes for OSA

## 2016-11-30 NOTE — Progress Notes (Signed)
Patient refuses bed and chair alarm. Patient and family educated on the purpose of alarms.

## 2016-11-30 NOTE — Progress Notes (Signed)
[place] at [place] NAME: Chris Lozano    MR#:  [id number]  DATE OF BIRTH:  [birth year]  SUBJECTIVE:  CHIEF COMPLAINT:  Patient shortness of breath is better off BiPAP. Doing much better,getting transferred to the floor today Wife and daughter at bedside Patient uses CPAP at bedtime at home,pulmonology has recommended outpatient sleep study after discharge  REVIEW OF SYSTEMS:  CONSTITUTIONAL: No fever, fatigue or weakness.  EYES: No blurred or double vision.  EARS, NOSE, AND THROAT: No tinnitus or ear pain.  RESPIRATORY: Improving cough, shortness of breath, denies wheezing or hemoptysis.  CARDIOVASCULAR: No chest pain, orthopnea, edema.  GASTROINTESTINAL: No nausea, vomiting, diarrhea or abdominal pain.  GENITOURINARY: No dysuria, hematuria.  ENDOCRINE: No polyuria, nocturia,  HEMATOLOGY: No anemia, easy bruising or bleeding SKIN: No rash or lesion. MUSCULOSKELETAL: No joint pain or arthritis.   NEUROLOGIC: No tingling, numbness, weakness.  PSYCHIATRY: No anxiety or depression.   DRUG ALLERGIES:   Allergies  Allergen Reactions  . Ciprocinonide [Fluocinolone] Other (See Comments)    Reaction: dizzines, "drunk"    VITALS:  Blood pressure (!) 98/54, pulse 74, temperature 97.7 F (36.5 C), temperature source Oral, resp. rate 18, height 5\' 6"  (1.676 m), weight 79.1 kg (174 lb 6.4 oz), SpO2 93 %.  PHYSICAL EXAMINATION:  GENERAL:  76 y.o.-year-old patient lying in the bed with no acute distress.  EYES: Pupils equal, round, reactive to light and accommodation. No scleral icterus. Extraocular muscles intact.  HEENT: Head atraumatic, normocephalic. Oropharynx and nasopharynx clear.  NECK:  Supple, no jugular venous distention. No thyroid enlargement, no tenderness.  LUNGS:  moderately diminished breath sounds bilaterally, no wheezing, rales,rhonchi or crepitation. No use of accessory muscles of respiration.  CARDIOVASCULAR: S1, S2  normal. No murmurs, rubs, or gallops.  ABDOMEN: Soft, nontender, nondistended. Bowel sounds present. No organomegaly or mass.  EXTREMITIES: No pedal edema, cyanosis, or clubbing.  NEUROLOGIC: Cranial nerves II through XII are intact. Muscle strength 5/5 in all extremities. Sensation intact. Gait not checked.  PSYCHIATRIC: The patient is alert and oriented x 3.  SKIN: No obvious rash, lesion, or ulcer.    LABORATORY PANEL:   CBC  Recent Labs Lab 11/29/16 0416  WBC 7.5  HGB 12.3*  HCT 38.8*  PLT 143*   ------------------------------------------------------------------------------------------------------------------  Chemistries   Recent Labs Lab 11/29/16 0416  NA 138  K 3.8  CL 96*  CO2 34*  GLUCOSE 168*  BUN 12  CREATININE 1.12  CALCIUM 8.3*   ------------------------------------------------------------------------------------------------------------------  Cardiac Enzymes  Recent Labs Lab 11/28/16 2148  TROPONINI <0.03   ------------------------------------------------------------------------------------------------------------------  RADIOLOGY:  Dg Chest Port 1 View  Result Date: 11/28/2016 CLINICAL DATA:  Acute onset of dyspnea. EXAM: PORTABLE CHEST 1 VIEW COMPARISON:  01/16/2012 FINDINGS: Portable AP upright view of the chest. Borderline cardiomegaly. Tortuous thoracic aorta with minimal aortic atherosclerosis. No aneurysm. Mild interstitial edema without significant pleural effusion. No pneumonic consolidation or pneumothorax. IMPRESSION: Borderline cardiomegaly with mild interstitial edema. Electronically Signed   By: Ashley Royalty M.D.   On: 11/28/2016 22:23    EKG:   Orders placed or performed during the hospital encounter of 11/28/16  . ED EKG  . ED EKG  . EKG 12-Lead  . EKG 12-Lead    ASSESSMENT AND PLAN:     #Acute respiratory failure with hypoxia and hypercapnia (HCC) - secondary to COPD exacerbation  patient is clinically improving  off  the BiPAP.  Taper IV steroids  Completed azithromycin  BiPAP as needed and follow up with pulmonology  Outpatient follow-up with Dr. Isidore Moos for sleep study PCCM signed off    COPD with acute exacerbation ([place]) - taper IV Solu-Medrol and completed azithromycin, when necessary duo nebs, continue home inhalers    CAD (coronary artery disease) - continue home meds    Sleep apnea - BiPAP while here. Daughter states patient is on CPAP at home, but is not sure that it is entirely effective. She states she will follow up with patient's pulmonologist to see if he would benefit from BiPAP at home     GERD (gastroesophageal reflux disease) - H2 blocker    All the records are reviewed and case discussed with Care Management/Social Workerr. Management plans discussed with the patient, family and they are in agreement.  CODE STATUS: fc   TOTAL TIME TAKING CARE OF THIS PATIENT: 35  minutes.   POSSIBLE D/C IN 1-2 DAYS, DEPENDING ON CLINICAL CONDITION.  Note: This dictation was prepared with Dragon dictation along with smaller phrase technology. Any transcriptional errors that result from this process are unintentional.   Nicholes Mango M.D on 11/30/2016 at 1:36 PM  Between 7am to 6pm - Pager - [phone] After 6pm go to [email or web address] - password [email or web address] [facility]  [place] [facility]  Office  [phone]  CC: Primary care physician; Adin Hector, MD

## 2016-11-30 NOTE — Progress Notes (Signed)
Patient ambulated around unit, oxygen monitored. Oxygen sats dropped to 86% while walking on 3L, at rest patient sats 90-91% on 3L.

## 2016-11-30 NOTE — Progress Notes (Signed)
Report called to [facility] RN on 1A. Family informed of transfer to 126.

## 2016-11-30 NOTE — Progress Notes (Addendum)
Patient refuses bed alarm at this time, patient educated about bed alarms and safety.

## 2016-11-30 NOTE — Progress Notes (Signed)
Comfortable on nasal cannula oxygen No new complaints He and his family are very concerned about treatment of sleep apnea He is presently wearing CPAP at night but does not feel well rested and has severe daytime hypersomnolence. He naps several times throughout the day.  Vitals:   11/30/16 0700 11/30/16 0800 11/30/16 0900 11/30/16 1134  BP: 103/66 103/68 106/63 (!) 98/54  Pulse: 60 75 69 74  Resp: 20 (!) 22 (!) 26 18  Temp:    97.7 F (36.5 C)  TempSrc:    Oral  SpO2: 99% 99% 90% 93%  Weight:    79.1 kg (174 lb 6.4 oz)  Height:    5\' 6"  (1.676 m)  3 LPM [place]  NAD HEENT WNL JVP not visualized Markedly diminished BS, no wheezes Regular, no M NABS, soft No ankle or pedal edema No focal neurologic deficits  BMP Latest Ref Rng & Units 11/29/2016 11/28/2016 01/18/2012  Glucose 65 - 99 mg/dL 168(H) 107(H) 101(H)  BUN 6 - 20 mg/dL 12 13 10   Creatinine 0.61 - 1.24 mg/dL 1.12 1.24 0.84  Sodium 135 - 145 mmol/L 138 138 142  Potassium 3.5 - 5.1 mmol/L 3.8 3.6 3.8  Chloride 101 - 111 mmol/L 96(L) 94(L) 104  CO2 22 - 32 mmol/L 34(H) 35(H) 35(H)  Calcium 8.9 - 10.3 mg/dL 8.3(L) 8.6(L) 8.2(L)    CBC Latest Ref Rng & Units 11/29/2016 11/28/2016 01/17/2012  WBC 3.8 - 10.6 K/uL 7.5 8.5 9.2  Hemoglobin 13.0 - 18.0 g/dL 12.3(L) 13.1 15.2  Hematocrit 40.0 - 52.0 % 38.8(L) 41.5 46.2  Platelets 150 - 440 K/uL 143(L) 151 100(L)    No new CXR  IMPRESSION: 1) Acute on chronic respiratory failure with hypercarbia/hypoxemia 2) Severe COPD - managed at [facility] as outpatient 3) OSA - seems to be inadequately treated. Notes persistent snoring despite CPAP and severe daytime hypersomnolence  PLAN/REC: Continue Symbicort, Spiriva, PRN nebulized albuterol Continue nocturnal CPAP Transfer to MedSurg I have requested CPAP titration study to be performed after discharge and follow-up with Dr. Ashby Dawes for sleep apnea  After transfer, PCCM will sign off. Please call if we can be of further  assistance    Merton Border, MD PCCM service Mobile [phone] Pager [phone] 11/30/2016 1:36 PM

## 2016-11-30 NOTE — Telephone Encounter (Signed)
Due to insurance unable to order CPAP titration. Per DR will order split night due to this reason.

## 2016-11-30 NOTE — Progress Notes (Signed)
*  PRELIMINARY RESULTS* Echocardiogram 2D Echocardiogram has been performed.  Sherrie Sport 11/30/2016, 2:42 PM

## 2016-12-01 LAB — ECHOCARDIOGRAM COMPLETE
Height: 66 in
Weight: 2790.4 oz

## 2016-12-01 MED ORDER — FOLIC ACID 1 MG PO TABS: 1.0000 mg | ORAL_TABLET | Freq: Every day | ORAL | Status: DC

## 2016-12-01 MED ORDER — PREDNISONE 10 MG (21) PO TBPK: 10.0000 mg | ORAL_TABLET | Freq: Every day | ORAL | 0 refills | Status: DC

## 2016-12-01 MED ORDER — ADULT MULTIVITAMIN W/MINERALS CH: 1.0000 | ORAL_TABLET | Freq: Every day | ORAL | Status: DC

## 2016-12-01 MED ORDER — GUAIFENESIN-DM 100-10 MG/5ML PO SYRP: 10.0000 mL | ORAL_SOLUTION | Freq: Four times a day (QID) | ORAL | 0 refills | Status: DC | PRN

## 2016-12-01 MED ORDER — THIAMINE HCL 100 MG PO TABS: 100.0000 mg | ORAL_TABLET | Freq: Every day | ORAL | Status: DC

## 2016-12-01 NOTE — Discharge Instructions (Signed)
Follow-up with primary care physician in one week Follow-up with pulmonology Dr. Ashby Dawes in 1 week for outpatient sleep study Follow-up with primary pulmonology in 1 month

## 2016-12-01 NOTE — Progress Notes (Signed)
PT Cancellation Note  Patient Details Name: Chris Lozano MRN: [id number] DOB: Jul 18, 1940   Cancelled Treatment:    Reason Eval/Treat Not Completed: Other (comment).   Refused PT for now and educated pt that he is permitted to ask again if he changes his mind.  Has O2 on at home and walked around the nurses station yesterday, and will let PT know if he feels like he has declined functionally.     Ramond Dial 12/01/2016, 9:52 AM   Mee Hives, PT MS Acute Rehab Dept. Number: [place]

## 2016-12-01 NOTE — Discharge Summary (Signed)
[facility] [place] NAME: Chris Lozano    MR#:  [id number]  DATE OF BIRTH:  [birth year]  DATE OF ADMISSION:  11/28/2016 ADMITTING PHYSICIAN: Lance Coon, MD  DATE OF DISCHARGE: 12/01/16  PRIMARY CARE PHYSICIAN: Adin Hector, MD    ADMISSION DIAGNOSIS:  Shortness of breath [R06.02] Acute and chronic respiratory failure with hypoxia (HCC) [J96.21]  DISCHARGE DIAGNOSIS:  Principal Problem:   Acute respiratory failure with hypoxia and hypercapnia (HCC) Active Problems:   COPD with acute exacerbation (HCC)   CAD (coronary artery disease)   HLD (hyperlipidemia)   Sleep apnea   GERD (gastroesophageal reflux disease)   Acute on chronic respiratory failure with hypoxia and hypercapnia ([place])   SECONDARY DIAGNOSIS:   Past Medical History:  Diagnosis Date  . Asthma   . BPH (benign prostatic hyperplasia)   . CAD (coronary artery disease)   . COPD (chronic obstructive pulmonary disease) ([place])   . GERD (gastroesophageal reflux disease)   . HLD (hyperlipidemia)   . Sleep apnea     HOSPITAL COURSE:   HPI Chris Lozano  is a 76 y.o. male who presents with neck and shortness of breath, and some somnolence. Patient's daughter states that patient has been sleeping more than normal, and he has had episodes of significant somnolence. Patient states that he woke up this afternoon and his wife told him that he had slept too long and at discharge. When he came here to the ED he had significant wheezing, and significant work of effort for breathing. He was placed on BiPAP. Initial workup corresponds with COPD exacerbation. He did also have an elevated CO2 level on his venous blood gas at 93. Hospitalists were called for admission   #Acute respiratory failure with hypoxia and hypercapnia ([place]) - secondary to COPD exacerbation  patient is clinically improving, wants to go home today  off the BiPAP.  Tapered  IV steroids to po  Completed  azithromycin  BiPAP as needed and follow up with pulmonology  Outpatient follow-up with Dr. Isidore Moos for sleep study and then follow-up with primary pulmonologist PCCM signed off  COPD with acute exacerbation ([place]) - taper IV Solu-Medrol to po and completed azithromycin, when necessary duo nebs, continue home inhalers  CAD (coronary artery disease) - continue home meds  Sleep apnea - BiPAP while here. Daughter states patient is on CPAP at home, but is not sure that it is entirely effective. She states she will follow up with patient's pulmonologist to see if he would benefit from BiPAP at home   GERD (gastroesophageal reflux disease) - H2 blocker      DISCHARGE CONDITIONS:   STABLE  CONSULTS OBTAINED:     PROCEDURES BIPAP  DRUG ALLERGIES:   Allergies  Allergen Reactions  . Ciprocinonide [Fluocinolone] Other (See Comments)    Reaction: dizzines, "drunk"    DISCHARGE MEDICATIONS:   Current Discharge Medication List    START taking these medications   Details  folic acid (FOLVITE) 1 MG tablet Take 1 tablet (1 mg total) by mouth daily.    guaiFENesin-dextromethorphan (ROBITUSSIN DM) 100-10 MG/5ML syrup Take 10 mLs by mouth every 6 (six) hours as needed for cough. Qty: 118 mL, Refills: 0    Multiple Vitamin (MULTIVITAMIN WITH MINERALS) TABS tablet Take 1 tablet by mouth daily.    predniSONE (STERAPRED UNI-PAK 21 TAB) 10 MG (21) TBPK tablet Take 1 tablet (10 mg total) by mouth daily. Take 6 tablets by mouth for  1 day followed by  5 tablets by mouth for 1 day followed by  4 tablets by mouth for 1 day followed by  3 tablets by mouth for 1 day followed by  2 tablets by mouth for 1 day followed by  1 tablet by mouth for a day and stop Qty: 21 tablet, Refills: 0    thiamine 100 MG tablet Take 1 tablet (100 mg total) by mouth daily.      CONTINUE these medications which have NOT CHANGED   Details  albuterol (PROVENTIL HFA;VENTOLIN HFA) 108 (90 Base)  MCG/ACT inhaler Inhale 2 puffs into the lungs every 6 (six) hours as needed for wheezing or shortness of breath.     aspirin EC 81 MG tablet Take 81 mg by mouth daily.    atorvastatin (LIPITOR) 80 MG tablet Take 80 mg by mouth daily.    budesonide-formoterol (SYMBICORT) 160-4.5 MCG/ACT inhaler Inhale 2 puffs into the lungs 2 (two) times daily.    hydrochlorothiazide (MICROZIDE) 12.5 MG capsule Take 12.5 mg by mouth daily.    mirabegron ER (MYRBETRIQ) 25 MG TB24 tablet Take 1 tablet (25 mg total) by mouth daily. Qty: 90 tablet, Refills: 11    ranitidine (ZANTAC) 150 MG capsule Take 150 mg by mouth 2 (two) times daily as needed for heartburn.     sildenafil (REVATIO) 20 MG tablet Take 3 to 5 tablets two hours before intercouse on an empty stomach.  Do not take with nitrates. Qty: 50 tablet, Refills: 3    tiotropium (SPIRIVA HANDIHALER) 18 MCG inhalation capsule PLACE 1 CAPSULE (18 MCG TOTAL) INTO INHALER AND INHALE DAILY      STOP taking these medications     FLUZONE HIGH-DOSE 0.5 ML injection          DISCHARGE INSTRUCTIONS:  Follow-up with primary care physician in one week Follow-up with pulmonology Dr. Ashby Dawes in 1 week for outpatient sleep study Follow-up with primary pulmonology in 1 month    DIET:  Cardiac diet  DISCHARGE CONDITION:  Fair  ACTIVITY:  Activity as tolerated  OXYGEN:  Home Oxygen: Yes.     Oxygen Delivery: 3 liters/min via Patient connected to nasal cannula oxygen  DISCHARGE LOCATION:  home   If you experience worsening of your admission symptoms, develop shortness of breath, life threatening emergency, suicidal or homicidal thoughts you must seek medical attention immediately by calling 911 or calling your MD immediately  if symptoms less severe.  You Must read complete instructions/literature along with all the possible adverse reactions/side effects for all the Medicines you take and that have been prescribed to you. Take any new  Medicines after you have completely understood and accpet all the possible adverse reactions/side effects.   Please note  You were cared for by a hospitalist during your hospital stay. If you have any questions about your discharge medications or the care you received while you were in the hospital after you are discharged, you can call the unit and asked to speak with the hospitalist on call if the hospitalist that took care of you is not available. Once you are discharged, your primary care physician will handle any further medical issues. Please note that NO REFILLS for any discharge medications will be authorized once you are discharged, as it is imperative that you return to your primary care physician (or establish a relationship with a primary care physician if you do not have one) for your aftercare needs so that they can reassess your need for medications  and monitor your lab values.     Today  Chief Complaint  Patient presents with  . Shortness of Breath   Pt is doing fine , continue CPAP daily at bedtime  ROS:  CONSTITUTIONAL: Denies fevers, chills. Denies any fatigue, weakness.  EYES: Denies blurry vision, double vision, eye pain. EARS, NOSE, THROAT: Denies tinnitus, ear pain, hearing loss. RESPIRATORY: Denies cough, wheeze, shortness of breath.  CARDIOVASCULAR: Denies chest pain, palpitations, edema.  GASTROINTESTINAL: Denies nausea, vomiting, diarrhea, abdominal pain. Denies bright red blood per rectum. GENITOURINARY: Denies dysuria, hematuria. ENDOCRINE: Denies nocturia or thyroid problems. HEMATOLOGIC AND LYMPHATIC: Denies easy bruising or bleeding. SKIN: Denies rash or lesion. MUSCULOSKELETAL: Denies pain in neck, back, shoulder, knees, hips or arthritic symptoms.  NEUROLOGIC: Denies paralysis, paresthesias.  PSYCHIATRIC: Denies anxiety or depressive symptoms.   VITAL SIGNS:  Blood pressure 110/63, pulse (!) 51, temperature 97.7 F (36.5 C), temperature source Oral,  resp. rate 15, height 5\' 6"  (1.676 m), weight 79.1 kg (174 lb 6.4 oz), SpO2 94 %.  I/O:    Intake/Output Summary (Last 24 hours) at 12/01/16 1419 Last data filed at 12/01/16 1355  Gross per 24 hour  Intake              660 ml  Output                0 ml  Net              660 ml    PHYSICAL EXAMINATION:  GENERAL:  76 y.o.-year-old patient lying in the bed with no acute distress.  EYES: Pupils equal, round, reactive to light and accommodation. No scleral icterus. Extraocular muscles intact.  HEENT: Head atraumatic, normocephalic. Oropharynx and nasopharynx clear.  NECK:  Supple, no jugular venous distention. No thyroid enlargement, no tenderness.  LUNGS: Normal breath sounds bilaterally, no wheezing, rales,rhonchi or crepitation. No use of accessory muscles of respiration.  CARDIOVASCULAR: S1, S2 normal. No murmurs, rubs, or gallops.  ABDOMEN: Soft, non-tender, non-distended. Bowel sounds present. No organomegaly or mass.  EXTREMITIES: No pedal edema, cyanosis, or clubbing.  NEUROLOGIC: Cranial nerves II through XII are intact. Muscle strength 5/5 in all extremities. Sensation intact. Gait not checked.  PSYCHIATRIC: The patient is alert and oriented x 3.  SKIN: No obvious rash, lesion, or ulcer.   DATA REVIEW:   CBC  Recent Labs Lab 11/29/16 0416  WBC 7.5  HGB 12.3*  HCT 38.8*  PLT 143*    Chemistries   Recent Labs Lab 11/29/16 0416  NA 138  K 3.8  CL 96*  CO2 34*  GLUCOSE 168*  BUN 12  CREATININE 1.12  CALCIUM 8.3*    Cardiac Enzymes  Recent Labs Lab 11/28/16 2148  TROPONINI <0.03    Microbiology Results  Results for orders placed or performed during the hospital encounter of 11/28/16  MRSA PCR Screening     Status: None   Collection Time: 11/29/16 12:24 AM  Result Value Ref Range Status   MRSA by PCR NEGATIVE NEGATIVE Final    Comment:        The GeneXpert MRSA Assay (FDA approved for NASAL specimens only), is one component of a comprehensive  MRSA colonization surveillance program. It is not intended to diagnose MRSA infection nor to guide or monitor treatment for MRSA infections.     RADIOLOGY:  Dg Chest Port 1 View  Result Date: 11/28/2016 CLINICAL DATA:  Acute onset of dyspnea. EXAM: PORTABLE CHEST 1 VIEW COMPARISON:  01/16/2012 FINDINGS: Portable  AP upright view of the chest. Borderline cardiomegaly. Tortuous thoracic aorta with minimal aortic atherosclerosis. No aneurysm. Mild interstitial edema without significant pleural effusion. No pneumonic consolidation or pneumothorax. IMPRESSION: Borderline cardiomegaly with mild interstitial edema. Electronically Signed   By: Ashley Royalty M.D.   On: 11/28/2016 22:23    EKG:   Orders placed or performed during the hospital encounter of 11/28/16  . ED EKG  . ED EKG  . EKG 12-Lead  . EKG 12-Lead      Management plans discussed with the patient, family and they are in agreement.  CODE STATUS:     Code Status Orders        Start     Ordered   11/29/16 0021  Full code  Continuous     11/29/16 0020    Code Status History    Date Active Date Inactive Code Status Order ID Comments User Context   This patient has a current code status but no historical code status.      TOTAL TIME TAKING CARE OF THIS PATIENT: 45 minutes.   Note: This dictation was prepared with Dragon dictation along with smaller phrase technology. Any transcriptional errors that result from this process are unintentional.   @M[facility] @  on 12/01/2016 at 2:19 PM  Between 7am to 6pm - Pager - [phone]  After 6pm go to [email or web address] - password [email or web address] [facility]  [facility]  Office  [phone]  CC: Primary care physician; Adin Hector, MD

## 2016-12-01 NOTE — Progress Notes (Signed)
Pt. Discharged following review of discharge instructions and mediciation. IV removed. All question addressed. Pt discharged in care of wife, daughter and son in law. Pt. Switched to portable oxygen tank brought in by family

## 2016-12-01 NOTE — Care Management Important Message (Signed)
Important Message  Patient Details  Name: BEKIM WERNTZ MRN: [id number] Date of Birth: [birth year]   Medicare Important Message Given:  Yes    Jolly Mango, RN 12/01/2016, 3:06 PM

## 2016-12-02 LAB — BLOOD GAS, VENOUS
Acid-Base Excess: 13.6 mmol/L — ABNORMAL HIGH (ref 0.0–2.0)
Bicarbonate: 44.7 mmol/L — ABNORMAL HIGH (ref 20.0–28.0)
Delivery systems: POSITIVE
FIO2: 0.3
PATIENT TEMPERATURE: 37
PCO2 VEN: 93 mmHg — AB (ref 44.0–60.0)
pH, Ven: 7.29 (ref 7.250–7.430)

## 2016-12-13 DIAGNOSIS — F3342 Major depressive disorder, recurrent, in full remission: Secondary | ICD-10-CM | POA: Diagnosis not present

## 2016-12-13 DIAGNOSIS — E782 Mixed hyperlipidemia: Secondary | ICD-10-CM | POA: Diagnosis not present

## 2016-12-13 DIAGNOSIS — I2510 Atherosclerotic heart disease of native coronary artery without angina pectoris: Secondary | ICD-10-CM | POA: Diagnosis not present

## 2016-12-13 DIAGNOSIS — J438 Other emphysema: Secondary | ICD-10-CM | POA: Diagnosis not present

## 2016-12-13 DIAGNOSIS — I280 Arteriovenous fistula of pulmonary vessels: Secondary | ICD-10-CM | POA: Diagnosis not present

## 2016-12-13 DIAGNOSIS — G4733 Obstructive sleep apnea (adult) (pediatric): Secondary | ICD-10-CM | POA: Diagnosis not present

## 2016-12-13 DIAGNOSIS — I700 Atherosclerosis of aorta: Secondary | ICD-10-CM | POA: Diagnosis not present

## 2016-12-13 DIAGNOSIS — D509 Iron deficiency anemia, unspecified: Secondary | ICD-10-CM | POA: Diagnosis not present

## 2016-12-13 DIAGNOSIS — I739 Peripheral vascular disease, unspecified: Secondary | ICD-10-CM | POA: Diagnosis not present

## 2016-12-13 DIAGNOSIS — J9611 Chronic respiratory failure with hypoxia: Secondary | ICD-10-CM | POA: Diagnosis not present

## 2016-12-16 ENCOUNTER — Ambulatory Visit (INDEPENDENT_AMBULATORY_CARE_PROVIDER_SITE_OTHER): Payer: PPO | Admitting: Internal Medicine

## 2016-12-16 ENCOUNTER — Encounter: Payer: Self-pay | Admitting: Internal Medicine

## 2016-12-16 VITALS — BP 118/80 | HR 60 | Resp 16 | Ht 66.0 in | Wt 178.0 lb

## 2016-12-16 DIAGNOSIS — G4730 Sleep apnea, unspecified: Secondary | ICD-10-CM

## 2016-12-16 DIAGNOSIS — J9612 Chronic respiratory failure with hypercapnia: Secondary | ICD-10-CM | POA: Diagnosis not present

## 2016-12-16 DIAGNOSIS — J9611 Chronic respiratory failure with hypoxia: Secondary | ICD-10-CM | POA: Diagnosis not present

## 2016-12-16 DIAGNOSIS — J449 Chronic obstructive pulmonary disease, unspecified: Secondary | ICD-10-CM | POA: Diagnosis not present

## 2016-12-16 MED ORDER — BUDESONIDE-FORMOTEROL FUMARATE 160-4.5 MCG/ACT IN AERO: 2.0000 | INHALATION_SPRAY | Freq: Two times a day (BID) | RESPIRATORY_TRACT | 10 refills | Status: DC

## 2016-12-16 NOTE — Patient Instructions (Signed)
--  Will refer to mask fitting clinic.   --Once your are using cpap every night we will check an oxygen test over night on CPAP without oxygen to see if you need oxygen at night with CPAP.   --Will refer to pulmonary rehab.

## 2016-12-16 NOTE — Progress Notes (Signed)
[facility] Consultation      Assessment and Plan:  The patient is a 76 year old male with severe emphysema, obstructive sleep apnea, evidence of CO2 retention.  He is on 3 L of oxygen chronically.  COPD/emphysema with dyspnea on exertion. -Severe COPD emphysema with CO2 retention/hypercapnia. - Discussed with family our goal will be to preserve his lung function, and increase his exercise tolerance as much as possible. - Will refer to pulmonary rehab, I asked that he continue his Symbicort and Spiriva, Symbicort caption was renewed today.  We also discussed trying to increase his activity at home, he was ambulated around the office today and had desats with activity but minimal dyspnea.  Obstructive sleep apnea with hypercapnia. -Discussed importance using CPAP every night, particularly in regards to his elevated CO2 levels. - He is currently not using his CPAP due to mask issues, will refer to mask fitting clinic to help him get a new mask. -Discussed with family once he is using CPAP the duration of the night we will check an overnight oximetry on CPAP and room air to see if he requires supplemental oxygen with his CPAP.  Given his severe emphysema, and chronic hypercapnia I would probably tolerate some degree of nocturnal hypoxemia.  Meds ordered this encounter  Medications  . budesonide-formoterol (SYMBICORT) 160-4.5 MCG/ACT inhaler    Sig: Inhale 2 puffs 2 (two) times daily into the lungs.    Dispense:  1 Inhaler    Refill:  10   Orders Placed This Encounter  Procedures  . AMB referral to pulmonary rehabilitation  . Desensitization mask fit   Return in about 2 months (around 02/15/2017).   Date: 12/16/2016  MRN# [id number] Chris Lozano [birth year]   CEPHUS TUPY is a 76 y.o. old male seen in consultation for chief complaint of:    Chief Complaint  Patient presents with  . Hospitalization Follow-up    pt on 3L 02 spouse reports lots of sob with  exhertion, cough- non productive    HPI:   The patient is a 76 year old male with history of COPD, he was in the hospital recently for 3 days, discharged on 12/01/16 for COPD exacerbation and was seen by our service at that time.  At that time it was noted that his CO2 level was elevated on his venous blood gas to 93.  He is maintained on Symbicort, Spiriva, he has a CPAP at home for obstructive sleep apnea and uses 3 L of oxygen chronically.  He has been following up with pulmonary [facility]. He is present with his wife and daughter who gives some of the history.   Before this happened, he was minimally active, he rarely left the house. He is not smoking. He is using symbicort 2 puffs twice daily, he is using spiriva once daily. He feels like they help. He has never been through pulmonary rehab.  He has a cpap he only uses it about half the night because the mask comes off, he does not use oxygen with CPAP.   Imaging personally reviewed, chest x-ray 11/28/16; hyperinflation consistent with emphysema, changes of chronic bronchitis  Desat walk on 12/16/16; at rest on 3L sat was 92% and HR 72. After walking 180 feet sat was 86% and HR 88.    Results for MILLARD, BAUTCH (MRN [id number]) as of 12/14/2016 16:05  Ref. Range 01/18/2012 04:21 09/28/2016 14:52 11/28/2016 21:48 11/29/2016 00:24 11/29/2016 04:16  CO2 Latest Ref Range: 22 - 32 mmol/L 35 (  H)  35 (H)  34 (H)   Date: FVC (% Pred) FEV1 (% Pred) Pre-BD FEV1 (%Pred) Post- BD FEV1/FVC FEF25-75(% Pred) DLCO (% Pred)  December 12, 2013 1.83 L (50%) 0.93 L (35%) 1.32 (50%) 51 0.29 (15%) 45%    PMHX:   Past Medical History:  Diagnosis Date  . Asthma   . BPH (benign prostatic hyperplasia)   . CAD (coronary artery disease)   . COPD (chronic obstructive pulmonary disease) ([place])   . GERD (gastroesophageal reflux disease)   . HLD (hyperlipidemia)   . Sleep apnea    Surgical Hx:  Past Surgical History:  Procedure Laterality Date  . ADENOIDECTOMY     . CARDIAC CATHETERIZATION    . GALLBLADDER SURGERY    . TONSILLECTOMY     Family Hx:  Family History  Problem Relation Age of Onset  . Heart disease Unknown   . Kidney disease Neg Hx   . Prostate cancer Neg Hx   . Kidney cancer Neg Hx   . Bladder Cancer Neg Hx    Social Hx:   Social History   Tobacco Use  . Smoking status: Former Research scientist (life sciences)  . Smokeless tobacco: Former Systems developer    Types: Chew  . Tobacco comment: as a teenager  Substance Use Topics  . Alcohol use: Yes    Alcohol/week: 0.0 oz    Comment: occasionally  . Drug use: No   Medication:    Current Outpatient Medications:  .  albuterol (PROVENTIL HFA;VENTOLIN HFA) 108 (90 Base) MCG/ACT inhaler, Inhale 2 puffs into the lungs every 6 (six) hours as needed for wheezing or shortness of breath. , Disp: , Rfl:  .  aspirin EC 81 MG tablet, Take 81 mg by mouth daily., Disp: , Rfl:  .  atorvastatin (LIPITOR) 80 MG tablet, Take 80 mg by mouth daily., Disp: , Rfl:  .  budesonide-formoterol (SYMBICORT) 160-4.5 MCG/ACT inhaler, Inhale 2 puffs into the lungs 2 (two) times daily., Disp: , Rfl:  .  folic acid (FOLVITE) 1 MG tablet, Take 1 tablet (1 mg total) by mouth daily., Disp: , Rfl:  .  guaiFENesin-dextromethorphan (ROBITUSSIN DM) 100-10 MG/5ML syrup, Take 10 mLs by mouth every 6 (six) hours as needed for cough., Disp: 118 mL, Rfl: 0 .  hydrochlorothiazide (MICROZIDE) 12.5 MG capsule, Take 12.5 mg by mouth daily., Disp: , Rfl:  .  mirabegron ER (MYRBETRIQ) 25 MG TB24 tablet, Take 1 tablet (25 mg total) by mouth daily., Disp: 90 tablet, Rfl: 11 .  Multiple Vitamin (MULTIVITAMIN WITH MINERALS) TABS tablet, Take 1 tablet by mouth daily., Disp: , Rfl:  .  ranitidine (ZANTAC) 150 MG capsule, Take 150 mg by mouth 2 (two) times daily as needed for heartburn. , Disp: , Rfl:  .  sildenafil (REVATIO) 20 MG tablet, Take 3 to 5 tablets two hours before intercouse on an empty stomach.  Do not take with nitrates., Disp: 50 tablet, Rfl: 3 .  thiamine  100 MG tablet, Take 1 tablet (100 mg total) by mouth daily., Disp: , Rfl:  .  tiotropium (SPIRIVA HANDIHALER) 18 MCG inhalation capsule, PLACE 1 CAPSULE (18 MCG TOTAL) INTO INHALER AND INHALE DAILY, Disp: , Rfl:  .  predniSONE (STERAPRED UNI-PAK 21 TAB) 10 MG (21) TBPK tablet, Take 1 tablet (10 mg total) by mouth daily. Take 6 tablets by mouth for 1 day followed by  5 tablets by mouth for 1 day followed by  4 tablets by mouth for 1 day followed by  3  tablets by mouth for 1 day followed by  2 tablets by mouth for 1 day followed by  1 tablet by mouth for a day and stop (Patient not taking: Reported on 12/16/2016), Disp: 21 tablet, Rfl: 0   Allergies:  Ciprocinonide [fluocinolone]  Review of Systems: Gen:  Denies  fever, sweats, chills HEENT: Denies blurred vision, double vision. bleeds, sore throat Cvc:  No dizziness, chest pain. Resp:   Denies cough or sputum production, shortness of breath Gi: Denies swallowing difficulty, stomach pain. Gu:  Denies bladder incontinence, burning urine Ext:   No Joint pain, stiffness. Skin: No skin rash,  hives  Endoc:  No polyuria, polydipsia. Psych: No depression, insomnia. Other:  All other systems were reviewed with the patient and were negative other that what is mentioned in the HPI.   Physical Examination:   VS: BP 118/80 (BP Location: Left Arm, Cuff Size: Normal)   Pulse 60   Resp 16   Ht 5\' 6"  (1.676 m)   Wt 178 lb (80.7 kg)   SpO2 90%   BMI 28.73 kg/m   General Appearance: No distress  Neuro:without focal findings,  speech normal,  HEENT: PERRLA, EOM intact.   Pulmonary: normal breath sounds, No wheezing.  CardiovascularNormal S1,S2.  No m/r/g.   Abdomen: Benign, Soft, non-tender. Renal:  No costovertebral tenderness  GU:  No performed at this time. Endoc: No evident thyromegaly, no signs of acromegaly. Skin:   warm, no rashes, no ecchymosis  Extremities: normal, no cyanosis, clubbing.  Other findings:    LABORATORY PANEL:    CBC No results for input(s): WBC, HGB, HCT, PLT in the last 168 hours. ------------------------------------------------------------------------------------------------------------------  Chemistries  No results for input(s): NA, K, CL, CO2, GLUCOSE, BUN, CREATININE, CALCIUM, MG, AST, ALT, ALKPHOS, BILITOT in the last 168 hours.  Invalid input(s): GFRCGP ------------------------------------------------------------------------------------------------------------------  Cardiac Enzymes No results for input(s): TROPONINI in the last 168 hours. ------------------------------------------------------------  RADIOLOGY:  No results found.     Thank  you for the consultation and for allowing [facility] to assist in the care of your patient. Our recommendations are noted above.  Please contact us if we can be of further service.   Marda Stalker, MD.  Board Certified in Internal Medicine, Pulmonary Medicine, [place], and Sleep Medicine.  [facility] Office Number: [phone]  Patricia Pesa, M.D.  Merton Border, M.D  12/16/2016

## 2016-12-21 ENCOUNTER — Ambulatory Visit: Payer: PPO | Attending: Internal Medicine

## 2016-12-21 DIAGNOSIS — G4733 Obstructive sleep apnea (adult) (pediatric): Secondary | ICD-10-CM | POA: Insufficient documentation

## 2016-12-24 ENCOUNTER — Telehealth: Payer: Self-pay | Admitting: *Deleted

## 2016-12-24 DIAGNOSIS — G4733 Obstructive sleep apnea (adult) (pediatric): Secondary | ICD-10-CM

## 2016-12-24 NOTE — Telephone Encounter (Signed)
Pt aware sleep study. Bi-pap orders submitted.

## 2016-12-28 DIAGNOSIS — D509 Iron deficiency anemia, unspecified: Secondary | ICD-10-CM | POA: Diagnosis not present

## 2017-01-04 ENCOUNTER — Ambulatory Visit (HOSPITAL_BASED_OUTPATIENT_CLINIC_OR_DEPARTMENT_OTHER): Payer: PPO | Attending: Internal Medicine | Admitting: Radiology

## 2017-01-04 DIAGNOSIS — G4730 Sleep apnea, unspecified: Secondary | ICD-10-CM

## 2017-02-02 ENCOUNTER — Telehealth: Payer: Self-pay | Admitting: Internal Medicine

## 2017-02-02 NOTE — Telephone Encounter (Signed)
Called patient's daughter and gave the information below:  This patient's PAP order was processed on 11/19 and his appointment was scheduled for 12/11 in our [place] branch. My RT team went over his financial responsibility with him at that time.  The pt did not show up for his appointment. On 12/11, we called him to reschedule and advised that RT would not be able to see him for another few weeks.  No new appt has been made at this time. He can call the RT team to set up his appt if he would like to. The number is [phone] ext [phone].   Nothing further needed at this time.

## 2017-02-02 NOTE — Telephone Encounter (Signed)
[place] patient daughter calling stating pt was to get a BIPAP machine  But they have not heard anything as of now   Would like an update on this Please call back

## 2017-02-17 DIAGNOSIS — J45998 Other asthma: Secondary | ICD-10-CM | POA: Diagnosis not present

## 2017-02-17 DIAGNOSIS — G4733 Obstructive sleep apnea (adult) (pediatric): Secondary | ICD-10-CM | POA: Diagnosis not present

## 2017-02-17 DIAGNOSIS — J449 Chronic obstructive pulmonary disease, unspecified: Secondary | ICD-10-CM | POA: Diagnosis not present

## 2017-02-17 DIAGNOSIS — R0902 Hypoxemia: Secondary | ICD-10-CM | POA: Diagnosis not present

## 2017-02-17 DIAGNOSIS — R5381 Other malaise: Secondary | ICD-10-CM | POA: Diagnosis not present

## 2017-02-17 DIAGNOSIS — J438 Other emphysema: Secondary | ICD-10-CM | POA: Diagnosis not present

## 2017-02-17 DIAGNOSIS — R0602 Shortness of breath: Secondary | ICD-10-CM | POA: Diagnosis not present

## 2017-02-23 ENCOUNTER — Telehealth: Payer: Self-pay | Admitting: Internal Medicine

## 2017-02-23 NOTE — Telephone Encounter (Signed)
Patient family has downloaded the app to go with [facility] cpap and it is requiring a md email to set up.  Please advise family .  They are unable to bypass this setting .

## 2017-02-23 NOTE — Telephone Encounter (Signed)
When I spoke with EC she states they had tried to download the wrong app. She got the correct app. Nothing further needed.

## 2017-03-14 DIAGNOSIS — H40153 Residual stage of open-angle glaucoma, bilateral: Secondary | ICD-10-CM | POA: Diagnosis not present

## 2017-03-18 ENCOUNTER — Encounter: Payer: Self-pay | Admitting: Internal Medicine

## 2017-03-20 DIAGNOSIS — J438 Other emphysema: Secondary | ICD-10-CM | POA: Diagnosis not present

## 2017-03-20 DIAGNOSIS — J45998 Other asthma: Secondary | ICD-10-CM | POA: Diagnosis not present

## 2017-03-20 DIAGNOSIS — R0902 Hypoxemia: Secondary | ICD-10-CM | POA: Diagnosis not present

## 2017-03-20 DIAGNOSIS — R0602 Shortness of breath: Secondary | ICD-10-CM | POA: Diagnosis not present

## 2017-03-20 DIAGNOSIS — J449 Chronic obstructive pulmonary disease, unspecified: Secondary | ICD-10-CM | POA: Diagnosis not present

## 2017-03-20 DIAGNOSIS — G4733 Obstructive sleep apnea (adult) (pediatric): Secondary | ICD-10-CM | POA: Diagnosis not present

## 2017-03-20 DIAGNOSIS — R5381 Other malaise: Secondary | ICD-10-CM | POA: Diagnosis not present

## 2017-03-25 NOTE — Progress Notes (Signed)
[facility] Consultation      Assessment and Plan:  The patient is a 77 year old male with severe emphysema, obstructive sleep apnea, evidence of CO2 retention.  He is on 3 L of oxygen chronically.  COPD/emphysema with dyspnea on exertion. -Severe COPD emphysema with CO2 retention/hypercapnia. - Discussed with family our goal will be to preserve his lung function, and increase his exercise tolerance as much as possible. -Discussed that the best treatment to try to keep him out of the hospital would be to continue his medications as prescribed, try to increase his activity slightly, and use BiPAP every night for the entire night. -Continue 3 L with BiPAP.  Obstructive sleep apnea with hypercapnia. -Discussed importance using BiPAP every night, particularly in regards to his elevated CO2 levels. - He is currently not using his CPAP due to mask issues, will refer to mask fitting clinic to help him get a new mask. -Given his severe emphysema, and chronic hypercapnia I would probably tolerate some degree of nocturnal hypoxemia.  Advance care planning. Discussed patient's severe baseline respiratory status, he is at high risk of decompensation and hospital admission due to his severe emphysema and hypoxic respiratory failure with chronic hypercapnia.  We discussed that if his heart were to stop or if he would require mechanical ventilation, it would be difficult to wean him off the ventilator. He states that if he were to die he would not want to be brought back, he would not want to be placed on a ventilator or undergo CPR.  He states this in the presence of his family, and the he notes that he is spoken with them about this before, he has a living will in place.  We discussed that his CODE STATUS that would be DNR, and he is in agreement with this.   Return in about 3 months (around 06/25/2017).   Date: 03/25/2017  MRN# [id number] Chris Lozano 03/30/1940   Chris Lozano  is a 77 y.o. old male seen in consultation for chief complaint of:    Chief Complaint  Patient presents with  . Sleep Apnea    Pt wears bi-pap but is not wearing 4 hrs every night. He is using every night.  Marland Kitchen COPD    HPI:   The patient is a 77 year old male with history of COPD, he was in the hospital recently for 3 days, discharged on 12/01/16 for COPD exacerbation and was seen by our service at that time.  At that time it was noted that his CO2 level was elevated on his venous blood gas to 93.  His home CPAP was then switched to a BiPAP. He is present with his wife and daughter who gives some of the history.  He is taking symbicort 2 puffs once per day, but forgets the evening dose. She often forgets the spiriva dose. He think he gets winded with moderate activity.   He did not go through pulmonary rehab because of degenerative joint disease, with chronic back pain.  Since his last visit he has been started on bipap due to hypercapnia. He has been taking off his Bipap in the middle the night due to headaches. He remains on the 3L of oxygen via POC. He feels that it is more comfortable he has trouble getting it to seal and occasionally throws it on the floor. Looking at his phone app, on days he uses it most of the night, his AHI is well controlled but is high if he  does not use it the whole night. He is using 3L with bipap.   **Download for the past 30 days, as of 03/27/17, usage greater than 4 hours is 17/30 days.  Average use is on days used is 4 3 9  minutes.  Setting is BiPAP auto, maximum IPAP of 16, minimum EPAP is 10, pressure support 5 residual AHI is 12.4.  However on days where use of the BiPAP for the entire night his AHI dropped significantly.  Before this happened, he was minimally active, he rarely left the house. He is not smoking. He is using symbicort 2 puffs twice daily, he is using spiriva once daily. He feels like they help. He has never been through pulmonary rehab.  He has  a cpap he only uses it about half the night because the mask comes off, he does not use oxygen with CPAP.   **chest x-ray 11/28/16; hyperinflation consistent with emphysema, changes of chronic bronchitis **Desat walk on 12/16/16; at rest on 3L sat was 92% and HR 72. After walking 180 feet sat was 86% and HR 88.    Results for JARELL, MCEWEN (MRN [id number]) as of 12/14/2016 16:05  Ref. Range 01/18/2012 04:21 09/28/2016 14:52 11/28/2016 21:48 11/29/2016 00:24 11/29/2016 04:16  CO2 Latest Ref Range: 22 - 32 mmol/L 35 (H)  35 (H)  34 (H)   Date: FVC (% Pred) FEV1 (% Pred) Pre-BD FEV1 (%Pred) Post- BD FEV1/FVC FEF25-75(% Pred) DLCO (% Pred)  December 12, 2013 1.83 L (50%) 0.93 L (35%) 1.32 (50%) 51 0.29 (15%) 45%   Social Hx:   Social History   Tobacco Use  . Smoking status: Former Research scientist (life sciences)  . Smokeless tobacco: Former Systems developer    Types: Chew  . Tobacco comment: as a teenager  Substance Use Topics  . Alcohol use: Yes    Alcohol/week: 0.0 oz    Comment: occasionally  . Drug use: No   Medication:    Current Outpatient Medications:  .  albuterol (PROVENTIL HFA;VENTOLIN HFA) 108 (90 Base) MCG/ACT inhaler, Inhale 2 puffs into the lungs every 6 (six) hours as needed for wheezing or shortness of breath. , Disp: , Rfl:  .  aspirin EC 81 MG tablet, Take 81 mg by mouth daily., Disp: , Rfl:  .  atorvastatin (LIPITOR) 80 MG tablet, Take 80 mg by mouth daily., Disp: , Rfl:  .  budesonide-formoterol (SYMBICORT) 160-4.5 MCG/ACT inhaler, Inhale 2 puffs 2 (two) times daily into the lungs., Disp: 1 Inhaler, Rfl: 10 .  folic acid (FOLVITE) 1 MG tablet, Take 1 tablet (1 mg total) by mouth daily., Disp: , Rfl:  .  guaiFENesin-dextromethorphan (ROBITUSSIN DM) 100-10 MG/5ML syrup, Take 10 mLs by mouth every 6 (six) hours as needed for cough., Disp: 118 mL, Rfl: 0 .  hydrochlorothiazide (MICROZIDE) 12.5 MG capsule, Take 12.5 mg by mouth daily., Disp: , Rfl:  .  mirabegron ER (MYRBETRIQ) 25 MG TB24 tablet, Take 1  tablet (25 mg total) by mouth daily., Disp: 90 tablet, Rfl: 11 .  Multiple Vitamin (MULTIVITAMIN WITH MINERALS) TABS tablet, Take 1 tablet by mouth daily., Disp: , Rfl:  .  predniSONE (STERAPRED UNI-PAK 21 TAB) 10 MG (21) TBPK tablet, Take 1 tablet (10 mg total) by mouth daily. Take 6 tablets by mouth for 1 day followed by  5 tablets by mouth for 1 day followed by  4 tablets by mouth for 1 day followed by  3 tablets by mouth for 1 day followed by  2 tablets by  mouth for 1 day followed by  1 tablet by mouth for a day and stop (Patient not taking: Reported on 12/16/2016), Disp: 21 tablet, Rfl: 0 .  ranitidine (ZANTAC) 150 MG capsule, Take 150 mg by mouth 2 (two) times daily as needed for heartburn. , Disp: , Rfl:  .  sildenafil (REVATIO) 20 MG tablet, Take 3 to 5 tablets two hours before intercouse on an empty stomach.  Do not take with nitrates., Disp: 50 tablet, Rfl: 3 .  thiamine 100 MG tablet, Take 1 tablet (100 mg total) by mouth daily., Disp: , Rfl:  .  tiotropium (SPIRIVA HANDIHALER) 18 MCG inhalation capsule, PLACE 1 CAPSULE (18 MCG TOTAL) INTO INHALER AND INHALE DAILY, Disp: , Rfl:    Allergies:  Fluocinolone  Review of Systems: Gen:  Denies  fever, sweats, chills HEENT: Denies blurred vision, double vision. bleeds, sore throat Cvc:  No dizziness, chest pain. Resp:   Denies cough or sputum production, shortness of breath Gi: Denies swallowing difficulty, stomach pain. Gu:  Denies bladder incontinence, burning urine Ext:   No Joint pain, stiffness. Skin: No skin rash,  hives  Endoc:  No polyuria, polydipsia. Psych: No depression, insomnia. Other:  All other systems were reviewed with the patient and were negative other that what is mentioned in the HPI.   Physical Examination:   VS: BP 128/88 (BP Location: Left Arm, Cuff Size: Large)   Pulse 90   Resp 16   Ht 5\' 6"  (1.676 m)   Wt 184 lb (83.5 kg)   SpO2 95%   BMI 29.70 kg/m   General Appearance: No distress  Neuro:without  focal findings,  speech normal,  HEENT: PERRLA, EOM intact.   Pulmonary: normal breath sounds, No wheezing.  CardiovascularNormal S1,S2.  No m/r/g.   Abdomen: Benign, Soft, non-tender. Renal:  No costovertebral tenderness  GU:  No performed at this time. Endoc: No evident thyromegaly, no signs of acromegaly. Skin:   warm, no rashes, no ecchymosis  Extremities: normal, no cyanosis, clubbing.  Other findings:    LABORATORY PANEL:   CBC No results for input(s): WBC, HGB, HCT, PLT in the last 168 hours. ------------------------------------------------------------------------------------------------------------------  Chemistries  No results for input(s): NA, K, CL, CO2, GLUCOSE, BUN, CREATININE, CALCIUM, MG, AST, ALT, ALKPHOS, BILITOT in the last 168 hours.  Invalid input(s): GFRCGP ------------------------------------------------------------------------------------------------------------------  Cardiac Enzymes No results for input(s): TROPONINI in the last 168 hours. ------------------------------------------------------------  RADIOLOGY:  No results found.     Thank  you for the consultation and for allowing [facility] to assist in the care of your patient. Our recommendations are noted above.  Please contact us if we can be of further service.   Marda Stalker, MD.  Board Certified in Internal Medicine, Pulmonary Medicine, [place], and Sleep Medicine.  [facility] Office Number: [phone]  Patricia Pesa, M.D.  Merton Border, M.D  03/28/2017

## 2017-03-27 ENCOUNTER — Encounter: Payer: Self-pay | Admitting: Internal Medicine

## 2017-03-28 ENCOUNTER — Ambulatory Visit: Payer: PPO | Admitting: Internal Medicine

## 2017-03-28 ENCOUNTER — Encounter: Payer: Self-pay | Admitting: Internal Medicine

## 2017-03-28 VITALS — BP 128/88 | HR 90 | Resp 16 | Ht 66.0 in | Wt 184.0 lb

## 2017-03-28 DIAGNOSIS — J449 Chronic obstructive pulmonary disease, unspecified: Secondary | ICD-10-CM | POA: Diagnosis not present

## 2017-03-28 DIAGNOSIS — G4733 Obstructive sleep apnea (adult) (pediatric): Secondary | ICD-10-CM | POA: Diagnosis not present

## 2017-03-28 DIAGNOSIS — J9612 Chronic respiratory failure with hypercapnia: Secondary | ICD-10-CM

## 2017-03-28 DIAGNOSIS — J9611 Chronic respiratory failure with hypoxia: Secondary | ICD-10-CM

## 2017-03-28 NOTE — Patient Instructions (Signed)
Remember to use Symbicort twice daily, and Spiriva once daily. Try to use her BiPAP every night for the entire night. Call us back if you have any respiratory problems.

## 2017-04-01 DIAGNOSIS — N400 Enlarged prostate without lower urinary tract symptoms: Secondary | ICD-10-CM | POA: Diagnosis not present

## 2017-04-01 DIAGNOSIS — I2510 Atherosclerotic heart disease of native coronary artery without angina pectoris: Secondary | ICD-10-CM | POA: Diagnosis not present

## 2017-04-01 DIAGNOSIS — I739 Peripheral vascular disease, unspecified: Secondary | ICD-10-CM | POA: Diagnosis not present

## 2017-04-01 DIAGNOSIS — D509 Iron deficiency anemia, unspecified: Secondary | ICD-10-CM | POA: Diagnosis not present

## 2017-04-01 DIAGNOSIS — E782 Mixed hyperlipidemia: Secondary | ICD-10-CM | POA: Diagnosis not present

## 2017-04-01 DIAGNOSIS — R739 Hyperglycemia, unspecified: Secondary | ICD-10-CM | POA: Diagnosis not present

## 2017-04-12 DIAGNOSIS — E782 Mixed hyperlipidemia: Secondary | ICD-10-CM | POA: Diagnosis not present

## 2017-04-12 DIAGNOSIS — I700 Atherosclerosis of aorta: Secondary | ICD-10-CM | POA: Diagnosis not present

## 2017-04-12 DIAGNOSIS — J8410 Pulmonary fibrosis, unspecified: Secondary | ICD-10-CM | POA: Diagnosis not present

## 2017-04-12 DIAGNOSIS — R140 Abdominal distension (gaseous): Secondary | ICD-10-CM | POA: Diagnosis not present

## 2017-04-12 DIAGNOSIS — J9611 Chronic respiratory failure with hypoxia: Secondary | ICD-10-CM | POA: Diagnosis not present

## 2017-04-12 DIAGNOSIS — G4733 Obstructive sleep apnea (adult) (pediatric): Secondary | ICD-10-CM | POA: Diagnosis not present

## 2017-04-12 DIAGNOSIS — I2510 Atherosclerotic heart disease of native coronary artery without angina pectoris: Secondary | ICD-10-CM | POA: Diagnosis not present

## 2017-04-12 DIAGNOSIS — I280 Arteriovenous fistula of pulmonary vessels: Secondary | ICD-10-CM | POA: Diagnosis not present

## 2017-04-12 DIAGNOSIS — D509 Iron deficiency anemia, unspecified: Secondary | ICD-10-CM | POA: Diagnosis not present

## 2017-04-12 DIAGNOSIS — Z86010 Personal history of colonic polyps: Secondary | ICD-10-CM | POA: Diagnosis not present

## 2017-04-12 DIAGNOSIS — I739 Peripheral vascular disease, unspecified: Secondary | ICD-10-CM | POA: Diagnosis not present

## 2017-04-12 DIAGNOSIS — Z0000 Encounter for general adult medical examination without abnormal findings: Secondary | ICD-10-CM | POA: Diagnosis not present

## 2017-04-12 DIAGNOSIS — D72828 Other elevated white blood cell count: Secondary | ICD-10-CM | POA: Diagnosis not present

## 2017-04-12 DIAGNOSIS — F3342 Major depressive disorder, recurrent, in full remission: Secondary | ICD-10-CM | POA: Diagnosis not present

## 2017-04-12 DIAGNOSIS — R066 Hiccough: Secondary | ICD-10-CM | POA: Diagnosis not present

## 2017-04-12 DIAGNOSIS — J438 Other emphysema: Secondary | ICD-10-CM | POA: Diagnosis not present

## 2017-04-17 DIAGNOSIS — G4733 Obstructive sleep apnea (adult) (pediatric): Secondary | ICD-10-CM | POA: Diagnosis not present

## 2017-04-17 DIAGNOSIS — J438 Other emphysema: Secondary | ICD-10-CM | POA: Diagnosis not present

## 2017-04-17 DIAGNOSIS — R0902 Hypoxemia: Secondary | ICD-10-CM | POA: Diagnosis not present

## 2017-04-17 DIAGNOSIS — R5381 Other malaise: Secondary | ICD-10-CM | POA: Diagnosis not present

## 2017-04-17 DIAGNOSIS — J449 Chronic obstructive pulmonary disease, unspecified: Secondary | ICD-10-CM | POA: Diagnosis not present

## 2017-04-17 DIAGNOSIS — J45998 Other asthma: Secondary | ICD-10-CM | POA: Diagnosis not present

## 2017-04-17 DIAGNOSIS — R0602 Shortness of breath: Secondary | ICD-10-CM | POA: Diagnosis not present

## 2017-05-10 DIAGNOSIS — E876 Hypokalemia: Secondary | ICD-10-CM | POA: Diagnosis not present

## 2017-05-10 DIAGNOSIS — D72828 Other elevated white blood cell count: Secondary | ICD-10-CM | POA: Diagnosis not present

## 2017-05-17 DIAGNOSIS — I739 Peripheral vascular disease, unspecified: Secondary | ICD-10-CM | POA: Diagnosis not present

## 2017-05-17 DIAGNOSIS — Z8669 Personal history of other diseases of the nervous system and sense organs: Secondary | ICD-10-CM | POA: Diagnosis not present

## 2017-05-17 DIAGNOSIS — I2510 Atherosclerotic heart disease of native coronary artery without angina pectoris: Secondary | ICD-10-CM | POA: Diagnosis not present

## 2017-05-17 DIAGNOSIS — J9611 Chronic respiratory failure with hypoxia: Secondary | ICD-10-CM | POA: Diagnosis not present

## 2017-05-17 DIAGNOSIS — J8410 Pulmonary fibrosis, unspecified: Secondary | ICD-10-CM | POA: Diagnosis not present

## 2017-05-17 DIAGNOSIS — J438 Other emphysema: Secondary | ICD-10-CM | POA: Diagnosis not present

## 2017-05-17 DIAGNOSIS — G4733 Obstructive sleep apnea (adult) (pediatric): Secondary | ICD-10-CM | POA: Diagnosis not present

## 2017-05-17 DIAGNOSIS — I700 Atherosclerosis of aorta: Secondary | ICD-10-CM | POA: Diagnosis not present

## 2017-05-17 DIAGNOSIS — I280 Arteriovenous fistula of pulmonary vessels: Secondary | ICD-10-CM | POA: Diagnosis not present

## 2017-05-17 DIAGNOSIS — E782 Mixed hyperlipidemia: Secondary | ICD-10-CM | POA: Diagnosis not present

## 2017-05-17 DIAGNOSIS — F3342 Major depressive disorder, recurrent, in full remission: Secondary | ICD-10-CM | POA: Diagnosis not present

## 2017-05-17 DIAGNOSIS — N400 Enlarged prostate without lower urinary tract symptoms: Secondary | ICD-10-CM | POA: Diagnosis not present

## 2017-05-18 DIAGNOSIS — R5381 Other malaise: Secondary | ICD-10-CM | POA: Diagnosis not present

## 2017-05-18 DIAGNOSIS — J449 Chronic obstructive pulmonary disease, unspecified: Secondary | ICD-10-CM | POA: Diagnosis not present

## 2017-05-18 DIAGNOSIS — R0602 Shortness of breath: Secondary | ICD-10-CM | POA: Diagnosis not present

## 2017-05-18 DIAGNOSIS — J45998 Other asthma: Secondary | ICD-10-CM | POA: Diagnosis not present

## 2017-05-18 DIAGNOSIS — R0902 Hypoxemia: Secondary | ICD-10-CM | POA: Diagnosis not present

## 2017-05-18 DIAGNOSIS — J438 Other emphysema: Secondary | ICD-10-CM | POA: Diagnosis not present

## 2017-05-18 DIAGNOSIS — G4733 Obstructive sleep apnea (adult) (pediatric): Secondary | ICD-10-CM | POA: Diagnosis not present

## 2017-06-16 ENCOUNTER — Encounter: Payer: Self-pay | Admitting: Urology

## 2017-06-16 ENCOUNTER — Ambulatory Visit: Payer: PPO | Admitting: Urology

## 2017-06-16 VITALS — BP 117/69 | HR 49 | Resp 16 | Ht 66.0 in | Wt 180.0 lb

## 2017-06-16 DIAGNOSIS — L729 Follicular cyst of the skin and subcutaneous tissue, unspecified: Secondary | ICD-10-CM

## 2017-06-16 DIAGNOSIS — N138 Other obstructive and reflux uropathy: Secondary | ICD-10-CM | POA: Diagnosis not present

## 2017-06-16 DIAGNOSIS — N401 Enlarged prostate with lower urinary tract symptoms: Secondary | ICD-10-CM | POA: Diagnosis not present

## 2017-06-16 LAB — URINALYSIS, COMPLETE
BILIRUBIN UA: NEGATIVE
GLUCOSE, UA: NEGATIVE
KETONES UA: NEGATIVE
Leukocytes, UA: NEGATIVE
Nitrite, UA: NEGATIVE
PROTEIN UA: NEGATIVE
RBC, UA: NEGATIVE
Specific Gravity, UA: 1.015 (ref 1.005–1.030)
UUROB: 2 mg/dL — AB (ref 0.2–1.0)
pH, UA: 6 (ref 5.0–7.5)

## 2017-06-16 NOTE — Progress Notes (Signed)
06/16/2017 1:50 PM   Chris Lozano Dec 05, 1940 [id number]  Referring provider: Adin Hector, MD [facility] [place], [place] [address]  Chief Complaint  Patient presents with  . Other    HPI: 77 year old male followed by Zara Council for BPH with lower urinary tract symptoms.  He presents today stating he develops intermittent cyst on his scrotum.  He developed a cyst approximately 1 week ago which became swollen and tender and the size he describes is about 2 cm.  He states the area has almost resolved but is not sure that it drained spontaneously.   PMH: Past Medical History:  Diagnosis Date  . Asthma   . BPH (benign prostatic hyperplasia)   . CAD (coronary artery disease)   . COPD (chronic obstructive pulmonary disease) ([place])   . GERD (gastroesophageal reflux disease)   . HLD (hyperlipidemia)   . Sleep apnea     Surgical History: Past Surgical History:  Procedure Laterality Date  . ADENOIDECTOMY    . CARDIAC CATHETERIZATION    . GALLBLADDER SURGERY    . TONSILLECTOMY      Home Medications:  Allergies as of 06/16/2017      Reactions   Fluocinolone Other (See Comments)   Reaction: dizzines, "drunk" Other reaction(s): Unknown      Medication List        Accurate as of 06/16/17  1:50 PM. Always use your most recent med list.          albuterol 108 (90 Base) MCG/ACT inhaler Commonly known as:  PROVENTIL HFA;VENTOLIN HFA Inhale 2 puffs into the lungs every 6 (six) hours as needed for wheezing or shortness of breath.   aspirin EC 81 MG tablet Take 81 mg by mouth daily.   atorvastatin 80 MG tablet Commonly known as:  LIPITOR Take 80 mg by mouth daily.   budesonide-formoterol 160-4.5 MCG/ACT inhaler Commonly known as:  SYMBICORT Inhale 2 puffs 2 (two) times daily into the lungs.   guaiFENesin-dextromethorphan 100-10 MG/5ML syrup Commonly known as:  ROBITUSSIN DM Take 10 mLs by mouth every 6 (six) hours as needed for  cough.   hydrochlorothiazide 12.5 MG capsule Commonly known as:  MICROZIDE Take 12.5 mg by mouth daily.   mirabegron ER 25 MG Tb24 tablet Commonly known as:  MYRBETRIQ Take 1 tablet (25 mg total) by mouth daily.   multivitamin with minerals Tabs tablet Take 1 tablet by mouth daily.   SPIRIVA HANDIHALER 18 MCG inhalation capsule Generic drug:  tiotropium PLACE 1 CAPSULE (18 MCG TOTAL) INTO INHALER AND INHALE DAILY   thiamine 100 MG tablet Take 1 tablet (100 mg total) by mouth daily.       Allergies:  Allergies  Allergen Reactions  . Fluocinolone Other (See Comments)    Reaction: dizzines, "drunk" Other reaction(s): Unknown    Family History: Family History  Problem Relation Age of Onset  . Heart disease Unknown   . Kidney disease Neg Hx   . Prostate cancer Neg Hx   . Kidney cancer Neg Hx   . Bladder Cancer Neg Hx     Social History:  reports that he has quit smoking. He has quit using smokeless tobacco. His smokeless tobacco use included chew. He reports that he drinks alcohol. He reports that he does not use drugs.  ROS: UROLOGY Frequent Urination?: Yes Hard to postpone urination?: No Burning/pain with urination?: No Get up at night to urinate?: Yes Leakage of urine?: Yes Urine stream starts  and stops?: No Trouble starting stream?: No Do you have to strain to urinate?: No Blood in urine?: No Urinary tract infection?: No Sexually transmitted disease?: No Injury to kidneys or bladder?: No Painful intercourse?: No Weak stream?: No Erection problems?: Yes Penile pain?: No  Gastrointestinal Nausea?: No Vomiting?: No Indigestion/heartburn?: No Diarrhea?: No Constipation?: No  Constitutional Fever: No Night sweats?: No Weight loss?: No Fatigue?: Yes  Skin Skin rash/lesions?: No Itching?: No  Eyes Blurred vision?: No Double vision?: No  Ears/Nose/Throat Sore throat?: No Sinus problems?: No  Hematologic/Lymphatic Swollen glands?: No Easy  bruising?: No  Cardiovascular Leg swelling?: No Chest pain?: No  Respiratory Cough?: No Shortness of breath?: Yes  Endocrine Excessive thirst?: No  Musculoskeletal Back pain?: Yes Joint pain?: No  Neurological Headaches?: No Dizziness?: No  Psychologic Depression?: Yes Anxiety?: No  Physical Exam: BP 117/69   Pulse (!) 49   Resp 16   Ht 5\' 6"  (1.676 m)   Wt 180 lb (81.6 kg)   SpO2 98%   BMI 29.05 kg/m   Constitutional:  Alert and oriented, No acute distress. HEENT: [place] AT, moist mucus membranes.  Trachea midline, no masses. Cardiovascular: No clubbing, cyanosis, or edema. Respiratory: Normal respiratory effort, no increased work of breathing. GI: Abdomen is soft, nontender, nondistended, no abdominal masses GU: No CVA tenderness.  In the left hemiscrotum near midline is a slightly indurated area with a central scab consistent with a spontaneously drained infected inclusion cyst. Lymph: No cervical or inguinal lymphadenopathy. Skin: No rashes, bruises or suspicious lesions. Neurologic: Grossly intact, no focal deficits, moving all 4 extremities. Psychiatric: Normal mood and affect.  Laboratory Data: Lab Results  Component Value Date   WBC 7.5 11/29/2016   HGB 12.3 (L) 11/29/2016   HCT 38.8 (L) 11/29/2016   MCV 86.5 11/29/2016   PLT 143 (L) 11/29/2016    Lab Results  Component Value Date   CREATININE 1.12 11/29/2016     Assessment & Plan:   77 year old male with a recent spontaneously drained epidermal inclusion cyst of the scrotum.  He was instructed to follow-up as needed recurrent cysts.  He has a follow-up scheduled with Larene Beach in September 2019.  At his last visit a testosterone level was recommended that he states he did not get drawn and wanted to have this drawn today.  Abbie Sons, [place] [address] [place], [place] [address] [phone]

## 2017-06-17 ENCOUNTER — Telehealth: Payer: Self-pay

## 2017-06-17 DIAGNOSIS — J45998 Other asthma: Secondary | ICD-10-CM | POA: Diagnosis not present

## 2017-06-17 DIAGNOSIS — J438 Other emphysema: Secondary | ICD-10-CM | POA: Diagnosis not present

## 2017-06-17 DIAGNOSIS — G4733 Obstructive sleep apnea (adult) (pediatric): Secondary | ICD-10-CM | POA: Diagnosis not present

## 2017-06-17 DIAGNOSIS — R0602 Shortness of breath: Secondary | ICD-10-CM | POA: Diagnosis not present

## 2017-06-17 DIAGNOSIS — R0902 Hypoxemia: Secondary | ICD-10-CM | POA: Diagnosis not present

## 2017-06-17 DIAGNOSIS — R5381 Other malaise: Secondary | ICD-10-CM | POA: Diagnosis not present

## 2017-06-17 DIAGNOSIS — J449 Chronic obstructive pulmonary disease, unspecified: Secondary | ICD-10-CM | POA: Diagnosis not present

## 2017-06-17 LAB — TESTOSTERONE: TESTOSTERONE: 355 ng/dL (ref 264–916)

## 2017-06-17 NOTE — Telephone Encounter (Signed)
Lm for call back.

## 2017-06-17 NOTE — Telephone Encounter (Signed)
Patient returned call and was notified of lab results.

## 2017-06-17 NOTE — Telephone Encounter (Signed)
-----   Message from Abbie Sons, MD sent at 06/17/2017  7:35 AM EDT ----- Testosterone level was normal

## 2017-07-18 DIAGNOSIS — R5381 Other malaise: Secondary | ICD-10-CM | POA: Diagnosis not present

## 2017-07-18 DIAGNOSIS — G4733 Obstructive sleep apnea (adult) (pediatric): Secondary | ICD-10-CM | POA: Diagnosis not present

## 2017-07-18 DIAGNOSIS — J449 Chronic obstructive pulmonary disease, unspecified: Secondary | ICD-10-CM | POA: Diagnosis not present

## 2017-07-18 DIAGNOSIS — R0602 Shortness of breath: Secondary | ICD-10-CM | POA: Diagnosis not present

## 2017-07-18 DIAGNOSIS — J438 Other emphysema: Secondary | ICD-10-CM | POA: Diagnosis not present

## 2017-07-18 DIAGNOSIS — J45998 Other asthma: Secondary | ICD-10-CM | POA: Diagnosis not present

## 2017-07-18 DIAGNOSIS — R0902 Hypoxemia: Secondary | ICD-10-CM | POA: Diagnosis not present

## 2017-07-19 DIAGNOSIS — I2510 Atherosclerotic heart disease of native coronary artery without angina pectoris: Secondary | ICD-10-CM | POA: Diagnosis not present

## 2017-07-19 DIAGNOSIS — G4733 Obstructive sleep apnea (adult) (pediatric): Secondary | ICD-10-CM | POA: Diagnosis not present

## 2017-07-19 DIAGNOSIS — E669 Obesity, unspecified: Secondary | ICD-10-CM | POA: Diagnosis not present

## 2017-07-19 DIAGNOSIS — Q2739 Arteriovenous malformation, other site: Secondary | ICD-10-CM | POA: Diagnosis not present

## 2017-07-19 DIAGNOSIS — Z87891 Personal history of nicotine dependence: Secondary | ICD-10-CM | POA: Diagnosis not present

## 2017-07-19 DIAGNOSIS — Z79899 Other long term (current) drug therapy: Secondary | ICD-10-CM | POA: Diagnosis not present

## 2017-07-19 DIAGNOSIS — Z7951 Long term (current) use of inhaled steroids: Secondary | ICD-10-CM | POA: Diagnosis not present

## 2017-07-19 DIAGNOSIS — Z7982 Long term (current) use of aspirin: Secondary | ICD-10-CM | POA: Diagnosis not present

## 2017-07-19 DIAGNOSIS — J439 Emphysema, unspecified: Secondary | ICD-10-CM | POA: Diagnosis not present

## 2017-07-19 DIAGNOSIS — Z9981 Dependence on supplemental oxygen: Secondary | ICD-10-CM | POA: Diagnosis not present

## 2017-07-19 DIAGNOSIS — Z122 Encounter for screening for malignant neoplasm of respiratory organs: Secondary | ICD-10-CM | POA: Diagnosis not present

## 2017-07-19 DIAGNOSIS — Z9049 Acquired absence of other specified parts of digestive tract: Secondary | ICD-10-CM | POA: Diagnosis not present

## 2017-07-19 DIAGNOSIS — F17210 Nicotine dependence, cigarettes, uncomplicated: Secondary | ICD-10-CM | POA: Diagnosis not present

## 2017-07-27 NOTE — Progress Notes (Signed)
[name]    Assessment and Plan:  The patient is a 77 year old male with severe emphysema, obstructive sleep apnea, evidence of CO2 retention.  He is on 3 L of oxygen chronically.  COPD/emphysema with dyspnea on exertion. -Severe COPD emphysema with CO2 retention/hypercapnia. -Continue 3 L with BiPAP.  Obstructive sleep apnea with hypercapnia. -Discussed importance using BiPAP every night, particularly in regards to his elevated CO2 levels. - Patient is Chris Lozano been to mask fitting clinic.  Advance care planning. Discussed patient's severe baseline respiratory status, he is at high risk of decompensation and hospital admission due to his severe emphysema and hypoxic respiratory failure with chronic hypercapnia.  We discussed that if his heart were to stop or if he would require mechanical ventilation, it would be difficult to wean him off the ventilator. He states that if he were to die he would not want to be brought back, he would not want to be placed on a ventilator or undergo CPR.  He states this in the presence of his family, and the he notes that he is spoken with them about this before, he has a living will in place.  We discussed that his CODE STATUS that would be DNR, and he is in agreement with this.   Return in about 6 months (around 02/02/2018).   Date: 07/27/2017  MRN# [id number] Chris Lozano [birth year]   Chris Lozano is a 77 y.o. old male seen in consultation for chief complaint of:    Chief Complaint  Patient presents with  . Sleep Apnea    unable to tolerate CPAP well  . COPD    SOB w/activty: chest tightness    HPI:   The patient is a 77 year old male with history of COPD, with hypoventilation on bipap.  He is taking symbicort 2 puffs once per day, but forgets the evening dose. She often forgets the spiriva dose. He think he gets winded with moderate activity.  He was started on BiPAP due to hypoventilation, at his last visit his  compliance was poor with residual elevated AHI. He seems to take it off in the middle of the night and does not remember. He leaves it on the whole night and he does not know why some nights he can wear it the whole night and not on others.    **Download data 07/04/2017 to 08/02/2017>> personally reviewed usage greater than 4 hours of 6/30 days.  Average usage on days used is 2 hours 40 minutes.  V auto setting max IPAP 16 minimum EPAP 10 pressure support 5, residual AHI 17.6, predominantly obstructive.  This shows very poor compliance with CPAP, with inadequate control of obstructive sleep apnea **chest x-ray 11/28/16; hyperinflation consistent with emphysema, changes of chronic bronchitis **Desat walk on 12/16/16; at rest on 3L sat was 92% and HR 72. After walking 180 feet sat was 86% and HR 88.  **Download for the past 30 days, as of 03/27/17, usage greater than 4 hours is 17/30 days.  Average use is on days used is 4 3 9  minutes.  Setting is BiPAP auto, maximum IPAP of 16, minimum EPAP is 10, pressure support 5 residual AHI is 12.4.  However on days where use of the BiPAP for the entire night his AHI dropped significantly.   Results for Chris Lozano, Chris Lozano (MRN [id number]) as of 12/14/2016 16:05  Ref. Range 01/18/2012 04:21 09/28/2016 14:52 11/28/2016 21:48 11/29/2016 00:24 11/29/2016 04:16  CO2 Latest Ref Range: 22 - 32 mmol/L 35 (  H)  35 (H)  34 (H)   Date: FVC (% Pred) FEV1 (% Pred) Pre-BD FEV1 (%Pred) Post- BD FEV1/FVC FEF25-75(% Pred) DLCO (% Pred)  December 12, 2013 1.83 L (50%) 0.93 L (35%) 1.32 (50%) 51 0.29 (15%) 45%   Social Hx:   Social History   Tobacco Use  . Smoking status: Former Research scientist (life sciences)  . Smokeless tobacco: Former Systems developer    Types: Chew  . Tobacco comment: as a teenager  Substance Use Topics  . Alcohol use: Yes    Alcohol/week: 0.0 oz    Comment: occasionally  . Drug use: No   Medication:    Current Outpatient Medications:  .  albuterol (PROVENTIL HFA;VENTOLIN HFA) 108 (90 Base)  MCG/ACT inhaler, Inhale 2 puffs into the lungs every 6 (six) hours as needed for wheezing or shortness of breath. , Disp: , Rfl:  .  aspirin EC 81 MG tablet, Take 81 mg by mouth daily., Disp: , Rfl:  .  atorvastatin (LIPITOR) 80 MG tablet, Take 80 mg by mouth daily., Disp: , Rfl:  .  budesonide-formoterol (SYMBICORT) 160-4.5 MCG/ACT inhaler, Inhale 2 puffs 2 (two) times daily into the lungs., Disp: 1 Inhaler, Rfl: 10 .  guaiFENesin-dextromethorphan (ROBITUSSIN DM) 100-10 MG/5ML syrup, Take 10 mLs by mouth every 6 (six) hours as needed for cough., Disp: 118 mL, Rfl: 0 .  hydrochlorothiazide (MICROZIDE) 12.5 MG capsule, Take 12.5 mg by mouth daily., Disp: , Rfl:  .  mirabegron ER (MYRBETRIQ) 25 MG TB24 tablet, Take 1 tablet (25 mg total) by mouth daily., Disp: 90 tablet, Rfl: 11 .  Multiple Vitamin (MULTIVITAMIN WITH MINERALS) TABS tablet, Take 1 tablet by mouth daily., Disp: , Rfl:  .  thiamine 100 MG tablet, Take 1 tablet (100 mg total) by mouth daily., Disp: , Rfl:  .  tiotropium (SPIRIVA HANDIHALER) 18 MCG inhalation capsule, PLACE 1 CAPSULE (18 MCG TOTAL) INTO INHALER AND INHALE DAILY, Disp: , Rfl:    Allergies:  Fluocinolone  Review of Systems:  Constitutional: Feels well. Cardiovascular: No chest pain.  Pulmonary: Denies dyspnea.   The remainder of systems were reviewed and were found to be negative other than what is documented in the HPI.    Physical Examination:   VS: BP 130/70 (BP Location: Left Arm, Cuff Size: Normal)   Pulse (!) 55   Ht 5\' 6"  (1.676 m)   Wt 178 lb (80.7 kg)   SpO2 90%   BMI 28.73 kg/m   General Appearance: No distress  Neuro:without focal findings, mental status, speech normal, alert and oriented HEENT: PERRLA, EOM intact Pulmonary: No wheezing, No rales  CardiovascularNormal S1,S2.  No m/r/g.  Abdomen: Benign, Soft, non-tender, No masses Renal:  No costovertebral tenderness  GU:  No performed at this time. Endoc: No evident thyromegaly, no signs of  acromegaly or Cushing features Skin:   warm, no rashes, no ecchymosis  Extremities: normal, no cyanosis, clubbing.     Other findings:    LABORATORY PANEL:   CBC No results for input(s): WBC, HGB, HCT, PLT in the last 168 hours. ------------------------------------------------------------------------------------------------------------------  Chemistries  No results for input(s): NA, K, CL, CO2, GLUCOSE, BUN, CREATININE, CALCIUM, MG, AST, ALT, ALKPHOS, BILITOT in the last 168 hours.  Invalid input(s): GFRCGP ------------------------------------------------------------------------------------------------------------------  Cardiac Enzymes No results for input(s): TROPONINI in the last 168 hours. ------------------------------------------------------------  RADIOLOGY:  No results found.     Thank  you for the consultation and for allowing [facility] to assist in the care  of your patient. Our recommendations are noted above.  Please contact us if we can be of further service.  Marda Stalker, M.D., F.C.C.P.  Board Certified in Internal Medicine, Pulmonary Medicine, [place], and Sleep Medicine.  [facility] Office Number: [phone]   08/03/2017

## 2017-08-02 ENCOUNTER — Encounter: Payer: Self-pay | Admitting: Internal Medicine

## 2017-08-03 ENCOUNTER — Encounter: Payer: Self-pay | Admitting: Internal Medicine

## 2017-08-03 ENCOUNTER — Ambulatory Visit: Payer: PPO | Admitting: Internal Medicine

## 2017-08-03 VITALS — BP 130/70 | HR 55 | Ht 66.0 in | Wt 178.0 lb

## 2017-08-03 DIAGNOSIS — J9611 Chronic respiratory failure with hypoxia: Secondary | ICD-10-CM

## 2017-08-03 DIAGNOSIS — J9612 Chronic respiratory failure with hypercapnia: Secondary | ICD-10-CM

## 2017-08-03 DIAGNOSIS — G4733 Obstructive sleep apnea (adult) (pediatric): Secondary | ICD-10-CM

## 2017-08-03 NOTE — Patient Instructions (Addendum)
Continue to try to use the PAP every night for the whole night.

## 2017-08-17 DIAGNOSIS — R0902 Hypoxemia: Secondary | ICD-10-CM | POA: Diagnosis not present

## 2017-08-17 DIAGNOSIS — J45998 Other asthma: Secondary | ICD-10-CM | POA: Diagnosis not present

## 2017-08-17 DIAGNOSIS — R5381 Other malaise: Secondary | ICD-10-CM | POA: Diagnosis not present

## 2017-08-17 DIAGNOSIS — J449 Chronic obstructive pulmonary disease, unspecified: Secondary | ICD-10-CM | POA: Diagnosis not present

## 2017-08-17 DIAGNOSIS — R0602 Shortness of breath: Secondary | ICD-10-CM | POA: Diagnosis not present

## 2017-08-17 DIAGNOSIS — G4733 Obstructive sleep apnea (adult) (pediatric): Secondary | ICD-10-CM | POA: Diagnosis not present

## 2017-08-17 DIAGNOSIS — J438 Other emphysema: Secondary | ICD-10-CM | POA: Diagnosis not present

## 2017-09-16 DIAGNOSIS — L218 Other seborrheic dermatitis: Secondary | ICD-10-CM | POA: Diagnosis not present

## 2017-09-17 DIAGNOSIS — R0902 Hypoxemia: Secondary | ICD-10-CM | POA: Diagnosis not present

## 2017-09-17 DIAGNOSIS — J45998 Other asthma: Secondary | ICD-10-CM | POA: Diagnosis not present

## 2017-09-17 DIAGNOSIS — R0602 Shortness of breath: Secondary | ICD-10-CM | POA: Diagnosis not present

## 2017-09-17 DIAGNOSIS — R5381 Other malaise: Secondary | ICD-10-CM | POA: Diagnosis not present

## 2017-09-17 DIAGNOSIS — G4733 Obstructive sleep apnea (adult) (pediatric): Secondary | ICD-10-CM | POA: Diagnosis not present

## 2017-09-17 DIAGNOSIS — J449 Chronic obstructive pulmonary disease, unspecified: Secondary | ICD-10-CM | POA: Diagnosis not present

## 2017-09-17 DIAGNOSIS — J438 Other emphysema: Secondary | ICD-10-CM | POA: Diagnosis not present

## 2017-10-13 DIAGNOSIS — J438 Other emphysema: Secondary | ICD-10-CM | POA: Diagnosis not present

## 2017-10-13 DIAGNOSIS — J8410 Pulmonary fibrosis, unspecified: Secondary | ICD-10-CM | POA: Diagnosis not present

## 2017-10-13 DIAGNOSIS — I280 Arteriovenous fistula of pulmonary vessels: Secondary | ICD-10-CM | POA: Diagnosis not present

## 2017-10-13 DIAGNOSIS — J9611 Chronic respiratory failure with hypoxia: Secondary | ICD-10-CM | POA: Diagnosis not present

## 2017-10-13 DIAGNOSIS — F3342 Major depressive disorder, recurrent, in full remission: Secondary | ICD-10-CM | POA: Diagnosis not present

## 2017-10-13 DIAGNOSIS — I739 Peripheral vascular disease, unspecified: Secondary | ICD-10-CM | POA: Diagnosis not present

## 2017-10-13 DIAGNOSIS — I700 Atherosclerosis of aorta: Secondary | ICD-10-CM | POA: Diagnosis not present

## 2017-10-13 DIAGNOSIS — G4733 Obstructive sleep apnea (adult) (pediatric): Secondary | ICD-10-CM | POA: Diagnosis not present

## 2017-10-13 DIAGNOSIS — I2510 Atherosclerotic heart disease of native coronary artery without angina pectoris: Secondary | ICD-10-CM | POA: Diagnosis not present

## 2017-10-13 DIAGNOSIS — D509 Iron deficiency anemia, unspecified: Secondary | ICD-10-CM | POA: Diagnosis not present

## 2017-10-13 DIAGNOSIS — E782 Mixed hyperlipidemia: Secondary | ICD-10-CM | POA: Diagnosis not present

## 2017-10-18 DIAGNOSIS — J438 Other emphysema: Secondary | ICD-10-CM | POA: Diagnosis not present

## 2017-10-18 DIAGNOSIS — R0902 Hypoxemia: Secondary | ICD-10-CM | POA: Diagnosis not present

## 2017-10-18 DIAGNOSIS — G4733 Obstructive sleep apnea (adult) (pediatric): Secondary | ICD-10-CM | POA: Diagnosis not present

## 2017-10-18 DIAGNOSIS — J45998 Other asthma: Secondary | ICD-10-CM | POA: Diagnosis not present

## 2017-10-18 DIAGNOSIS — J449 Chronic obstructive pulmonary disease, unspecified: Secondary | ICD-10-CM | POA: Diagnosis not present

## 2017-10-18 DIAGNOSIS — R0602 Shortness of breath: Secondary | ICD-10-CM | POA: Diagnosis not present

## 2017-10-18 DIAGNOSIS — R5381 Other malaise: Secondary | ICD-10-CM | POA: Diagnosis not present

## 2017-10-19 ENCOUNTER — Ambulatory Visit: Payer: PPO | Admitting: Urology

## 2017-10-19 ENCOUNTER — Encounter: Payer: Self-pay | Admitting: Urology

## 2017-10-26 DIAGNOSIS — R0902 Hypoxemia: Secondary | ICD-10-CM | POA: Diagnosis not present

## 2017-10-26 DIAGNOSIS — J449 Chronic obstructive pulmonary disease, unspecified: Secondary | ICD-10-CM | POA: Diagnosis not present

## 2017-10-26 DIAGNOSIS — G4733 Obstructive sleep apnea (adult) (pediatric): Secondary | ICD-10-CM | POA: Diagnosis not present

## 2017-10-26 DIAGNOSIS — J438 Other emphysema: Secondary | ICD-10-CM | POA: Diagnosis not present

## 2017-10-26 DIAGNOSIS — R0602 Shortness of breath: Secondary | ICD-10-CM | POA: Diagnosis not present

## 2017-10-26 DIAGNOSIS — R5381 Other malaise: Secondary | ICD-10-CM | POA: Diagnosis not present

## 2017-10-26 DIAGNOSIS — J45998 Other asthma: Secondary | ICD-10-CM | POA: Diagnosis not present

## 2017-10-27 ENCOUNTER — Ambulatory Visit
Admission: RE | Admit: 2017-10-27 | Discharge: 2017-10-27 | Disposition: A | Discharge status: Home / Self Care | Payer: PPO | Source: Ambulatory Visit | Attending: Internal Medicine | Admitting: Internal Medicine

## 2017-10-27 ENCOUNTER — Ambulatory Visit: Payer: PPO | Admitting: Internal Medicine

## 2017-10-27 ENCOUNTER — Encounter: Payer: Self-pay | Admitting: Internal Medicine

## 2017-10-27 VITALS — BP 122/70 | HR 74 | Resp 16 | Ht 65.5 in | Wt 174.0 lb

## 2017-10-27 DIAGNOSIS — R0609 Other forms of dyspnea: Secondary | ICD-10-CM | POA: Diagnosis not present

## 2017-10-27 DIAGNOSIS — J984 Other disorders of lung: Secondary | ICD-10-CM | POA: Insufficient documentation

## 2017-10-27 DIAGNOSIS — J439 Emphysema, unspecified: Secondary | ICD-10-CM | POA: Diagnosis not present

## 2017-10-27 DIAGNOSIS — J449 Chronic obstructive pulmonary disease, unspecified: Secondary | ICD-10-CM | POA: Insufficient documentation

## 2017-10-27 NOTE — Progress Notes (Signed)
[name]    Assessment and Plan:  The patient is a 77 year old male with severe emphysema, obstructive sleep apnea, evidence of CO2 retention.  He is on 3 L of oxygen chronically.  COPD/emphysema with dyspnea on exertion episode of acute bronchitis. -Severe COPD emphysema with CO2 retention/hypercapnia. -Recent episode of acute bronchitis versus pneumonia.  He was treated at his primary care physician's office with antibiotics and steroids.  Chest x-ray at that time was suggestive of pneumonia versus interstitial lung disease. -We will repeat chest x-ray to see if changes have improved.  Debility/deconditioning. - Confirmed with patient and daughter is functional status appears to have progressively declined over the last few years. - We will focus on continued maintenance of his current functional status with a goal of maintaining a quality of life.   Obstructive sleep apnea with hypercapnia. -Discussed importance using BiPAP every night, particularly in regards to his elevated CO2 levels. - Patient is Chris been to mask fitting clinic.  Advance care planning discussion 03/28/2017.Marland Kitchen Discussed patient's severe baseline respiratory status, he is at high risk of decompensation and hospital admission due to his severe emphysema and hypoxic respiratory failure with chronic hypercapnia.  We discussed that if his heart were to stop or if he would require mechanical ventilation, it would be difficult to wean him off the ventilator. He states that if he were to die he would not want to be brought back, he would not want to be placed on a ventilator or undergo CPR.  He states this in the presence of his family, and the he notes that he is spoken with them about this before, he has a living will in place.  We discussed that his CODE STATUS that would be DNR, and he is in agreement with this.  Orders Placed This Encounter  Procedures  . DG Chest 2 View     Return in about  3 months (around 01/26/2018).   Date: 10/27/2017  MRN# [id number] Chris Lozano [birth year]   Chris Lozano is a 77 y.o. old male seen in consultation for chief complaint of:    Chief Complaint  Patient presents with  . Sleep Apnea    per daughter pt is a mouth breather and has time with bipap.    HPI:  The patient is a 77 year old male with COPD, hypoventilation on BiPAP, history of pulmonary AVM.Marland Kitchen  On review of download data continues to have poor compliance with BiPAP. He saw [name] about 2 weeks ago, he was having dyspnea and worsening hypoxia on oxygen.  He was changed from pulse dose to continuous flow. He was given prednisone, abx. He was sent for a CXR.  I was unable to view the actual images, however the report was available and read as bilateral interstitial lung disease. He is using symbicort 2 puffs bid, and spiriva once daily. He feels his breathing is better than it was 2 weeks ago.  He is followed by [facility] pulmonary in [facility], and has periodic CT scans.  He was started on BiPAP due to hypoventilation, at his last visit his compliance was poor with residual elevated AHI. Today he feels that he continues to do well with Bipap and that it helps him however he continues to have high residual AHI with poor compliance.    He seems to take it off in the middle of the night and does not remember. He leaves it on the whole night and he does not know why  some nights he can wear it the whole night and not on others.   **CPAP download data 09/27/2017-10/26/2017>> raw data personally reviewed, usage greater than 4 hours is 10/30 days.  Average usage on days used was 3 hours 32 minutes.  Pressure setting maximum IPAP 16, minimum EPAP 10, pressure support 5.  Residual AHI is elevated at 28.1. **Download data 07/04/2017 to 08/02/2017>> personally reviewed usage greater than 4 hours of 6/30 days.  Average usage on days used is 2 hours 40 minutes.  V auto setting max IPAP 16 minimum EPAP 10  pressure support 5, residual AHI 17.6, predominantly obstructive.  This shows very poor compliance with CPAP, with inadequate control of obstructive sleep apnea **chest x-ray 11/28/16; hyperinflation consistent with emphysema, changes of chronic bronchitis **Desat walk on 12/16/16; at rest on 3L sat was 92% and HR 72. After walking 180 feet sat was 86% and HR 88.  **Download for the past 30 days, as of 03/27/17, usage greater than 4 hours is 17/30 days.  Average use is on days used is 4 3 9  minutes.  Setting is BiPAP auto, maximum IPAP of 16, minimum EPAP is 10, pressure support 5 residual AHI is 12.4.  However on days where use of the BiPAP for the entire night his AHI dropped significantly.   Results for Chris Lozano (MRN [id number]) as of 12/14/2016 16:05  Ref. Range 01/18/2012 04:21 09/28/2016 14:52 11/28/2016 21:48 11/29/2016 00:24 11/29/2016 04:16  CO2 Latest Ref Range: 22 - 32 mmol/L 35 (H)  35 (H)  34 (H)   Date: FVC (% Pred) FEV1 (% Pred) Pre-BD FEV1 (%Pred) Post- BD FEV1/FVC FEF25-75(% Pred) DLCO (% Pred)  December 12, 2013 1.83 L (50%) 0.93 L (35%) 1.32 (50%) 51 0.29 (15%) 45%   Social Hx:   Social History   Tobacco Use  . Smoking status: Former Research scientist (life sciences)  . Smokeless tobacco: Former Systems developer    Types: Chew  . Tobacco comment: as a teenager  Substance Use Topics  . Alcohol use: Yes    Alcohol/week: 0.0 standard drinks    Comment: occasionally  . Drug use: No   Medication:    Current Outpatient Medications:  .  albuterol (PROVENTIL HFA;VENTOLIN HFA) 108 (90 Base) MCG/ACT inhaler, Inhale 2 puffs into the lungs every 6 (six) hours as needed for wheezing or shortness of breath. , Disp: , Rfl:  .  aspirin EC 81 MG tablet, Take 81 mg by mouth daily., Disp: , Rfl:  .  atorvastatin (LIPITOR) 80 MG tablet, Take 80 mg by mouth daily., Disp: , Rfl:  .  budesonide-formoterol (SYMBICORT) 160-4.5 MCG/ACT inhaler, Inhale 2 puffs 2 (two) times daily into the lungs., Disp: 1 Inhaler, Rfl: 10 .   hydrochlorothiazide (MICROZIDE) 12.5 MG capsule, Take 12.5 mg by mouth daily., Disp: , Rfl:  .  mirabegron ER (MYRBETRIQ) 25 MG TB24 tablet, Take 1 tablet (25 mg total) by mouth daily., Disp: 90 tablet, Rfl: 11 .  Multiple Vitamin (MULTIVITAMIN WITH MINERALS) TABS tablet, Take 1 tablet by mouth daily., Disp: , Rfl:  .  thiamine 100 MG tablet, Take 1 tablet (100 mg total) by mouth daily., Disp: , Rfl:  .  tiotropium (SPIRIVA HANDIHALER) 18 MCG inhalation capsule, PLACE 1 CAPSULE (18 MCG TOTAL) INTO INHALER AND INHALE DAILY, Disp: , Rfl:    Allergies:  Fluocinolone  Review of Systems  Constitutional: Negative for chills and fever.  HENT: Negative for hearing loss and tinnitus.   Eyes: Negative for blurred vision and double  vision.  Respiratory: Positive for cough and sputum production.   Cardiovascular: Negative for chest pain and PND.  Gastrointestinal: Negative for heartburn and nausea.  Genitourinary: Negative for dysuria and urgency.  Musculoskeletal: Positive for myalgias. Negative for neck pain.  Skin: Negative for itching and rash.  Neurological: Negative for seizures and loss of consciousness.  Psychiatric/Behavioral: The patient is not nervous/anxious and does not have insomnia.    Physical Examination:   VS: BP 122/70 (BP Location: Left Arm, Cuff Size: Normal)   Pulse 74   Resp 16   Ht 5' 5.5" (1.664 m)   Wt 174 lb (78.9 kg)   SpO2 93%   BMI 28.51 kg/m   General Appearance: No distress  Neuro:without focal findings, mental status, speech normal, alert and oriented HEENT: PERRLA, EOM intact Pulmonary: No wheezing, No rales  CardiovascularNormal S1,S2.  No m/r/g.  Abdomen: Benign, Soft, non-tender, No masses Renal:  No costovertebral tenderness  GU:  No performed at this time. Endoc: No evident thyromegaly, no signs of acromegaly or Cushing features Skin:   warm, no rashes, no ecchymosis  Extremities: normal, no cyanosis, clubbing.       Other findings:     LABORATORY PANEL:   CBC No results for input(s): WBC, HGB, HCT, PLT in the last 168 hours. ------------------------------------------------------------------------------------------------------------------  Chemistries  No results for input(s): NA, K, CL, CO2, GLUCOSE, BUN, CREATININE, CALCIUM, MG, AST, ALT, ALKPHOS, BILITOT in the last 168 hours.  Invalid input(s): GFRCGP ------------------------------------------------------------------------------------------------------------------  Cardiac Enzymes No results for input(s): TROPONINI in the last 168 hours. ------------------------------------------------------------  RADIOLOGY:  No results found.     Thank  you for the consultation and for allowing [facility] to assist in the care of your patient. Our recommendations are noted above.  Please contact us if we can be of further service.  Marda Stalker, M.D., F.C.C.P.  Board Certified in Internal Lozano, Pulmonary Lozano, [place], and Sleep Lozano.  [facility] Office Number: [phone]   10/27/2017

## 2017-10-27 NOTE — Patient Instructions (Addendum)
Will send for repeat Chest Xray.  Continue current inhalers.

## 2017-10-28 ENCOUNTER — Telehealth: Payer: Self-pay | Admitting: Internal Medicine

## 2017-10-28 NOTE — Telephone Encounter (Signed)
Daughter called and states pt still sleeps a lot, states pt has not been using his Bipap machine. Sthe states his progress is getting worse, asks if his inhalers should be increased. Please call to discuss.

## 2017-10-31 NOTE — Telephone Encounter (Signed)
He has several medical problems which are causing his health to decline. A change in inhaler would not be helpful.

## 2017-10-31 NOTE — Telephone Encounter (Signed)
Spoke to daughter regarding inhalers, per Dr. Mathis Fare reponse, increasing these would not be helpful. Advised that patient should continue to use biPap machine. Daughter understands, with no further questions at this time.

## 2017-11-10 DIAGNOSIS — E782 Mixed hyperlipidemia: Secondary | ICD-10-CM | POA: Diagnosis not present

## 2017-11-10 DIAGNOSIS — D509 Iron deficiency anemia, unspecified: Secondary | ICD-10-CM | POA: Diagnosis not present

## 2017-11-10 DIAGNOSIS — I739 Peripheral vascular disease, unspecified: Secondary | ICD-10-CM | POA: Diagnosis not present

## 2017-11-10 DIAGNOSIS — I2510 Atherosclerotic heart disease of native coronary artery without angina pectoris: Secondary | ICD-10-CM | POA: Diagnosis not present

## 2017-11-10 DIAGNOSIS — I280 Arteriovenous fistula of pulmonary vessels: Secondary | ICD-10-CM | POA: Diagnosis not present

## 2017-11-15 DIAGNOSIS — G4733 Obstructive sleep apnea (adult) (pediatric): Secondary | ICD-10-CM | POA: Diagnosis not present

## 2017-11-15 DIAGNOSIS — R5381 Other malaise: Secondary | ICD-10-CM | POA: Diagnosis not present

## 2017-11-15 DIAGNOSIS — J449 Chronic obstructive pulmonary disease, unspecified: Secondary | ICD-10-CM | POA: Diagnosis not present

## 2017-11-15 DIAGNOSIS — J438 Other emphysema: Secondary | ICD-10-CM | POA: Diagnosis not present

## 2017-11-15 DIAGNOSIS — J45998 Other asthma: Secondary | ICD-10-CM | POA: Diagnosis not present

## 2017-11-15 DIAGNOSIS — R0602 Shortness of breath: Secondary | ICD-10-CM | POA: Diagnosis not present

## 2017-11-16 DIAGNOSIS — Z7951 Long term (current) use of inhaled steroids: Secondary | ICD-10-CM | POA: Diagnosis not present

## 2017-11-16 DIAGNOSIS — Z9049 Acquired absence of other specified parts of digestive tract: Secondary | ICD-10-CM | POA: Diagnosis not present

## 2017-11-16 DIAGNOSIS — Z79899 Other long term (current) drug therapy: Secondary | ICD-10-CM | POA: Diagnosis not present

## 2017-11-16 DIAGNOSIS — G4733 Obstructive sleep apnea (adult) (pediatric): Secondary | ICD-10-CM | POA: Diagnosis not present

## 2017-11-16 DIAGNOSIS — Z23 Encounter for immunization: Secondary | ICD-10-CM | POA: Diagnosis not present

## 2017-11-16 DIAGNOSIS — Z87891 Personal history of nicotine dependence: Secondary | ICD-10-CM | POA: Diagnosis not present

## 2017-11-16 DIAGNOSIS — J449 Chronic obstructive pulmonary disease, unspecified: Secondary | ICD-10-CM | POA: Diagnosis not present

## 2017-11-16 DIAGNOSIS — Z7982 Long term (current) use of aspirin: Secondary | ICD-10-CM | POA: Diagnosis not present

## 2017-11-16 DIAGNOSIS — I2510 Atherosclerotic heart disease of native coronary artery without angina pectoris: Secondary | ICD-10-CM | POA: Diagnosis not present

## 2017-11-17 DIAGNOSIS — R0602 Shortness of breath: Secondary | ICD-10-CM | POA: Diagnosis not present

## 2017-11-17 DIAGNOSIS — R0902 Hypoxemia: Secondary | ICD-10-CM | POA: Diagnosis not present

## 2017-11-17 DIAGNOSIS — J438 Other emphysema: Secondary | ICD-10-CM | POA: Diagnosis not present

## 2017-11-17 DIAGNOSIS — J449 Chronic obstructive pulmonary disease, unspecified: Secondary | ICD-10-CM | POA: Diagnosis not present

## 2017-11-17 DIAGNOSIS — J45998 Other asthma: Secondary | ICD-10-CM | POA: Diagnosis not present

## 2017-11-17 DIAGNOSIS — R5381 Other malaise: Secondary | ICD-10-CM | POA: Diagnosis not present

## 2017-11-17 DIAGNOSIS — G4733 Obstructive sleep apnea (adult) (pediatric): Secondary | ICD-10-CM | POA: Diagnosis not present

## 2017-11-18 DIAGNOSIS — J449 Chronic obstructive pulmonary disease, unspecified: Secondary | ICD-10-CM | POA: Diagnosis not present

## 2017-11-25 DIAGNOSIS — J45998 Other asthma: Secondary | ICD-10-CM | POA: Diagnosis not present

## 2017-11-25 DIAGNOSIS — R0902 Hypoxemia: Secondary | ICD-10-CM | POA: Diagnosis not present

## 2017-11-25 DIAGNOSIS — R5381 Other malaise: Secondary | ICD-10-CM | POA: Diagnosis not present

## 2017-11-25 DIAGNOSIS — J449 Chronic obstructive pulmonary disease, unspecified: Secondary | ICD-10-CM | POA: Diagnosis not present

## 2017-11-25 DIAGNOSIS — G4733 Obstructive sleep apnea (adult) (pediatric): Secondary | ICD-10-CM | POA: Diagnosis not present

## 2017-11-25 DIAGNOSIS — R0602 Shortness of breath: Secondary | ICD-10-CM | POA: Diagnosis not present

## 2017-11-25 DIAGNOSIS — J438 Other emphysema: Secondary | ICD-10-CM | POA: Diagnosis not present

## 2017-11-29 ENCOUNTER — Encounter: Payer: PPO | Attending: Pulmonary Disease

## 2017-12-07 DIAGNOSIS — R5381 Other malaise: Secondary | ICD-10-CM | POA: Diagnosis not present

## 2017-12-07 DIAGNOSIS — G4733 Obstructive sleep apnea (adult) (pediatric): Secondary | ICD-10-CM | POA: Diagnosis not present

## 2017-12-07 DIAGNOSIS — R0602 Shortness of breath: Secondary | ICD-10-CM | POA: Diagnosis not present

## 2017-12-07 DIAGNOSIS — J438 Other emphysema: Secondary | ICD-10-CM | POA: Diagnosis not present

## 2017-12-07 DIAGNOSIS — J45998 Other asthma: Secondary | ICD-10-CM | POA: Diagnosis not present

## 2017-12-07 DIAGNOSIS — J449 Chronic obstructive pulmonary disease, unspecified: Secondary | ICD-10-CM | POA: Diagnosis not present

## 2017-12-07 DIAGNOSIS — R0902 Hypoxemia: Secondary | ICD-10-CM | POA: Diagnosis not present

## 2017-12-17 DIAGNOSIS — J438 Other emphysema: Secondary | ICD-10-CM | POA: Diagnosis not present

## 2017-12-17 DIAGNOSIS — J45998 Other asthma: Secondary | ICD-10-CM | POA: Diagnosis not present

## 2017-12-17 DIAGNOSIS — R0902 Hypoxemia: Secondary | ICD-10-CM | POA: Diagnosis not present

## 2017-12-17 DIAGNOSIS — J449 Chronic obstructive pulmonary disease, unspecified: Secondary | ICD-10-CM | POA: Diagnosis not present

## 2017-12-17 DIAGNOSIS — G4733 Obstructive sleep apnea (adult) (pediatric): Secondary | ICD-10-CM | POA: Diagnosis not present

## 2017-12-17 DIAGNOSIS — R0602 Shortness of breath: Secondary | ICD-10-CM | POA: Diagnosis not present

## 2017-12-17 DIAGNOSIS — R5381 Other malaise: Secondary | ICD-10-CM | POA: Diagnosis not present

## 2017-12-18 DIAGNOSIS — J45998 Other asthma: Secondary | ICD-10-CM | POA: Diagnosis not present

## 2017-12-18 DIAGNOSIS — J449 Chronic obstructive pulmonary disease, unspecified: Secondary | ICD-10-CM | POA: Diagnosis not present

## 2017-12-18 DIAGNOSIS — J438 Other emphysema: Secondary | ICD-10-CM | POA: Diagnosis not present

## 2017-12-18 DIAGNOSIS — R0602 Shortness of breath: Secondary | ICD-10-CM | POA: Diagnosis not present

## 2017-12-18 DIAGNOSIS — R0902 Hypoxemia: Secondary | ICD-10-CM | POA: Diagnosis not present

## 2017-12-18 DIAGNOSIS — G4733 Obstructive sleep apnea (adult) (pediatric): Secondary | ICD-10-CM | POA: Diagnosis not present

## 2017-12-18 DIAGNOSIS — R5381 Other malaise: Secondary | ICD-10-CM | POA: Diagnosis not present

## 2017-12-19 DIAGNOSIS — J449 Chronic obstructive pulmonary disease, unspecified: Secondary | ICD-10-CM | POA: Diagnosis not present

## 2017-12-21 DIAGNOSIS — I2510 Atherosclerotic heart disease of native coronary artery without angina pectoris: Secondary | ICD-10-CM | POA: Diagnosis not present

## 2017-12-21 DIAGNOSIS — J9611 Chronic respiratory failure with hypoxia: Secondary | ICD-10-CM | POA: Diagnosis not present

## 2017-12-21 DIAGNOSIS — D509 Iron deficiency anemia, unspecified: Secondary | ICD-10-CM | POA: Diagnosis not present

## 2017-12-21 DIAGNOSIS — G4733 Obstructive sleep apnea (adult) (pediatric): Secondary | ICD-10-CM | POA: Diagnosis not present

## 2017-12-21 DIAGNOSIS — J8410 Pulmonary fibrosis, unspecified: Secondary | ICD-10-CM | POA: Diagnosis not present

## 2017-12-21 DIAGNOSIS — F3342 Major depressive disorder, recurrent, in full remission: Secondary | ICD-10-CM | POA: Diagnosis not present

## 2017-12-21 DIAGNOSIS — E782 Mixed hyperlipidemia: Secondary | ICD-10-CM | POA: Diagnosis not present

## 2017-12-21 DIAGNOSIS — J438 Other emphysema: Secondary | ICD-10-CM | POA: Diagnosis not present

## 2017-12-21 DIAGNOSIS — I280 Arteriovenous fistula of pulmonary vessels: Secondary | ICD-10-CM | POA: Diagnosis not present

## 2017-12-21 DIAGNOSIS — I739 Peripheral vascular disease, unspecified: Secondary | ICD-10-CM | POA: Diagnosis not present

## 2017-12-21 DIAGNOSIS — I700 Atherosclerosis of aorta: Secondary | ICD-10-CM | POA: Diagnosis not present

## 2018-01-02 ENCOUNTER — Other Ambulatory Visit: Payer: Self-pay | Admitting: *Deleted

## 2018-01-02 DIAGNOSIS — J441 Chronic obstructive pulmonary disease with (acute) exacerbation: Secondary | ICD-10-CM

## 2018-01-02 NOTE — Patient Outreach (Signed)
[name]) Care Management  01/02/2018  Chris Lozano 23-Jul-1940 [id number]   Please see other note of today by this RNCM. Opened chart documentation in incorrect Epic domain/context.   Barrington Ellison RN,CCM,CDE [place] Management Coordinator Office Phone [phone] Office Fax [phone]

## 2018-01-02 NOTE — Patient Outreach (Signed)
[facility]) Care Management  01/02/2018  GIACOMO VALONE 1940-07-29 [id number]   TELEPHONE SCREENING Referral date:12/27/17 Referral source:[employer] Team Advantage Concierge Referral reason: Medication Assistance - patient in coverage gap Insurance:Health Team Advantage Plan I  Successful initial outreach to patient regarding financial assistance with cost of Perforomist as he is in the coverage gap.   Patient states he has COPD and is O2 dependant at 2 liters at all times.  He gave verbal permission to speak with both his daughter Dewaine Conger and his wife Benjamine Mola regarding his protected health  information.  After discussion with patient, referral placed to [place] Management pharmacist for financial assist with Perforomist nebulized medication. He refused offer of [employer] [employer] Officer for ongoing COPD self management assistance.  Barrington Ellison RN,CCM,CDE [place] Management Coordinator Office Phone [phone] Office Fax [phone]

## 2018-01-03 ENCOUNTER — Other Ambulatory Visit: Payer: Self-pay

## 2018-01-03 NOTE — Patient Outreach (Signed)
[facility]) Care Management  [place]   01/03/2018  Chris Lozano [birth year] [id number]  Reason for referral: medication assistance  Referral source: [facility] RN CM Referral medication(s): Perforomist Current insurance:HTA  PMHx:  Coronary artery disease, COPD, GERD and hyperlipidemia  HPI: Mr. Chris Lozano states he his having trouble affording his Perforomist.  He is very confused about what he is taking and several inhalers in his possession that were discontinued by his pulmonologist on 11/15/17.    Objective: Allergies  Allergen Reactions  . Fluocinolone Other (See Comments)    Reaction: dizzines, "drunk" Other reaction(s): Unknown  . Ciprofloxacin Hcl     Dizzy    Medications Reviewed Today    Reviewed by Chris Lozano, [facility] (Pharmacist) on 01/03/18 at Chris Lozano List Status: <None>  Medication Order Taking? Sig Documenting Provider Last Dose Status Informant  albuterol (PROVENTIL HFA;VENTOLIN HFA) 108 (90 Base) MCG/ACT inhaler [id number] Yes Inhale 2 puffs into the lungs every 6 (six) hours as needed for wheezing or shortness of breath.  [provider] Taking Active Child  albuterol (PROVENTIL) (2.5 MG/3ML) 0.083% nebulizer solution [id number] Yes Take 2.5 mg by nebulization every 6 (six) hours as needed for wheezing or shortness of breath. [provider] Taking Active   aspirin EC 81 MG tablet [id number] Yes Take 81 mg by mouth daily. [provider] Taking Active Child  atorvastatin (LIPITOR) 80 MG tablet [id number] Yes Take 80 mg by mouth daily. [provider] Taking Active Child  b complex vitamins tablet [id number] Yes Take 1 tablet by mouth daily. [provider] Taking Active Self  budesonide (PULMICORT) 0.5 MG/2ML nebulizer solution [id number] Yes Take 0.5 mg by nebulization 2 (two) times daily. [provider] Taking Active Self  Co-Enzyme Q10 200 MG CAPS [id number] Yes Take 1 capsule by  mouth daily. [provider] Taking Active Self  Cyanocobalamin 5000 MCG SUBL [id number] Yes Place 5,000 mcg under the tongue daily. [provider] Taking Active Self  fesoterodine (TOVIAZ) 8 MG TB24 tablet [id number] No Take 8 mg by mouth daily. [provider] Unknown Active   formoterol (PERFOROMIST) 20 MCG/2ML nebulizer solution [id number] Yes Inhale 20 mcg into the lungs 2 (two) times daily.  [provider] Taking Active   gabapentin (NEURONTIN) 100 MG capsule [id number] Yes Take 100 mg by mouth daily.  [provider] Taking Active   hydrochlorothiazide (MICROZIDE) 12.5 MG capsule [id number] No Take 12.5 mg by mouth daily. [provider] Unknown Active Child  KLOR-CON M10 10 MEQ tablet [id number] Yes TAKE 1 TABLET (10 MEQ TOTAL) BY MOUTH 2 (TWO) TIMES DAILY [provider] Taking Active   mirabegron ER (MYRBETRIQ) 25 MG TB24 tablet [id number] No Take 1 tablet (25 mg total) by mouth daily. Chris Lozano A, PA-C Unknown Active Child  Multiple Vitamin (MULTIVITAMIN WITH MINERALS) TABS tablet [id number] Yes Take 1 tablet by mouth daily. Chris Mango, MD Taking Active   Revefenacin 175 MCG/3ML SOLN [id number] No Inhale 175 mcg into the lungs daily. Chris Lozano [provider] Not Taking Active Self         Assessment:  Drugs sorted by system:   *Medications getting clarified with PCP  Neurologic/Psychologic: gabapentin   Cardiovascular: aspirin, atorvastatin, hydrochlorothiazide*, potassium chloride,   Pulmonary/Allergy: albuterol neb and MDI, budesonide neb, formoterol neb, revefenacin neb.  Genitourinary: mirabegron ER*, fesoterodine*  Vitamins/Minerals/Supplements: B vitamin complex, co-enzyme Q10, cyanocobalamin, MVI  Medication Review Findings:  . Hydrochlorothiazide- patient states he is not taking,  but he confirms he is taking KCl.  Clarifying with PCP.  Marland Kitchen Mirabegron ER on PCP list of medications. Patient states  he is taking fesoterodine and not mirabegron ER.  Clarifying with PCP. Marland Kitchen Revefenacin Chris Lozano)- patient states he does not use this nebulization solution.  Verified with Chris Lozano's office (pulmonology) that this medication was prescribed 11/15/17. Marland Kitchen Spiriva and Symbicort- medications still in patient's home and on EMR med list.  Verified with Chris Lozano at Chris Lozano office these inhalers were discontinued on 11/15/17.  He was started on formoterol (Perforomist) and budesonide (Pulmicort) nebs., along with his albuterol nebulization solution.     Medication Assistance Findings:  Extra Help:   []  Already receiving Full Extra Help  []  Already receiving Partial Extra Help  []  Eligible based on reported income and assets  [x]  Not Eligible based on reported income and assets  Patient Assistance Programs: Perforomist and Revefenacin made by [employer]- No patient assistance programs available.  [place] allows Perforomist applications for asthma, but not COPD.  [place] doesn't have open programs.  Both medications have co-pay cards that are not valid for Medicare.         Plan: Follow up with PCP,  Dr. Caryl Lozano to clarify current  Medications.   Chris Lozano, PharmD Clinical Pharmacist [place] [phone]

## 2018-01-04 ENCOUNTER — Other Ambulatory Visit: Payer: Self-pay

## 2018-01-04 NOTE — Patient Outreach (Signed)
[facility]) Care Management  01/04/2018  Chris Lozano October 10, 1940 [id number]  Incoming call received from [facility] at Dr. Olin Pia office.  She has outreached to Chris Lozano and Chris Lozano recommends the following.   Chris Lozano states that the patient has an appointment scheduled in March and declines coming in sooner for an appointment.  She states she reviewed his medication changes with him and patient verbalized understanding.   1.  D/C HCTZ-patient has not been taking. 2.  D/C KCL-patient has been taking. 3.  D/C mirabegron ER-patient states he is taking Toviaz.  Nurse states that his pharmacy reports it was last filled 2 years ago.  Patient insists he is taking, but cannot tell who prescribed it.   4.  Atorvastatin -refill called into his pharmacy today. 5.  Formoterol neb-picked up from pharmacy 6.  Revefenacin- on file at pharmacy, but not picked up.  Patient said he would get it if he could afford it.     Updated medication list. Medications Reviewed Today    Reviewed by Chris Lozano, [facility] (Pharmacist) on 01/04/18 at 1405  Med List Status: <None>  Medication Order Taking? Sig Documenting Provider Last Dose Status Informant  albuterol (PROVENTIL HFA;VENTOLIN HFA) 108 (90 Base) MCG/ACT inhaler [id number] Yes Inhale 2 puffs into the lungs every 6 (six) hours as needed for wheezing or shortness of breath.  [provider] Taking Active Child  albuterol (PROVENTIL) (2.5 MG/3ML) 0.083% nebulizer solution [id number] Yes Take 2.5 mg by nebulization every 6 (six) hours as needed for wheezing or shortness of breath. [provider] Taking Active   aspirin EC 81 MG tablet [id number] Yes Take 81 mg by mouth daily. [provider] Taking Active Child  atorvastatin (LIPITOR) 80 MG tablet [id number] Yes Take 80 mg by mouth daily. [provider] Taking Active Child  b complex vitamins tablet [id number] Yes Take 1 tablet by mouth daily. [provider] Taking Active Self  budesonide (PULMICORT) 0.5 MG/2ML nebulizer solution [id number] Yes Take 0.5 mg by nebulization 2 (two) times daily. [provider] Taking Active Self  Co-Enzyme Q10 200 MG CAPS [id number] Yes Take 1 capsule by mouth daily. [provider] Taking Active Self  Cyanocobalamin 5000 MCG SUBL [id number] Yes Place 5,000 mcg under the tongue daily. [provider] Taking Active Self  fesoterodine (TOVIAZ) 8 MG TB24 tablet [id number] Yes Take 8 mg by mouth daily. [provider] Taking Active   formoterol (PERFOROMIST) 20 MCG/2ML nebulizer solution [id number] Yes Inhale 20 mcg into the lungs 2 (two) times daily.  [provider] Taking Active   gabapentin (NEURONTIN) 100 MG capsule [id number] Yes Take 100 mg by mouth daily.  [provider] Taking Active   Multiple Vitamin (MULTIVITAMIN WITH MINERALS) TABS tablet [id number] Yes Take 1 tablet by mouth daily. Nicholes Mango, MD Taking Active   Revefenacin 175 MCG/3ML SOLN [id number] No Inhale 175 mcg into the lungs daily. Maretta Bees [provider] Not Taking Active Self         Plan: Follow up with Chris Lozano next week to review his medication regimen to see if he is taking his prescriptions correctly.  Discuss the lack of patient assistance for formoterol and revefenacin.  Chris Lozano, PharmD Clinical Pharmacist [place] [phone]

## 2018-01-07 DIAGNOSIS — R0902 Hypoxemia: Secondary | ICD-10-CM | POA: Diagnosis not present

## 2018-01-07 DIAGNOSIS — J449 Chronic obstructive pulmonary disease, unspecified: Secondary | ICD-10-CM | POA: Diagnosis not present

## 2018-01-07 DIAGNOSIS — R5381 Other malaise: Secondary | ICD-10-CM | POA: Diagnosis not present

## 2018-01-07 DIAGNOSIS — R0602 Shortness of breath: Secondary | ICD-10-CM | POA: Diagnosis not present

## 2018-01-07 DIAGNOSIS — G4733 Obstructive sleep apnea (adult) (pediatric): Secondary | ICD-10-CM | POA: Diagnosis not present

## 2018-01-07 DIAGNOSIS — J438 Other emphysema: Secondary | ICD-10-CM | POA: Diagnosis not present

## 2018-01-07 DIAGNOSIS — J45998 Other asthma: Secondary | ICD-10-CM | POA: Diagnosis not present

## 2018-01-10 ENCOUNTER — Other Ambulatory Visit: Payer: Self-pay

## 2018-01-10 ENCOUNTER — Ambulatory Visit: Payer: PPO

## 2018-01-10 NOTE — Patient Outreach (Signed)
[facility]) Care Management  01/10/2018  CAELAN BRANDEN 20-Jun-1940 [id number]  Successful outreach to Mr. Mander and HIPAA identifers verified.    Mr. Betker verified that he has discontinued HCTZ, KCL and mirabegron.  He states the he has not picked up his atorvastatin.  Call placed to [facility] to confirm that his atorvastatin is ready and patient states he will pick it up. I also had [facility] deactivate HCTZ, KCL and mirabegron from his profile.   [facility] reports a price of $1200  for Revefenacin.  It is not covered under Part B and non formulary under Part D.    Informed Mr. Aldea that there is no patient assistance available for his Revefenacin or Formoterol nebulization solution.  I also checked with the [employer] and [employer].  He verbalized understanding.  I have placed a call to his Pulmonologist to see if he would like to prescribe another medication or see if he has samples.  Plan: Follow up with Mr. Wollman after I hear back from Dr. Wynonia Musty.   Joetta Manners, PharmD Clinical Pharmacist [place] [phone]

## 2018-01-12 ENCOUNTER — Ambulatory Visit: Payer: Self-pay

## 2018-01-16 ENCOUNTER — Ambulatory Visit: Payer: Self-pay

## 2018-01-16 ENCOUNTER — Other Ambulatory Visit: Payer: Self-pay

## 2018-01-16 DIAGNOSIS — J45998 Other asthma: Secondary | ICD-10-CM | POA: Diagnosis not present

## 2018-01-16 DIAGNOSIS — G4733 Obstructive sleep apnea (adult) (pediatric): Secondary | ICD-10-CM | POA: Diagnosis not present

## 2018-01-16 DIAGNOSIS — R0602 Shortness of breath: Secondary | ICD-10-CM | POA: Diagnosis not present

## 2018-01-16 DIAGNOSIS — J438 Other emphysema: Secondary | ICD-10-CM | POA: Diagnosis not present

## 2018-01-16 DIAGNOSIS — R5381 Other malaise: Secondary | ICD-10-CM | POA: Diagnosis not present

## 2018-01-16 DIAGNOSIS — R0902 Hypoxemia: Secondary | ICD-10-CM | POA: Diagnosis not present

## 2018-01-16 DIAGNOSIS — J449 Chronic obstructive pulmonary disease, unspecified: Secondary | ICD-10-CM | POA: Diagnosis not present

## 2018-01-16 NOTE — Patient Outreach (Signed)
[facility]) Care Management  01/16/2018  Chris Lozano [birth year]  [id number]  Out[facility] in [place], [place].  Pharmacist's reports that when trying to process Yupelri, he receives the rejection, " Part D- not covered and Part B- elected for HMO coverage for Part D plan."     E-mail sent to Ples Specter, [employer] representative for help with processing this claim.  Nebulization solutions usually go through Medicare Part B with a 20% co-pay.  Plan: Await call back from [employer] representative.   Addendum: Per [employer], Drug is non formulary and the patient has to do a non-formulary exception for Part D to cover the med.  Incoming call received from [place] at Dr. Babs Sciara office.  Gave her the above information from [employer].   If covered under Part D, the patient will quickly go into the donut hole and the drug will likely have a high co-pay.  If covered under Medicare Part B, the patient will not be able to afford the 20% co-pay given the expensive nature of this drug.   Nurse states that they would like to use the nebulization solution due to the poor lung function of patient.  She states that she will send a message to Dr. Wynonia Musty and see if he will consider switching back to a MDI that is more affordable.  She states she will return may call Wednesday.  Plan: Await return call from Dr. Babs Sciara office.   Joetta Manners, PharmD Clinical Pharmacist [place] [phone]

## 2018-01-17 ENCOUNTER — Other Ambulatory Visit: Payer: Self-pay

## 2018-01-17 ENCOUNTER — Other Ambulatory Visit: Payer: Self-pay | Admitting: Pharmacy Technician

## 2018-01-17 DIAGNOSIS — J438 Other emphysema: Secondary | ICD-10-CM | POA: Diagnosis not present

## 2018-01-17 DIAGNOSIS — R5381 Other malaise: Secondary | ICD-10-CM | POA: Diagnosis not present

## 2018-01-17 DIAGNOSIS — R0902 Hypoxemia: Secondary | ICD-10-CM | POA: Diagnosis not present

## 2018-01-17 DIAGNOSIS — R0602 Shortness of breath: Secondary | ICD-10-CM | POA: Diagnosis not present

## 2018-01-17 DIAGNOSIS — J45998 Other asthma: Secondary | ICD-10-CM | POA: Diagnosis not present

## 2018-01-17 DIAGNOSIS — G4733 Obstructive sleep apnea (adult) (pediatric): Secondary | ICD-10-CM | POA: Diagnosis not present

## 2018-01-17 DIAGNOSIS — J449 Chronic obstructive pulmonary disease, unspecified: Secondary | ICD-10-CM | POA: Diagnosis not present

## 2018-01-17 NOTE — Patient Outreach (Signed)
[facility]) Care Management  01/17/2018  DORAL VENTRELLA [birth year] [id number]                                                  Medication Assistance Referral  Referral From: [place]  Medication/Company: [employer] Respimat / [employer] Patient application portion:  Mail  Provider application portion: Faxed  to Dr. Wynonia Musty   Follow up:  Will follow up with patient in 5-7 business days to confirm application(s) have been received.  Maud Deed Chana Bode [place] Certified Pharmacy Technician [place] Management Direct Dial:[phone]

## 2018-01-17 NOTE — Patient Outreach (Addendum)
[facility]) Care Management  01/17/2018  JORRELL KUSTER 1940-11-04 [id number]  Incoming call received from Izora Gala at Dr. Babs Sciara office.  She states that Dr. Wynonia Musty is going to discontinue revefenacin and start Spiriva Respimat.    Medication Assistance: Will send patient assistance application to patient for Spiriva Respimat made by [employer] and prescribed by Dr. Audie Box.   Mr. Westbrook is aware that he should expect the patient assistance application and that he needs to pick up Spiriva from his pharmacy.  Plan: Route letter to CPhT, Etter Sjogren.  Joetta Manners, PharmD Clinical Pharmacist [place] [phone]

## 2018-01-18 DIAGNOSIS — J449 Chronic obstructive pulmonary disease, unspecified: Secondary | ICD-10-CM | POA: Diagnosis not present

## 2018-01-23 NOTE — Progress Notes (Signed)
[facility]    Assessment and Plan:  The patient is a 77 year old male with severe emphysema, obstructive sleep apnea, evidence of CO2 retention.  He is on 3 L of oxygen chronically.  COPD/emphysema with dyspnea on exertion. -Severe COPD emphysema with CO2 retention/hypercapnia. -Continue 3 L with BiPAP. --s/p flu shot.   Obstructive sleep apnea with hypercapnia. -Discussed importance using BiPAP every night, particularly in regards to his elevated CO2 levels. - Patient has already been to mask fitting clinic.  Advance care planning.08/03/17 Discussed patient's severe baseline respiratory status, he is at high risk of decompensation and hospital admission due to his severe emphysema and hypoxic respiratory failure with chronic hypercapnia.  We discussed that if his heart were to stop or if he would require mechanical ventilation, it would be difficult to wean him off the ventilator. He states that if he were to die he would not want to be brought back, he would not want to be placed on a ventilator or undergo CPR.  He states this in the presence of his family, and the he notes that he is spoken with them about this before, he has a living will in place.  We discussed that his CODE STATUS that would be DNR, and he is in agreement with this.   Return in about 6 months (around 07/26/2018).   Date: 01/23/2018  MRN# [id number] Chris Lozano [birth year]   Chris Lozano is a 77 y.o. old male seen in consultation for chief complaint of:    Chief Complaint  Patient presents with  . Sleep Apnea    pt on bi pap with  3 liters 02 bled in.  . Shortness of Breath    if he over exerts    HPI:   The patient is a 77 year old male with history of COPD, with hypoventilation on bipap.  He is taking symbicort 2 puffs once per day, but forgets the evening dose. She often forgets the spiriva dose. He think he gets winded with moderate activity.  He was started on BiPAP due to  hypoventilation, at his last visit his compliance was poor with residual elevated AHI. He seems to take it off in the middle of the night and does not remember. He leaves it on the whole night and he does not know why some nights he can wear it the whole night and not on others.   Since his last visit he breathing has been better since he was using his nebulizer twice daily. He remains on oxygen at 3L. He continues to try to be active, and has been trying to increase.   **Download data 12/25/2017-01/23/2018>> usage greater than 4 hours is 22/3 days.  Average usage on days used 6 hours 17 minutes.  Setting is V auto minimum EPAP 10, maximum IPAP 16, pressure support 5.  Leaks are elevated.  Residual AHI is 7.1 with a central apnea index of 0.2.  Overall this shows adequate compliance with CPAP with adequate control of obstructive sleep apnea. **Download data 07/04/2017 to 08/02/2017>> personally reviewed usage greater than 4 hours of 6/30 days.  Average usage on days used is 2 hours 40 minutes.  V auto setting max IPAP 16 minimum EPAP 10 pressure support 5, residual AHI 17.6, predominantly obstructive.  This shows very poor compliance with CPAP, with inadequate control of obstructive sleep apnea **chest x-ray 11/28/16; hyperinflation consistent with emphysema, changes of chronic bronchitis **Desat walk on 12/16/16; at rest on 3L sat was 92% and  HR 72. After walking 180 feet sat was 86% and HR 88.  **Download for the past 30 days, as of 03/27/17, usage greater than 4 hours is 17/30 days.  Average use is on days used is 4 3 9  minutes.  Setting is BiPAP auto, maximum IPAP of 16, minimum EPAP is 10, pressure support 5 residual AHI is 12.4.  However on days where use of the BiPAP for the entire night his AHI dropped significantly.   Results for Chris Lozano, Chris Lozano (MRN [id number]) as of 12/14/2016 16:05  Ref. Range 01/18/2012 04:21 09/28/2016 14:52 11/28/2016 21:48 11/29/2016 00:24 11/29/2016 04:16  CO2 Latest Ref Range:  22 - 32 mmol/L 35 (H)  35 (H)  34 (H)   Date: FVC (% Pred) FEV1 (% Pred) Pre-BD FEV1 (%Pred) Post- BD FEV1/FVC FEF25-75(% Pred) DLCO (% Pred)  December 12, 2013 1.83 L (50%) 0.93 L (35%) 1.32 (50%) 51 0.29 (15%) 45%   Social Hx:   Social History   Tobacco Use  . Smoking status: Former Research scientist (life sciences)  . Smokeless tobacco: Former Systems developer    Types: Chew  . Tobacco comment: as a teenager  Substance Use Topics  . Alcohol use: Yes    Alcohol/week: 0.0 standard drinks    Comment: occasionally  . Drug use: No   Medication:    Current Outpatient Medications:  .  albuterol (PROVENTIL HFA;VENTOLIN HFA) 108 (90 Base) MCG/ACT inhaler, Inhale 2 puffs into the lungs every 6 (six) hours as needed for wheezing or shortness of breath. , Disp: , Rfl:  .  albuterol (PROVENTIL) (2.5 MG/3ML) 0.083% nebulizer solution, Take 2.5 mg by nebulization every 6 (six) hours as needed for wheezing or shortness of breath., Disp: , Rfl:  .  aspirin EC 81 MG tablet, Take 81 mg by mouth daily., Disp: , Rfl:  .  atorvastatin (LIPITOR) 80 MG tablet, Take 80 mg by mouth daily., Disp: , Rfl:  .  b complex vitamins tablet, Take 1 tablet by mouth daily., Disp: , Rfl:  .  budesonide (PULMICORT) 0.5 MG/2ML nebulizer solution, Take 0.5 mg by nebulization 2 (two) times daily., Disp: , Rfl:  .  Co-Enzyme Q10 200 MG CAPS, Take 1 capsule by mouth daily., Disp: , Rfl:  .  Cyanocobalamin 5000 MCG SUBL, Place 5,000 mcg under the tongue daily., Disp: , Rfl:  .  fesoterodine (TOVIAZ) 8 MG TB24 tablet, Take 8 mg by mouth daily., Disp: , Rfl:  .  formoterol (PERFOROMIST) 20 MCG/2ML nebulizer solution, Inhale 20 mcg into the lungs 2 (two) times daily. , Disp: , Rfl:  .  gabapentin (NEURONTIN) 100 MG capsule, Take 100 mg by mouth daily. , Disp: , Rfl:  .  Multiple Vitamin (MULTIVITAMIN WITH MINERALS) TABS tablet, Take 1 tablet by mouth daily., Disp: , Rfl:  .  Tiotropium Bromide Monohydrate (SPIRIVA RESPIMAT) 2.5 MCG/ACT AERS, Inhale 2 puffs into  the lungs daily., Disp: , Rfl:    Allergies:  Fluocinolone and Ciprofloxacin hcl  Review of Systems:  Constitutional: Feels well. Cardiovascular: Denies chest pain, exertional chest pain.  Pulmonary: Denies hemoptysis, pleuritic chest pain.   The remainder of systems were reviewed and were found to be negative other than what is documented in the HPI.    Physical Examination:   VS: BP 129/88 (BP Location: Left Arm, Cuff Size: Normal)   Pulse 65   Resp 16   Ht 5' 5.5" (1.664 m)   Wt 173 lb (78.5 kg)   SpO2 95%   BMI 28.35 kg/m  General Appearance: No distress  Neuro:without focal findings, mental status, speech normal, alert and oriented HEENT: PERRLA, EOM intact Pulmonary: No wheezing, No rales decrease air entry bilaterally.  CardiovascularNormal S1,S2.  No m/r/g.  Abdomen: Benign, Soft, non-tender, No masses Renal:  No costovertebral tenderness  GU:  No performed at this time. Endoc: No evident thyromegaly, no signs of acromegaly or Cushing features Skin:   warm, no rashes, no ecchymosis  Extremities: normal, no cyanosis, clubbing.      LABORATORY PANEL:   CBC No results for input(s): WBC, HGB, HCT, PLT in the last 168 hours. ------------------------------------------------------------------------------------------------------------------  Chemistries  No results for input(s): NA, K, CL, CO2, GLUCOSE, BUN, CREATININE, CALCIUM, MG, AST, ALT, ALKPHOS, BILITOT in the last 168 hours.  Invalid input(s): GFRCGP ------------------------------------------------------------------------------------------------------------------  Cardiac Enzymes No results for input(s): TROPONINI in the last 168 hours. ------------------------------------------------------------  RADIOLOGY:  No results found.     Thank  you for the consultation and for allowing [facility] to assist in the care of your patient. Our recommendations are noted above.  Please  contact us if we can be of further service.  Marda Stalker, M.D., F.C.C.P.  Board Certified in Internal Medicine, Pulmonary Medicine, [place], and Sleep Medicine.  [facility] Office Number: [phone]   01/23/2018

## 2018-01-24 ENCOUNTER — Ambulatory Visit: Payer: PPO | Admitting: Internal Medicine

## 2018-01-24 ENCOUNTER — Encounter: Payer: Self-pay | Admitting: Internal Medicine

## 2018-01-24 VITALS — BP 129/88 | HR 65 | Resp 16 | Ht 65.5 in | Wt 173.0 lb

## 2018-01-24 DIAGNOSIS — J9612 Chronic respiratory failure with hypercapnia: Secondary | ICD-10-CM | POA: Diagnosis not present

## 2018-01-24 DIAGNOSIS — J9611 Chronic respiratory failure with hypoxia: Secondary | ICD-10-CM

## 2018-01-24 DIAGNOSIS — G4733 Obstructive sleep apnea (adult) (pediatric): Secondary | ICD-10-CM

## 2018-01-24 DIAGNOSIS — R0609 Other forms of dyspnea: Secondary | ICD-10-CM | POA: Diagnosis not present

## 2018-01-24 DIAGNOSIS — J449 Chronic obstructive pulmonary disease, unspecified: Secondary | ICD-10-CM

## 2018-01-24 NOTE — Patient Instructions (Signed)
Continue using current inhaled medications, and PAP every night.

## 2018-01-30 ENCOUNTER — Other Ambulatory Visit: Payer: Self-pay | Admitting: Pharmacy Technician

## 2018-01-30 NOTE — Patient Outreach (Signed)
[facility]) Care Management  01/30/2018  [name] 02-08-1941 [id number]   Received patient portion of [place] patient assistance application for Spiriva Respimat. Faxed completed application and required documents into company.  Will follow up with company in 7-10 business days.  Maud Deed Chana Bode [place] Certified Pharmacy Technician [place] Management Direct Dial:[phone]

## 2018-02-06 ENCOUNTER — Other Ambulatory Visit: Payer: Self-pay | Admitting: Pharmacy Technician

## 2018-02-06 DIAGNOSIS — R0602 Shortness of breath: Secondary | ICD-10-CM | POA: Diagnosis not present

## 2018-02-06 DIAGNOSIS — J45998 Other asthma: Secondary | ICD-10-CM | POA: Diagnosis not present

## 2018-02-06 DIAGNOSIS — R0902 Hypoxemia: Secondary | ICD-10-CM | POA: Diagnosis not present

## 2018-02-06 DIAGNOSIS — J449 Chronic obstructive pulmonary disease, unspecified: Secondary | ICD-10-CM | POA: Diagnosis not present

## 2018-02-06 DIAGNOSIS — G4733 Obstructive sleep apnea (adult) (pediatric): Secondary | ICD-10-CM | POA: Diagnosis not present

## 2018-02-06 DIAGNOSIS — J438 Other emphysema: Secondary | ICD-10-CM | POA: Diagnosis not present

## 2018-02-06 DIAGNOSIS — R5381 Other malaise: Secondary | ICD-10-CM | POA: Diagnosis not present

## 2018-02-06 NOTE — Patient Outreach (Signed)
[place] [facility]) Care Management  02/06/2018  Chris Lozano 12-22-1940 [id number]    Follow up call placed to [employer] regarding patient assistance application(s) for Spiriva Respimat , Manuela Schwartz confirms patient has been approved as of 02/02/18 until 02/08/2019.    Successful call placed to patient regarding patient assistance update for Spiriva Respimat, HIPAA identifiers verified. Informed patient of approval and informed him that medication should be delivered in the next 7-10 business days.  Will follow up with patient in 10-14 business days to confirm medication has been received.   Maud Deed Chana Bode [place] Certified Pharmacy Technician [place] Management Direct Dial:[phone]

## 2018-02-20 ENCOUNTER — Other Ambulatory Visit: Payer: Self-pay | Admitting: Pharmacy Technician

## 2018-02-20 ENCOUNTER — Other Ambulatory Visit: Payer: Self-pay

## 2018-02-20 NOTE — Patient Outreach (Signed)
[facility]) Care Management  02/20/2018  Chris Lozano Dec 16, 1940 [id number]    Successful call placed to patient regarding patient assistance medication receipt from [facility], HIPAA identifiers verified. [name] confirms that he received his Spiriva from [employer], reviewed with patient how to obtain refills and requested that he contact me if he runs into any issues doing so.  Will route note to [place] for case closure.   Maud Deed Chana Bode [place] Certified Pharmacy Technician [place] Management Direct Dial:[phone]

## 2018-02-20 NOTE — Patient Outreach (Signed)
[facility]) Care Management [place]  02/20/2018  Chris Lozano 12-29-1940 [id number]  Reason for referral: medication assistance  [facility] pharmacy case is being closed due to the following reasons:   Goals have been met.  Patient approved for Spiriva patient assistance.  Patient has been provided [facility] CM contact information if assistance needed in the future.    Thank you for allowing [facility] pharmacy to be involved in this patient's care.    Joetta Manners, PharmD Clinical Pharmacist [place] [phone]

## 2018-04-19 ENCOUNTER — Other Ambulatory Visit: Payer: Self-pay

## 2018-04-19 NOTE — Patient Outreach (Addendum)
[facility] [facility]) Care Management  04/19/2018  NAPOLEAN SIA July 20, 1940 [id number]   Incoming call received from Mr. Hayashida.  HIPAA identifiers verified.   Mr. Gaughan states that he is out of Spiriva Respimat and cannot find his paperwork that tells him how to reorder it through patient assistance from [facility].    Three way call placed to BI to reorder his prescription. They report that it is too early to refill.  Next fill is available on 05/10/18 and he can call to reorder on 04/28/18.  Verified that the directions are correct.  Gave Mr. Haberland the phone number and instructions on how to reorder this medication.   Mr. Strahle reports that he will look and see if he has misplaced his inhaler.  Requested that he call me back and let me know if he finds a Spiriva at home.   Plan: Await call back from Mr. Marcella.  Joetta Manners, [place] [phone].    Addendum: Call placed to Mr. Hommes and HIPAA identifiers verified.  He reports that he was not able to find a Spiriva inhaler at home.   Plan: Will outreach to PCP office and see if they can provide him with a sample until he can reorder next week.   Joetta Manners, PharmD Clinical Pharmacist [place] [phone]  Addendum: Call placed to Dr. Olin Pia office.  They do not give out samples.   Successful outreach to Mr. Pollio.  HIPAA identifiers verified.   Informed Mr. Ke that his PCP does not have any samples of Spiriva Respimat.  Encouraged him to get one from his pharmacy until he can get his new shipment in from the manufacturer.   He is aware that he can reorder on 04/28/18.  Gave him CPhT, Judene Companion phone number in case he has further questions.  He verbalized understanding.   Joetta Manners, PharmD Clinical Pharmacist [place] [phone]

## 2018-04-25 ENCOUNTER — Ambulatory Visit: Payer: PPO

## 2018-06-20 ENCOUNTER — Other Ambulatory Visit: Payer: Self-pay | Admitting: Pharmacy Technician

## 2018-06-20 NOTE — Patient Outreach (Signed)
[place]) Care Management  06/20/2018  Chris Lozano March 20, 1940 [id number]   Incoming call from patients daughter requesting assistance with Perforomist. Explained to her that patient is no longer eligible for [place]. Informed her that there aren't currently any patient assistance programs for this medication. Gave her information to [employer] and explained that there aren't currently any funds available but suggested she periodically check. She was appreciative of information that I was able to provide.  Maud Deed Chana Bode [place] Certified Pharmacy Technician [place] Management Direct [phone]

## 2018-08-07 ENCOUNTER — Telehealth: Payer: Self-pay | Admitting: Internal Medicine

## 2018-08-07 NOTE — Telephone Encounter (Signed)
Called patient for COVID-19 pre-screening for in office visit.  Have you recently traveled any where out of the local area in the last 2 weeks? No  Have you been in close contact with a person diagnosed with COVID-19 or someone awaiting results within the last 2 weeks? No  Do you currently have any of the following symptoms? If so, when did they start? Cough     Diarrhea   Joint Pain Fever      Muscle Pain   Red eyes Shortness of breath (Yes- COPD)    Abdominal painVomiting Loss of smell    Rash    Sore Throat Headache    Weakness   Bruising or bleeding   Okay to proceed with visit 08/08/2018

## 2018-08-07 NOTE — Progress Notes (Signed)
Chris Lozano    Assessment and Plan:  The patient is a 78 year old male with severe emphysema, obstructive sleep apnea, evidence of CO2 retention.  He is on 3 L of oxygen chronically.  COPD/emphysema with dyspnea on exertion. -Severe COPD emphysema with CO2 retention/hypercapnia. -Continue 3 L with BiPAP. --Patient is not certain of the inhaled medications that he is taking, he is asked to call us back with all of his inhalers that we could advise him the proper use with him.  Chronic hypoxic and hypercapnic respiratory failure. - Continue oxygen, BiPAP.  Obstructive sleep apnea. -Patient is currently on auto BiPAP (V auto) max IPAP 16, minimum EPAP is 10, pressure support 5, to continue. - Patient has already been to mask fitting clinic.  Advance care planning.08/03/17 Discussed patient's severe baseline respiratory status, he is at high risk of decompensation and hospital admission due to his severe emphysema and hypoxic respiratory failure with chronic hypercapnia.  We discussed that if his heart were to stop or if he would require mechanical ventilation, it would be difficult to wean him off the ventilator. He states that if he were to die he would not want to be brought back, he would not want to be placed on a ventilator or undergo CPR.  He states this in the presence of his family, and the he notes that he is spoken with them about this before, he has a living will in place.  We discussed that his CODE STATUS that would be DNR, and he is in agreement with this.   Return in about 6 months (around 02/07/2019).   Date: 08/07/2018  MRN# [id number] Chris Lozano 01/14/1941   Chris Lozano is a 78 y.o. old male seen in consultation for chief complaint of:    Chief Complaint  Patient presents with  . Follow-up    pt states breathing is fair. c/o sob with exertion. on 2L cont. wearing bipap nightly- feels pressure & mask are okay.    HPI:   Chris Lozano is a  78 y.o. male  with history of COPD, with hypoventilation on bipap.  At last visit he was noted to have occasionally forget his inhalers, he was reminded to try to take them regularly.  He was reminded to use his BiPAP nightly.  He had remained on 3 L of oxygen.  Since his last visit he feels that he is breathing is doing ok, it is stable, unchanged. He is taking spiriva respimat 2 puffs once daily. He is not sure if he is taking symbicort.  He is oxygen portable at 2L. He remains on Bipap every night.    **Download data 07/07/2018-08/05/2018>> raw data personally reviewed, usage greater than 4 hours 28/30 days.  Average usage on days used 7 hours 7 minutes.  Mode is V auto, max IPAP 16, minimum EPAP is 10, pressure support 5.  Leaks are within normal limits, residual AHI is 2.9.  Overall this shows very good compliance with BiPAP auto and excellent control of obstructive sleep apnea. **Download data 12/25/2017-01/23/2018>> usage greater than 4 hours is 22/3 days.  Average usage on days used 6 hours 17 minutes.  Setting is V auto minimum EPAP 10, maximum IPAP 16, pressure support 5.  Leaks are elevated.  Residual AHI is 7.1 with a central apnea index of 0.2.  Overall this shows adequate compliance with CPAP with adequate control of obstructive sleep apnea. **Download data 07/04/2017 to 08/02/2017>> personally reviewed usage greater than 4  hours of 6/30 days.  Average usage on days used is 2 hours 40 minutes.  V auto setting max IPAP 16 minimum EPAP 10 pressure support 5, residual AHI 17.6, predominantly obstructive.  This shows very poor compliance with CPAP, with inadequate control of obstructive sleep apnea **chest x-ray 11/28/16; hyperinflation consistent with emphysema, changes of chronic bronchitis **Desat walk on 12/16/16; at rest on 3L sat was 92% and HR 72. After walking 180 feet sat was 86% and HR 88.  **Download for the past 30 days, as of 03/27/17, usage greater than 4 hours is 17/30 days.  Average  use is on days used is 4 3 9  minutes.  Setting is BiPAP auto, maximum IPAP of 16, minimum EPAP is 10, pressure support 5 residual AHI is 12.4.  However on days where use of the BiPAP for the entire night his AHI dropped significantly.   Results for Chris Lozano, Chris Lozano (MRN [id number]) as of 12/14/2016 16:05  Ref. Range 01/18/2012 04:21 09/28/2016 14:52 11/28/2016 21:48 11/29/2016 00:24 11/29/2016 04:16  CO2 Latest Ref Range: 22 - 32 mmol/L 35 (H)  35 (H)  34 (H)   Date: FVC (% Pred) FEV1 (% Pred) Pre-BD FEV1 (%Pred) Post- BD FEV1/FVC FEF25-75(% Pred) DLCO (% Pred)  December 12, 2013 1.83 L (50%) 0.93 L (35%) 1.32 (50%) 51 0.29 (15%) 45%   Social Hx:   Social History   Tobacco Use  . Smoking status: Former Research scientist (life sciences)  . Smokeless tobacco: Former Systems developer    Types: Chew  . Tobacco comment: as a teenager  Substance Use Topics  . Alcohol use: Yes    Alcohol/week: 0.0 standard drinks    Comment: occasionally  . Drug use: No   Medication:    Current Outpatient Medications:  .  albuterol (PROVENTIL HFA;VENTOLIN HFA) 108 (90 Base) MCG/ACT inhaler, Inhale 2 puffs into the lungs every 6 (six) hours as needed for wheezing or shortness of breath. , Disp: , Rfl:  .  albuterol (PROVENTIL) (2.5 MG/3ML) 0.083% nebulizer solution, Take 2.5 mg by nebulization every 6 (six) hours as needed for wheezing or shortness of breath., Disp: , Rfl:  .  aspirin EC 81 MG tablet, Take 81 mg by mouth daily., Disp: , Rfl:  .  atorvastatin (LIPITOR) 80 MG tablet, Take 80 mg by mouth daily., Disp: , Rfl:  .  b complex vitamins tablet, Take 1 tablet by mouth daily., Disp: , Rfl:  .  budesonide (PULMICORT) 0.5 MG/2ML nebulizer solution, Take 0.5 mg by nebulization 2 (two) times daily., Disp: , Rfl:  .  Co-Enzyme Q10 200 MG CAPS, Take 1 capsule by mouth daily., Disp: , Rfl:  .  Cyanocobalamin 5000 MCG SUBL, Place 5,000 mcg under the tongue daily., Disp: , Rfl:  .  formoterol (PERFOROMIST) 20 MCG/2ML nebulizer solution, Inhale 20 mcg  into the lungs 2 (two) times daily. , Disp: , Rfl:  .  gabapentin (NEURONTIN) 100 MG capsule, Take 100 mg by mouth daily. , Disp: , Rfl:  .  Multiple Vitamin (MULTIVITAMIN WITH MINERALS) TABS tablet, Take 1 tablet by mouth daily., Disp: , Rfl:  .  Tiotropium Bromide Monohydrate (SPIRIVA RESPIMAT) 2.5 MCG/ACT AERS, Inhale 2 puffs into the lungs daily., Disp: , Rfl:    Allergies:  Fluocinolone and Ciprofloxacin hcl  Review of Systems:  Constitutional: Feels well. Cardiovascular: Denies chest pain, exertional chest pain.  Pulmonary: Denies hemoptysis, pleuritic chest pain.   The remainder of systems were reviewed and were found to be negative other than what  is documented in the HPI.    Physical Examination:   VS: BP 128/78 (BP Location: Left Arm, Cuff Size: Normal)   Pulse 83   Temp 97.7 F (36.5 C) (Temporal)   Ht 5' 5.5" (1.664 m)   Wt 180 lb (81.6 kg)   SpO2 97%   BMI 29.50 kg/m   General Appearance: No distress  Neuro:without focal findings, mental status, speech normal, alert and oriented HEENT: PERRLA, EOM intact Pulmonary: No wheezing, No rales  CardiovascularNormal S1,S2.  No m/r/g.  Abdomen: Benign, Soft, non-tender, No masses Renal:  No costovertebral tenderness  GU:  No performed at this time. Endoc: No evident thyromegaly, no signs of acromegaly or Cushing features Skin:   warm, no rashes, no ecchymosis  Extremities: normal, no cyanosis, clubbing.    LABORATORY PANEL:   CBC No results for input(s): WBC, HGB, HCT, PLT in the last 168 hours. ------------------------------------------------------------------------------------------------------------------  Chemistries  No results for input(s): NA, K, CL, CO2, GLUCOSE, BUN, CREATININE, CALCIUM, MG, AST, ALT, ALKPHOS, BILITOT in the last 168 hours.  Invalid input(s): GFRCGP ------------------------------------------------------------------------------------------------------------------  Cardiac Enzymes No  results for input(s): TROPONINI in the last 168 hours. ------------------------------------------------------------  RADIOLOGY:  No results found.     Thank  you for the consultation and for allowing [facility] to assist in the care of your patient. Our recommendations are noted above.  Please contact us if we can be of further service.  Marda Stalker, M.D., F.C.C.P.  Board Certified in Internal [place], Pulmonary [place], [place], and Sleep [place].  [facility] Office Number: [phone]   08/07/2018

## 2018-08-08 ENCOUNTER — Other Ambulatory Visit: Payer: Self-pay

## 2018-08-08 ENCOUNTER — Ambulatory Visit: Payer: Medicare Other | Admitting: Internal Medicine

## 2018-08-08 ENCOUNTER — Telehealth: Payer: Self-pay | Admitting: Internal Medicine

## 2018-08-08 ENCOUNTER — Encounter: Payer: Self-pay | Admitting: Internal Medicine

## 2018-08-08 VITALS — BP 128/78 | HR 83 | Temp 97.7°F | Ht 65.5 in | Wt 180.0 lb

## 2018-08-08 DIAGNOSIS — G4733 Obstructive sleep apnea (adult) (pediatric): Secondary | ICD-10-CM | POA: Diagnosis not present

## 2018-08-08 DIAGNOSIS — R0609 Other forms of dyspnea: Secondary | ICD-10-CM

## 2018-08-08 DIAGNOSIS — J449 Chronic obstructive pulmonary disease, unspecified: Secondary | ICD-10-CM | POA: Diagnosis not present

## 2018-08-08 DIAGNOSIS — J9611 Chronic respiratory failure with hypoxia: Secondary | ICD-10-CM

## 2018-08-08 DIAGNOSIS — J9612 Chronic respiratory failure with hypercapnia: Secondary | ICD-10-CM

## 2018-08-08 NOTE — Telephone Encounter (Signed)
Pt was instructed to call with list of medication. Pt stated that his daughter has wrote a list of his medication and he will mail a copy to our office.  Nothing further is needed at this time.   Will route to DR as an [place].

## 2018-08-08 NOTE — Patient Instructions (Addendum)
Please call us when you get home and let us know all the inhalers that your are taking.  Continue on oxygen, continue using bipap every night.

## 2018-11-30 ENCOUNTER — Other Ambulatory Visit: Payer: Self-pay

## 2018-11-30 ENCOUNTER — Ambulatory Visit
Admission: RE | Admit: 2018-11-30 | Discharge: 2018-11-30 | Disposition: A | Discharge status: Home / Self Care | Payer: Medicare Other | Source: Ambulatory Visit | Attending: Infectious Diseases | Admitting: Infectious Diseases

## 2018-11-30 ENCOUNTER — Other Ambulatory Visit
Admission: RE | Admit: 2018-11-30 | Discharge: 2018-11-30 | Disposition: A | Discharge status: Home / Self Care | Payer: Medicare Other | Source: Ambulatory Visit | Attending: *Deleted | Admitting: *Deleted

## 2018-11-30 ENCOUNTER — Other Ambulatory Visit: Payer: Self-pay | Admitting: Infectious Diseases

## 2018-11-30 DIAGNOSIS — R55 Syncope and collapse: Secondary | ICD-10-CM | POA: Insufficient documentation

## 2018-11-30 LAB — TROPONIN I (HIGH SENSITIVITY): Troponin I (High Sensitivity): 5 ng/L (ref <18)

## 2018-12-27 ENCOUNTER — Other Ambulatory Visit: Payer: Self-pay | Admitting: Cardiology

## 2018-12-27 DIAGNOSIS — Z01818 Encounter for other preprocedural examination: Secondary | ICD-10-CM

## 2019-01-05 ENCOUNTER — Other Ambulatory Visit
Admission: RE | Admit: 2019-01-05 | Discharge: 2019-01-05 | Disposition: A | Discharge status: Home / Self Care | Payer: Medicare Other | Source: Ambulatory Visit | Attending: Cardiology | Admitting: Cardiology

## 2019-01-05 DIAGNOSIS — Z01812 Encounter for preprocedural laboratory examination: Secondary | ICD-10-CM | POA: Diagnosis present

## 2019-01-05 DIAGNOSIS — Z20828 Contact with and (suspected) exposure to other viral communicable diseases: Secondary | ICD-10-CM | POA: Insufficient documentation

## 2019-01-05 LAB — SARS CORONAVIRUS 2 (TAT 6-24 HRS): SARS Coronavirus 2: NEGATIVE

## 2019-01-07 IMAGING — DX DG CHEST 1V PORT
1 series · 1 of 1 positions shown · non-contrast
Comparison: 01/16/2012

CLINICAL DATA: Acute onset of dyspnea.

EXAM:
PORTABLE CHEST 1 VIEW

[chest ap]
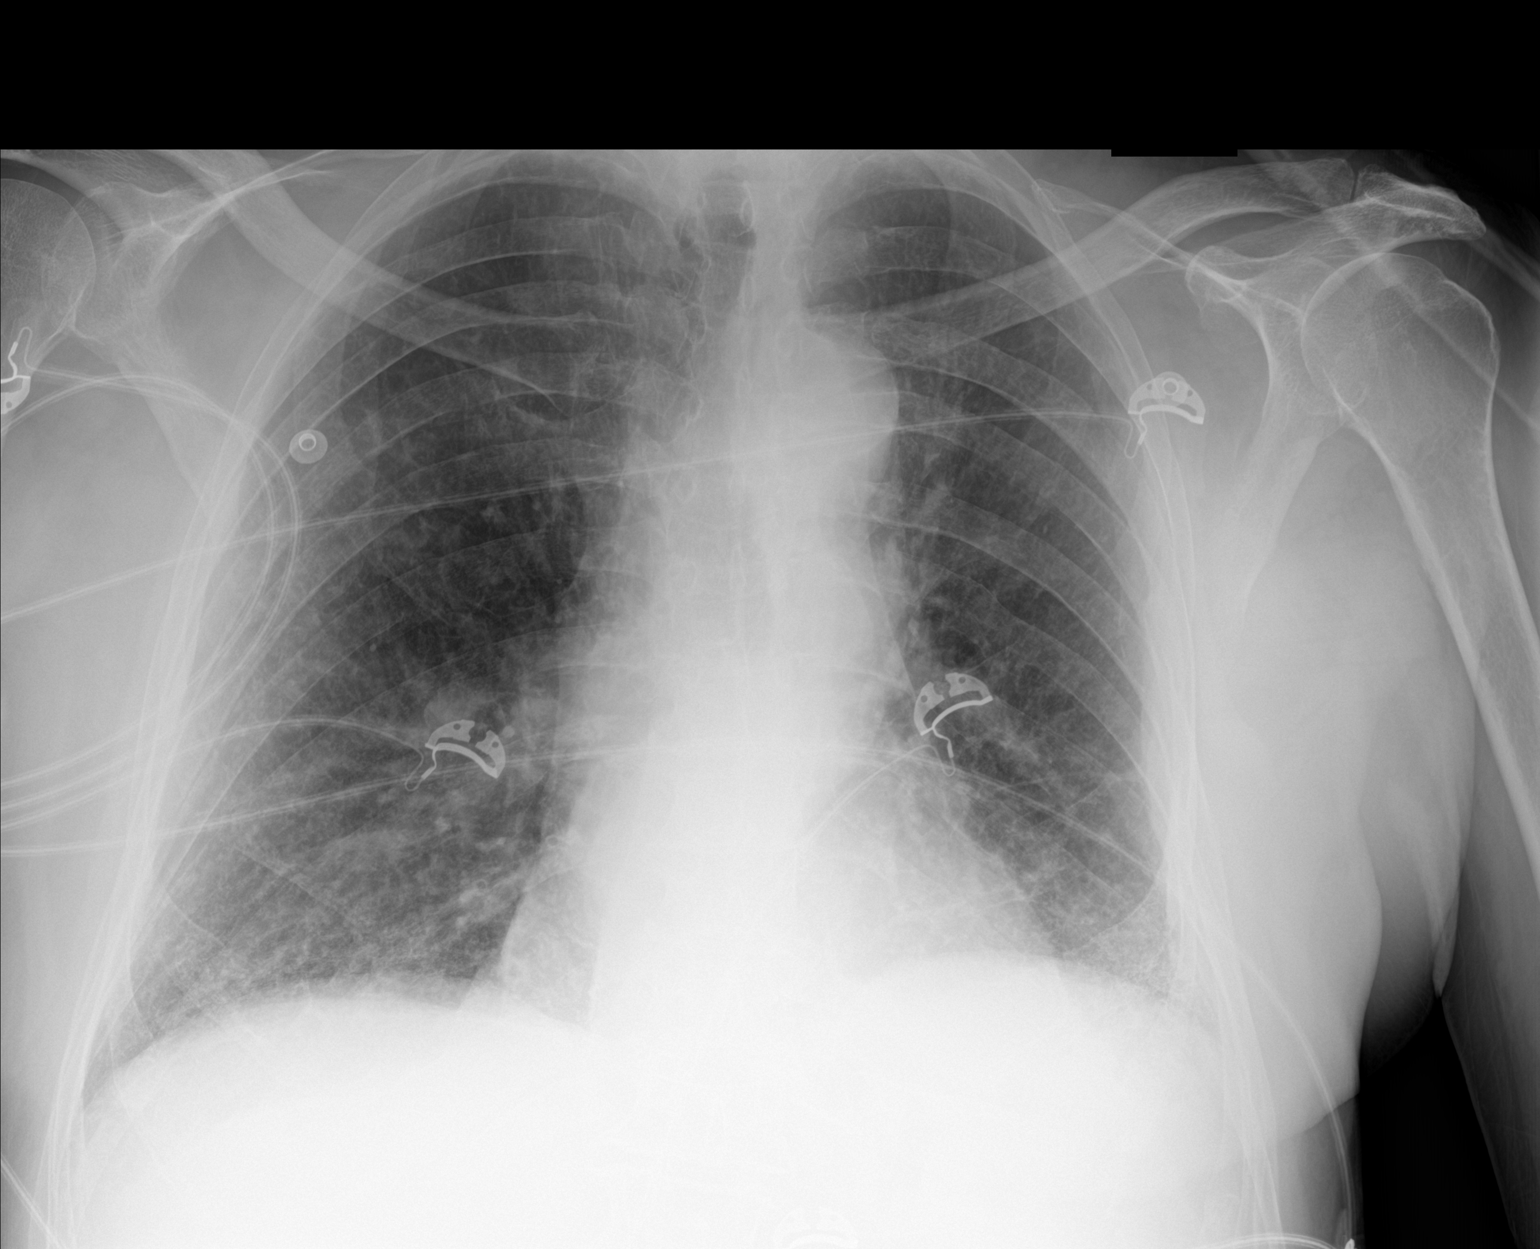

[1 of 1 positions shown; findings below may reference images not displayed]

FINDINGS: Portable AP upright view of the chest. Borderline cardiomegaly.
Tortuous thoracic aorta with minimal aortic atherosclerosis. No
aneurysm. Mild interstitial edema without significant pleural
effusion. No pneumonic consolidation or pneumothorax.
IMPRESSION: Borderline cardiomegaly with mild interstitial edema.

## 2019-01-08 ENCOUNTER — Other Ambulatory Visit: Payer: Medicare Other

## 2019-01-10 ENCOUNTER — Encounter
Admission: RE | Disposition: A | Discharge status: Home / Self Care | Payer: Self-pay | Source: Home / Self Care | Attending: Cardiology

## 2019-01-10 ENCOUNTER — Encounter: Payer: Self-pay | Admitting: *Deleted

## 2019-01-10 ENCOUNTER — Ambulatory Visit
Admission: RE | Admit: 2019-01-10 | Discharge: 2019-01-10 | Disposition: A | Discharge status: Home / Self Care | Payer: Medicare Other | Attending: Cardiology | Admitting: Cardiology

## 2019-01-10 ENCOUNTER — Other Ambulatory Visit: Payer: Self-pay

## 2019-01-10 ENCOUNTER — Other Ambulatory Visit: Payer: Self-pay | Admitting: Cardiology

## 2019-01-10 DIAGNOSIS — J449 Chronic obstructive pulmonary disease, unspecified: Secondary | ICD-10-CM | POA: Insufficient documentation

## 2019-01-10 DIAGNOSIS — Z7951 Long term (current) use of inhaled steroids: Secondary | ICD-10-CM | POA: Diagnosis not present

## 2019-01-10 DIAGNOSIS — Z7982 Long term (current) use of aspirin: Secondary | ICD-10-CM | POA: Insufficient documentation

## 2019-01-10 DIAGNOSIS — N400 Enlarged prostate without lower urinary tract symptoms: Secondary | ICD-10-CM | POA: Diagnosis not present

## 2019-01-10 DIAGNOSIS — Z8249 Family history of ischemic heart disease and other diseases of the circulatory system: Secondary | ICD-10-CM | POA: Diagnosis not present

## 2019-01-10 DIAGNOSIS — R55 Syncope and collapse: Secondary | ICD-10-CM | POA: Insufficient documentation

## 2019-01-10 DIAGNOSIS — I2510 Atherosclerotic heart disease of native coronary artery without angina pectoris: Secondary | ICD-10-CM | POA: Diagnosis not present

## 2019-01-10 DIAGNOSIS — R9439 Abnormal result of other cardiovascular function study: Secondary | ICD-10-CM

## 2019-01-10 DIAGNOSIS — E782 Mixed hyperlipidemia: Secondary | ICD-10-CM | POA: Insufficient documentation

## 2019-01-10 DIAGNOSIS — I700 Atherosclerosis of aorta: Secondary | ICD-10-CM | POA: Insufficient documentation

## 2019-01-10 DIAGNOSIS — G4733 Obstructive sleep apnea (adult) (pediatric): Secondary | ICD-10-CM | POA: Diagnosis not present

## 2019-01-10 DIAGNOSIS — Z79899 Other long term (current) drug therapy: Secondary | ICD-10-CM | POA: Diagnosis not present

## 2019-01-10 DIAGNOSIS — Z9981 Dependence on supplemental oxygen: Secondary | ICD-10-CM | POA: Diagnosis not present

## 2019-01-10 DIAGNOSIS — I739 Peripheral vascular disease, unspecified: Secondary | ICD-10-CM | POA: Insufficient documentation

## 2019-01-10 DIAGNOSIS — Z87891 Personal history of nicotine dependence: Secondary | ICD-10-CM | POA: Insufficient documentation

## 2019-01-10 DIAGNOSIS — K219 Gastro-esophageal reflux disease without esophagitis: Secondary | ICD-10-CM | POA: Insufficient documentation

## 2019-01-10 DIAGNOSIS — Z79890 Hormone replacement therapy (postmenopausal): Secondary | ICD-10-CM | POA: Diagnosis not present

## 2019-01-10 HISTORY — PX: LEFT HEART CATH AND CORONARY ANGIOGRAPHY: CATH118249

## 2019-01-10 SURGERY — LEFT HEART CATH AND CORONARY ANGIOGRAPHY
Anesthesia: Moderate Sedation | Laterality: Left

## 2019-01-10 MED ORDER — MIDAZOLAM HCL 2 MG/2ML IJ SOLN
INTRAMUSCULAR | Status: DC | PRN
  Administered 2019-01-10: 1 mg via INTRAVENOUS

## 2019-01-10 MED ORDER — FENTANYL CITRATE (PF) 100 MCG/2ML IJ SOLN
INTRAMUSCULAR | Status: DC | PRN
  Administered 2019-01-10: 25 ug via INTRAVENOUS

## 2019-01-10 MED ORDER — SODIUM CHLORIDE 0.9 % IV SOLN: 250.0000 mL | INTRAVENOUS | Status: DC | PRN

## 2019-01-10 MED ORDER — ASPIRIN 81 MG PO CHEW
81.0000 mg | CHEWABLE_TABLET | ORAL | Status: AC
  Administered 2019-01-10: 07:00:00 81 mg via ORAL

## 2019-01-10 MED ORDER — SODIUM CHLORIDE 0.9 % WEIGHT BASED INFUSION: 1.0000 mL/kg/h | INTRAVENOUS | Status: DC

## 2019-01-10 MED ORDER — IOHEXOL 300 MG/ML  SOLN
INTRAMUSCULAR | Status: DC | PRN
  Administered 2019-01-10: 96 mL

## 2019-01-10 MED ORDER — SODIUM CHLORIDE 0.9% FLUSH: 3.0000 mL | Freq: Two times a day (BID) | INTRAVENOUS | Status: DC

## 2019-01-10 MED ORDER — HEPARIN (PORCINE) IN NACL 1000-0.9 UT/500ML-% IV SOLN
INTRAVENOUS | Status: AC
  Filled 2019-01-10: qty 1000

## 2019-01-10 MED ORDER — SODIUM CHLORIDE 0.9 % WEIGHT BASED INFUSION
3.0000 mL/kg/h | INTRAVENOUS | Status: AC
  Administered 2019-01-10: 3 mL/kg/h via INTRAVENOUS

## 2019-01-10 MED ORDER — ONDANSETRON HCL 4 MG/2ML IJ SOLN
4.0000 mg | Freq: Once | INTRAMUSCULAR | Status: AC
  Administered 2019-01-10: 10:00:00 4 mg via INTRAVENOUS

## 2019-01-10 MED ORDER — ASPIRIN 81 MG PO CHEW
CHEWABLE_TABLET | ORAL | Status: AC
  Administered 2019-01-10: 81 mg via ORAL
  Filled 2019-01-10: qty 1

## 2019-01-10 MED ORDER — SODIUM CHLORIDE 0.9% FLUSH: 3.0000 mL | INTRAVENOUS | Status: DC | PRN

## 2019-01-10 MED ORDER — FENTANYL CITRATE (PF) 100 MCG/2ML IJ SOLN
INTRAMUSCULAR | Status: AC
  Filled 2019-01-10: qty 2

## 2019-01-10 MED ORDER — ONDANSETRON HCL 4 MG/2ML IJ SOLN
INTRAMUSCULAR | Status: AC
  Administered 2019-01-10: 4 mg via INTRAVENOUS
  Filled 2019-01-10: qty 2

## 2019-01-10 MED ORDER — MIDAZOLAM HCL 2 MG/2ML IJ SOLN
INTRAMUSCULAR | Status: AC
  Filled 2019-01-10: qty 2

## 2019-01-10 MED ORDER — HEPARIN (PORCINE) IN NACL 1000-0.9 UT/500ML-% IV SOLN
INTRAVENOUS | Status: DC | PRN
  Administered 2019-01-10: 500 mL

## 2019-01-10 SURGICAL SUPPLY — 11 items
CATH INFINITI 5FR ANG PIGTAIL (CATHETERS) ×2 IMPLANT
CATH INFINITI 5FR JL4 (CATHETERS) ×2 IMPLANT
CATH INFINITI 5FR JL5 (CATHETERS) ×2 IMPLANT
CATH INFINITI JR4 5F (CATHETERS) ×2 IMPLANT
DEVICE CLOSURE MYNXGRIP 5F (Vascular Products) ×2 IMPLANT
KIT MANI 3VAL PERCEP (MISCELLANEOUS) ×3 IMPLANT
NDL PERC 18GX7CM (NEEDLE) IMPLANT
NEEDLE PERC 18GX7CM (NEEDLE) ×3 IMPLANT
PACK CARDIAC CATH (CUSTOM PROCEDURE TRAY) ×3 IMPLANT
SHEATH AVANTI 5FR X 11CM (SHEATH) ×2 IMPLANT
WIRE GUIDERIGHT .035X150 (WIRE) ×2 IMPLANT

## 2019-01-10 NOTE — H&P (Signed)
Chief Complaint: Chief Complaint  Patient presents with  . Follow-up  echo and carotid  Date of Service: 12/27/2018 Date of Birth: 10/23/1940 PCP: Cheryll Cockayne, MD  History of Present Illness: Chris. Chris Lozano is a 78 y.o.male patient with a past medical history significant for coronary artery disease diagnosed with catheterization in 2010, treated medically, aortic atherosclerosis, oxygen-dependent emphysema, obstructive sleep apnea, on CPAP, hyperlipidemia, and peripheral vascular disease who presents to review echocardiogram and carotid US results. He was originally referred back to the clinic following a syncopal episode. A few weeks ago, Chris Lozano was driving when everything went black. He denies associated chest pain or palpitations. Denies recurrent syncopal or presyncopal events since. He also admits to a several month history of worsening exertional shortness of breath; currently on 2L supplemental O2 24/7 with use of CPAP at night. He denies lower extremity swelling, orthopnea, or PND.   We discussed the results of recent Holter monitor, echocardiogram, and carotid US at today's visit. Holter monitor revealed short runs of supraventricular tachycardia, with the longest run of 10 beats, but Chris. Lozano denies any symptoms while wearing the monitor. No evidence of ventricular arrhythmias or prolonged pauses. Echocardiogram revealed moderate LV systolic dysfunction with an EF estimated between 35-40% with mild LVH, mild Chris, mild TR - change from echocardiogram in 2018. Carotid US was negative for flow-limiting stenosis. Cardiac catheterization in 2010 revealed 60% stenosis in OM1 - medical management was recommended.   Past Medical and Surgical History  Past Medical History Past Medical History:  Diagnosis Date  . Arteriovenous fistula of pulmonary vessels (CMS-HCC) 09/30/2015  4 mm LUL AV fistula, incidentally noted on screening lung CT. Evaluated by Dr. Lavone Neri [facility]); monitoring recommended.   . Asthma without status asthmaticus  . Benign prostatic hypertrophy  . Colon polyp  . COPD (chronic obstructive pulmonary disease) (CMS-HCC)  . Coronary artery disease  Cath 2010 with 60% OM1 stenosis  . Depression  . GERD (gastroesophageal reflux disease)  . H/O Bell's palsy  Right sided, 2007  . Hx of adenomatous colonic polyps 08/15/2015  . Hyperlipidemia  . Migraines  . Peripheral vascular disease (CMS-HCC)  . Sleep apnea   Past Surgical History He has a past surgical history that includes Cholecystectomy; Tonsillectomy; Colonoscopy (08/17/2012); and Colonoscopy.   Medications and Allergies  Current Medications  Current Outpatient Medications  Medication Sig Dispense Refill  . albuterol 90 mcg/actuation inhaler INHALE 2 PUFFS BY MOUTH EVERY 6 HOURS AS NEEDED FOR WHEEZE 11  . aspirin 325 MG EC tablet Take 81 mg by mouth once daily.  Marland Kitchen atorvastatin (LIPITOR) 80 MG tablet Take 1 tablet (80 mg total) by mouth once daily 90 tablet 4  . budesonide (PULMICORT) 0.5 mg/2 mL nebulizer solution Take 0.5 mg by nebulization once daily  . formoterol (PERFOROMIST) 20 mcg/2 mL nebulizer solution Take 20 mcg by nebulization 2 (two) times daily  . gabapentin (NEURONTIN) 100 MG capsule TAKE 1 CAPSULE (100 MG TOTAL) BY MOUTH EVERY MORNING 90 capsule 1  . levothyroxine (SYNTHROID) 25 MCG tablet Take 1 tablet (25 mcg total) by mouth once daily Take on an empty stomach with a glass of water at least 30-60 minutes before breakfast. 30 tablet 11  . loratadine (CLARITIN) 10 mg tablet Take 1 tablet (10 mg total) by mouth once daily 90 tablet 1  . multivitamin tablet Take 1 tablet by mouth once daily  . tiotropium bromide (SPIRIVA RESPIMAT) 2.5 mcg/actuation Mist Inhale into the lungs   No current  facility-administered medications for this visit.   Allergies: Ciprocinonide and Fluocinolone  Social and Family History  Social History reports that he has quit smoking. He has never used smokeless  tobacco. Drug use questions deferred to the physician. He reports that he does not drink alcohol.  Family History Family History  Problem Relation Age of Onset  . Coronary Artery Disease (Blocked arteries around heart) Mother   Review of Systems   Review of Systems: The patient denies chest pain, shortness of breath, orthopnea, paroxysmal nocturnal dyspnea, pedal edema, palpitations, heart racing, fatigue, dizziness, lightheadedness, presyncope, syncope, leg pain, leg cramping. Review of 12 Systems is negative except as described in HPI.   Physical Examination   Vitals:BP 124/80  Pulse 60  Resp 16  Ht 167.6 cm (5\' 6" )  Wt 82.6 kg (182 lb)  SpO2 99%  BMI 29.38 kg/m  Ht:167.6 cm (5\' 6" ) Wt:82.6 kg (182 lb) BWI:OMBT surface area is 1.96 meters squared. Body mass index is 29.38 kg/m.  General: Well developed, well nourished. In no acute distress HEENT: Pupils equally reactive to light and accomodation  Neck: Supple without thyromegaly, or goiter. Carotid pulses 2+. No carotid bruits present.  Pulmonary: On supplemental O2. Clear to auscultation bilaterally; no wheezes, rales, rhonchi Cardiovascular: Regular rate and rhythm. No gallops, murmurs or rubs Gastrointestinal: Soft nontender, nondistended, with normal bowel sounds Extremities: No cyanosis, clubbing, or edema Peripheral Pulses: 2+ in upper extremities, 2+ in lower extremities  Neurology: Alert and oriented X3 Pysch: Good affect. Responds appropriately  Assessment and Plan   78 y.o. male with  1. Coronary artery disease involving native coronary artery of native heart without angina pectoris  -60% OM1 stenosis per cath in 2010; with newly reduced LV systolic function and global hypokinesis, will further assess with cardiac catheterization  -Benefits and risks, including bleeding, infection, stroke, MI, and death all discussed in thorough detail with the patient  -Will continue with aspirin and atorvastatin for now with  further medication plan pending results  2. Peripheral vascular disease (CMS-HCC)  -Continue aspirin, atorvastatin  3. Aortic atherosclerosis (CMS-HCC)  -Continue aspirin, atorvastatin  4. Other emphysema (CMS-HCC)  -Oxygen dependent; continue current medication regimen and routine follow up with pulmonology  5. Obstructive sleep apnea syndrome  -Continue nightly compliance with CPAP  6. Mixed hyperlipidemia  -Continue atorvastatin 80mg  with an LDL goal <70; most recent calculated at 37  7. Syncope, unspecified syncope type -Etiology unclear; with progressive exertional shortness of breath, syncopal episode, and newly reduced LV systolic function, will further assess with cardiac catheterization  -Instructed to refrain from driving     Orders Placed This Encounter  Procedures  . CBC w/auto Differential (5 Part)  . Basic Metabolic Panel (BMP)   Return after cath.    Chris Docker  MD  Pt seen and examined. No change from above

## 2019-07-30 ENCOUNTER — Ambulatory Visit: Payer: Medicare Other | Admitting: Dermatology

## 2019-08-02 ENCOUNTER — Other Ambulatory Visit: Payer: Self-pay | Admitting: Physical Medicine and Rehabilitation

## 2019-08-02 DIAGNOSIS — M5416 Radiculopathy, lumbar region: Secondary | ICD-10-CM

## 2019-08-20 ENCOUNTER — Ambulatory Visit: Payer: Medicare Other

## 2019-09-06 ENCOUNTER — Other Ambulatory Visit: Payer: Self-pay

## 2019-09-06 ENCOUNTER — Ambulatory Visit
Admission: RE | Admit: 2019-09-06 | Discharge: 2019-09-06 | Disposition: A | Discharge status: Home / Self Care | Payer: Medicare Other | Source: Ambulatory Visit | Attending: Physical Medicine and Rehabilitation | Admitting: Physical Medicine and Rehabilitation

## 2019-09-06 DIAGNOSIS — M5416 Radiculopathy, lumbar region: Secondary | ICD-10-CM | POA: Diagnosis present

## 2019-12-06 IMAGING — CR DG CHEST 2V
1 series · 2 of 2 positions shown · non-contrast
Comparison: 11/28/2016.

CLINICAL DATA: Severe emphysema.

EXAM:
CHEST - 2 VIEW

[Series 1: dg chest 2 view · 0.14mm/px · 2 of 2 slices shown]
[im 1/2]
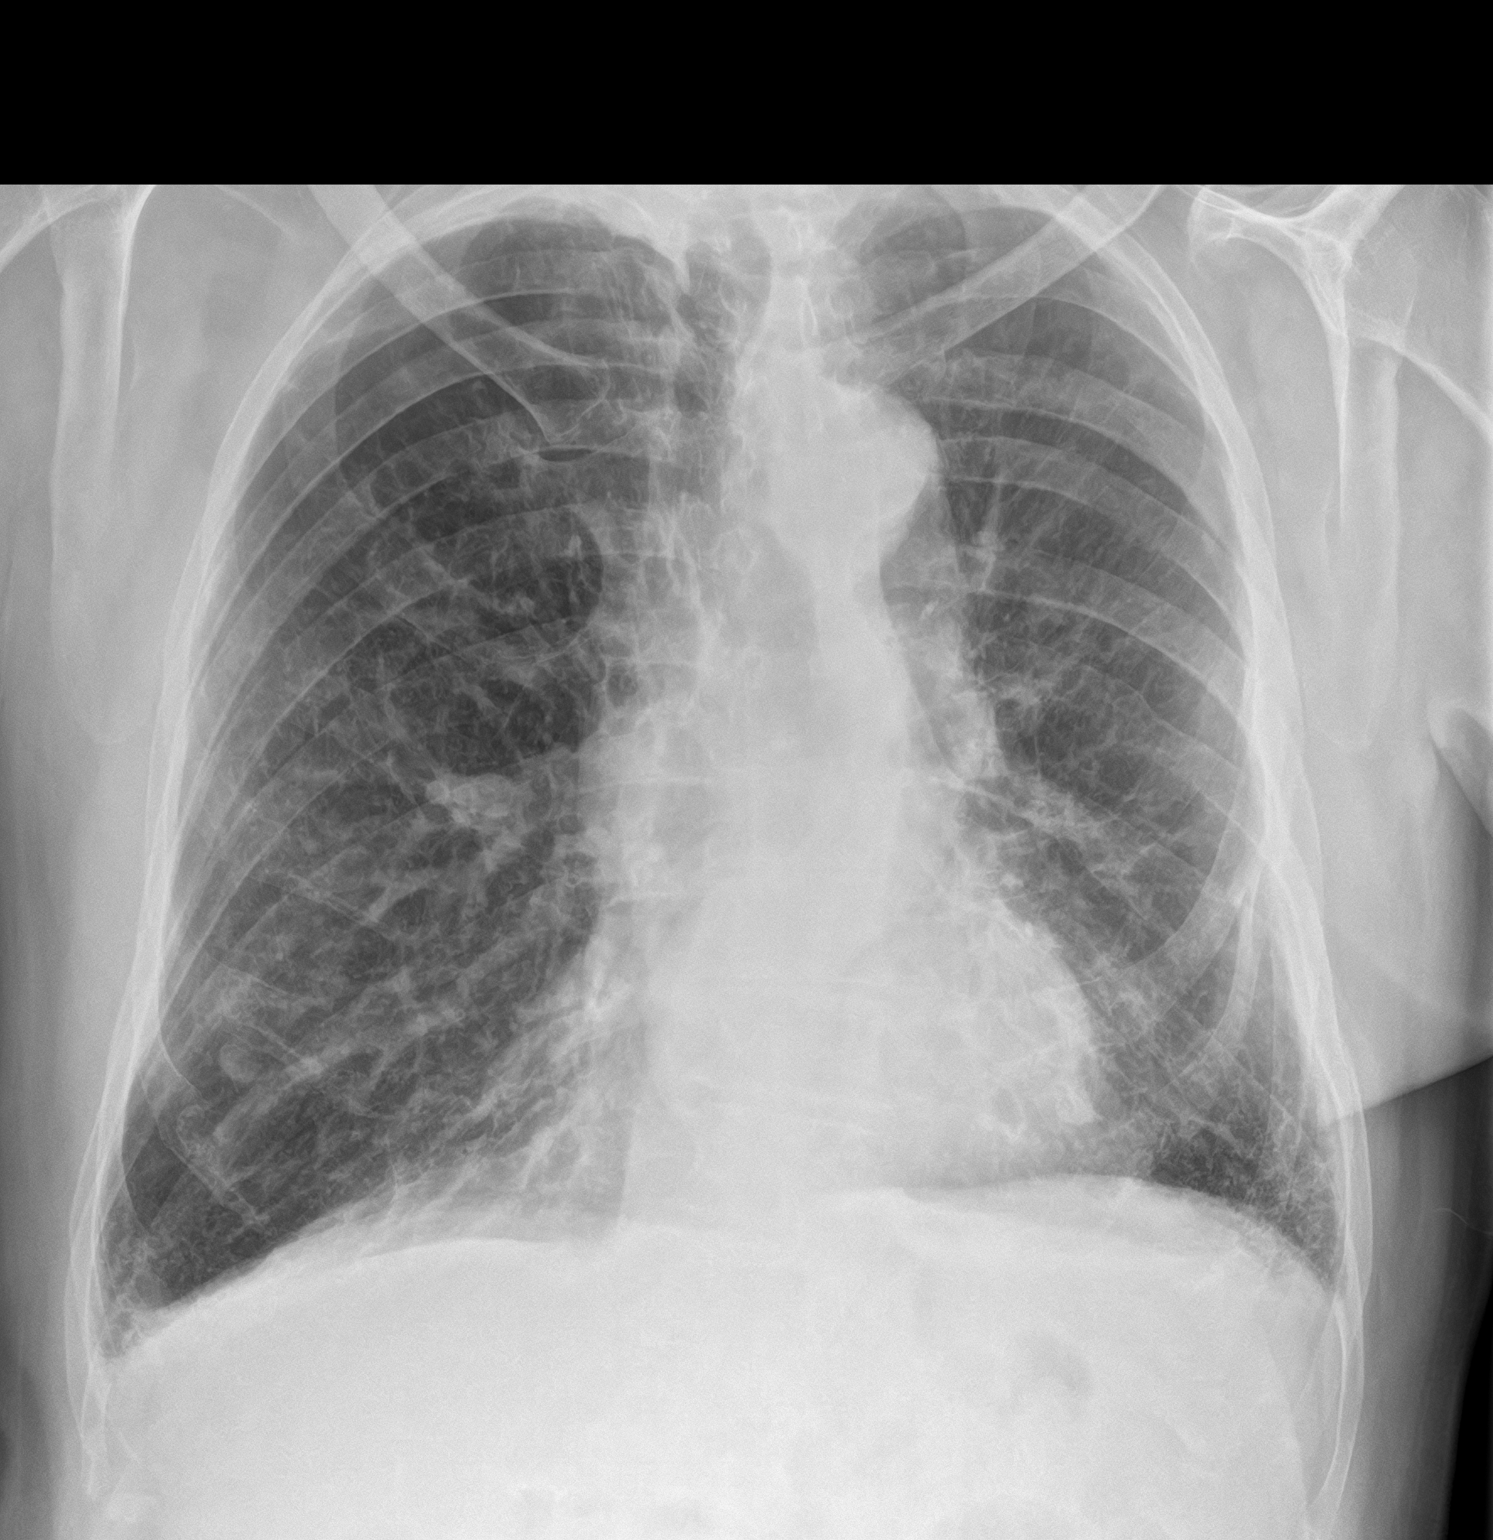
[im 2/2]
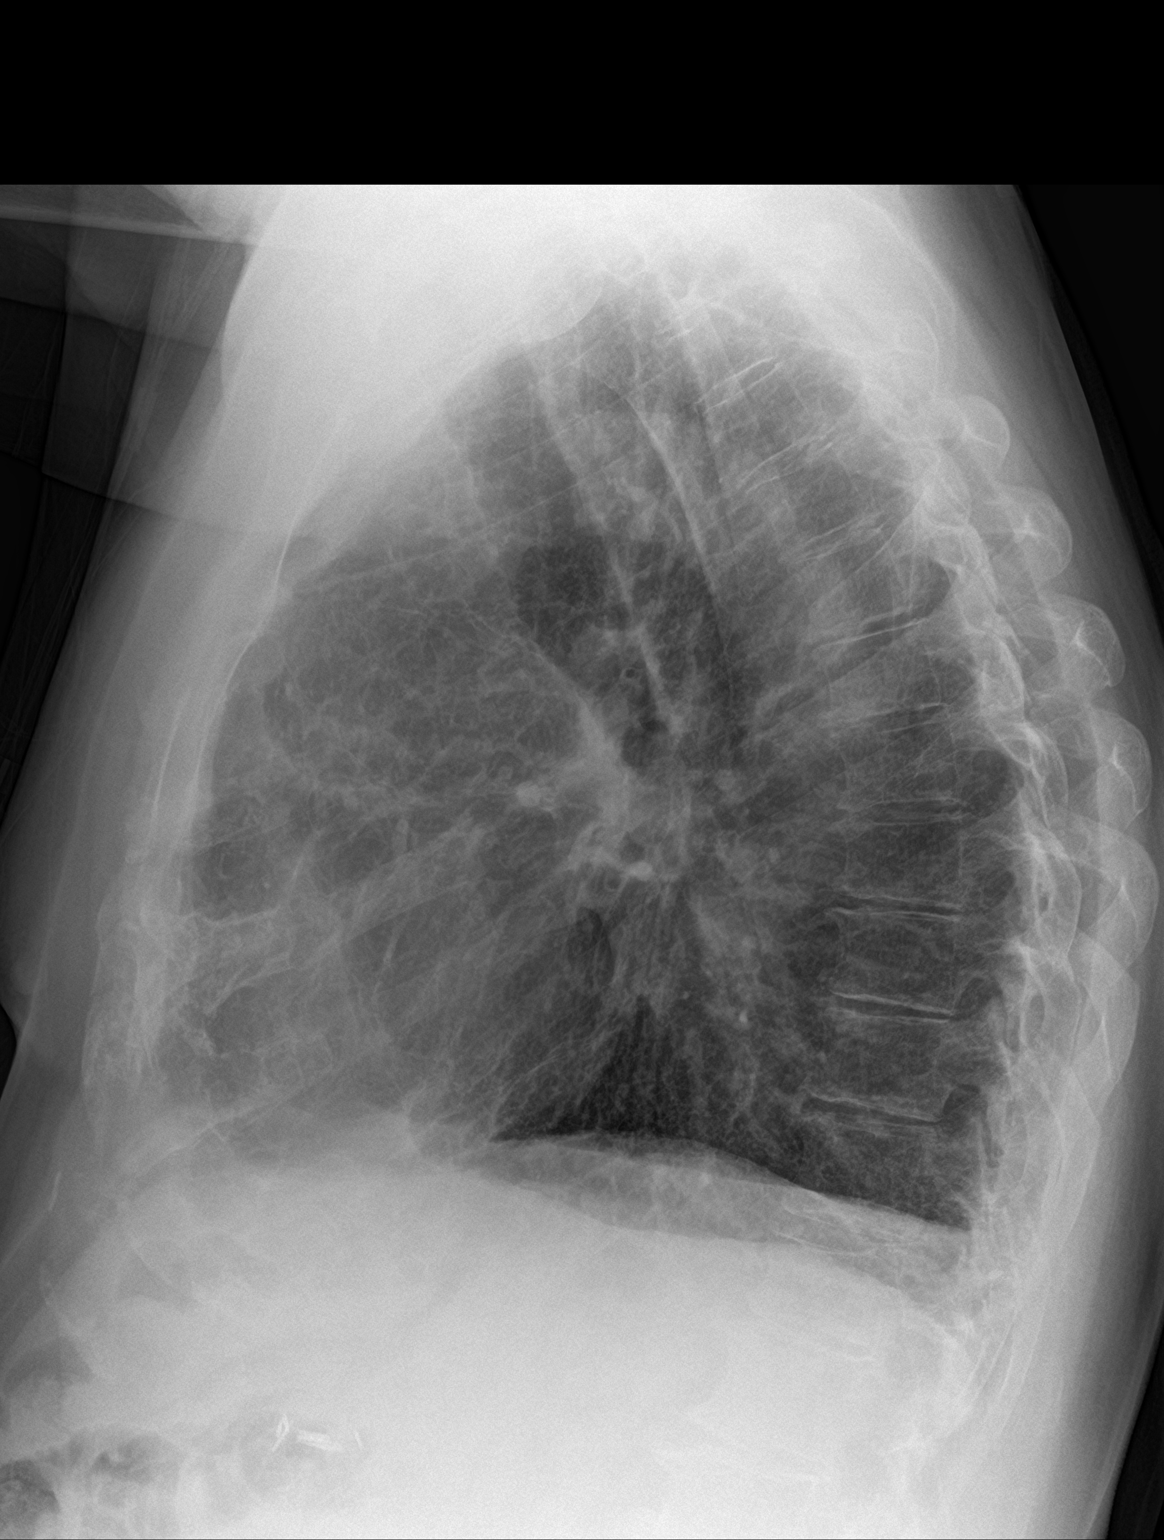

[2 of 2 positions shown; findings below may reference images not displayed]

FINDINGS: Mediastinum and hilar structures are normal. Heart size stable.
Pulmonary vascularity normal. COPD. Bilateral pleural-parenchymal
thickening consistent with scarring. Small nodular density noted
over the right lung base is unchanged consistent with nipple shadow.
Diffuse osteopenia degenerative change thoracic spine. Surgical
clips upper abdomen.
IMPRESSION: COPD. Bilateral pleural-parenchymal scarring. No acute
cardiopulmonary disease.

## 2020-03-18 ENCOUNTER — Ambulatory Visit: Payer: Medicare Other | Attending: Internal Medicine

## 2020-03-18 ENCOUNTER — Other Ambulatory Visit: Payer: Self-pay | Admitting: Internal Medicine

## 2020-03-18 DIAGNOSIS — Z23 Encounter for immunization: Secondary | ICD-10-CM

## 2020-03-18 NOTE — Progress Notes (Signed)
   Covid-19 Vaccination Clinic  Name:  Chris Lozano    MRN: [id number] DOB: 04/24/1940  03/18/2020  [name] was observed post Covid-19 immunization for 15 minutes without incident. He was provided with Vaccine Information Sheet and instruction to access the V-Safe system.   Mr. Fix was instructed to call 911 with any severe reactions post vaccine: Marland Kitchen Difficulty breathing  . Swelling of face and throat  . A fast heartbeat  . A bad rash all over body  . Dizziness and weakness   Immunizations Administered    Name Date Dose VIS Date Route   PFIZER Comrnaty(Gray TOP) Covid-19 Vaccine 03/18/2020 11:27 AM 0.3 mL 01/17/2020 Intramuscular   Manufacturer: [employer]   Lot: [id number]   NDC: [phone]

## 2020-08-26 ENCOUNTER — Ambulatory Visit: Payer: Medicare Other | Admitting: Dermatology

## 2020-08-26 ENCOUNTER — Other Ambulatory Visit: Payer: Self-pay

## 2020-08-26 DIAGNOSIS — L821 Other seborrheic keratosis: Secondary | ICD-10-CM

## 2020-08-26 DIAGNOSIS — L820 Inflamed seborrheic keratosis: Secondary | ICD-10-CM

## 2020-08-26 DIAGNOSIS — L578 Other skin changes due to chronic exposure to nonionizing radiation: Secondary | ICD-10-CM | POA: Diagnosis not present

## 2020-08-26 NOTE — Progress Notes (Signed)
   Follow-Up Visit   Subjective  Chris Lozano is a 80 y.o. male who presents for the following: Follow-up (Patient is here today with wife concerning a spot on forehead. His wife states she noticed that it is changing color and would like to have it checked. ). He has other areas to be checked today.  The following portions of the chart were reviewed this encounter and updated as appropriate:   Tobacco  Allergies  Meds  Problems  Med Hx  Surg Hx  Fam Hx     Review of Systems:  No other skin or systemic complaints except as noted in HPI or Assessment and Plan.  Objective  Well appearing patient in no apparent distress; mood and affect are within normal limits.  A focused examination was performed including forehead. Relevant physical exam findings are noted in the Assessment and Plan.  right temple x 2  Erythematous keratotic or waxy stuck-on papule or plaque.    Assessment & Plan  Inflamed seborrheic keratosis right temple x 2  Prior to procedure, discussed risks of blister formation, small wound, skin dyspigmentation, or rare scar following cryotherapy. Recommend Vaseline ointment to treated areas while healing.  Destruction of lesion - right temple x 2 Complexity: simple   Destruction method: cryotherapy   Informed consent: discussed and consent obtained   Timeout:  patient name, date of birth, surgical site, and procedure verified Lesion destroyed using liquid nitrogen: Yes   Region frozen until ice ball extended beyond lesion: Yes   Outcome: patient tolerated procedure well with no complications   Post-procedure details: wound care instructions given    Seborrheic Keratoses - Stuck-on, waxy, tan-brown papules and/or plaques  - Benign-appearing - Discussed benign etiology and prognosis. - Observe - Call for any changes  Actinic Damage - chronic, secondary to cumulative UV radiation exposure/sun exposure over time - diffuse scaly erythematous macules with  underlying dyspigmentation - Recommend daily broad spectrum sunscreen SPF 30+ to sun-exposed areas, reapply every 2 hours as needed.  - Recommend staying in the shade or wearing long sleeves, sun glasses (UVA+UVB protection) and wide brim hats (4-inch brim around the entire circumference of the hat). - Call for new or changing lesions.  Return if symptoms worsen or fail to improve, for call back in 6 weeks .  IRuthell Rummage, CMA, am acting as scribe for Sarina Ser, MD. Documentation: I have reviewed the above documentation for accuracy and completeness, and I agree with the above.  Sarina Ser, MD

## 2020-08-26 NOTE — Patient Instructions (Signed)
Prior to procedure, discussed risks of blister formation, small wound, skin dyspigmentation, or rare scar following cryotherapy. Recommend Vaseline ointment to treated areas while healing.  If you have any questions or concerns for your doctor, please call our main line at [phone] and press option 4 to reach your doctor's medical assistant. If no one answers, please leave a voicemail as directed and we will return your call as soon as possible. Messages left after 4 pm will be answered the following business day.   You may also send [place] a message via [place]. We typically respond to MyChart messages within 1-2 business days.  For prescription refills, please ask your pharmacy to contact our office. Our fax number is [phone].  If you have an urgent issue when the clinic is closed that cannot wait until the next business day, you can page your doctor at the number below.    Please note that while we do our best to be available for urgent issues outside of office hours, we are not available 24/7.   If you have an urgent issue and are unable to reach [place], you may choose to seek medical care at your doctor's office, retail clinic, urgent care center, or emergency room.  If you have a medical emergency, please immediately call 911 or go to the emergency department.  Pager Numbers  - Dr. Nehemiah Massed: [phone]  - Dr. Laurence Ferrari: [phone]  - Dr. Nicole Kindred: [phone]  In the event of inclement weather, please call our main line at [phone] for an update on the status of any delays or closures.  Dermatology Medication Tips: Please keep the boxes that topical medications come in in order to help keep track of the instructions about where and how to use these. Pharmacies typically print the medication instructions only on the boxes and not directly on the medication tubes.   If your medication is too expensive, please contact our office at [phone] option 4 or send [place] a message  through [place].   We are unable to tell what your co-pay for medications will be in advance as this is different depending on your insurance coverage. However, we may be able to find a substitute medication at lower cost or fill out paperwork to get insurance to cover a needed medication.   If a prior authorization is required to get your medication covered by your insurance company, please allow [place] 1-2 business days to complete this process.  Drug prices often vary depending on where the prescription is filled and some pharmacies may offer cheaper prices.  The website www.goodrx.com contains coupons for medications through different pharmacies. The prices here do not account for what the cost may be with help from insurance (it may be cheaper with your insurance), but the website can give you the price if you did not use any insurance.  - You can print the associated coupon and take it with your prescription to the pharmacy.  - You may also stop by our office during regular business hours and pick up a GoodRx coupon card.  - If you need your prescription sent electronically to a different pharmacy, notify our office through [facility] or by phone at [phone] option 4.

## 2020-08-27 ENCOUNTER — Encounter: Payer: Self-pay | Admitting: Dermatology

## 2020-10-27 ENCOUNTER — Emergency Department: Payer: Medicare Other

## 2020-10-27 ENCOUNTER — Inpatient Hospital Stay
Admission: EM | Admit: 2020-10-27 | Discharge: 2020-10-30 | DRG: 193 | Disposition: A | Discharge status: Home / Self Care | Payer: Medicare Other | Attending: Internal Medicine | Admitting: Internal Medicine

## 2020-10-27 ENCOUNTER — Other Ambulatory Visit: Payer: Self-pay

## 2020-10-27 DIAGNOSIS — R634 Abnormal weight loss: Secondary | ICD-10-CM | POA: Diagnosis present

## 2020-10-27 DIAGNOSIS — J9602 Acute respiratory failure with hypercapnia: Secondary | ICD-10-CM | POA: Diagnosis not present

## 2020-10-27 DIAGNOSIS — R918 Other nonspecific abnormal finding of lung field: Secondary | ICD-10-CM | POA: Diagnosis present

## 2020-10-27 DIAGNOSIS — Z79899 Other long term (current) drug therapy: Secondary | ICD-10-CM | POA: Diagnosis not present

## 2020-10-27 DIAGNOSIS — Z79890 Hormone replacement therapy (postmenopausal): Secondary | ICD-10-CM

## 2020-10-27 DIAGNOSIS — I700 Atherosclerosis of aorta: Secondary | ICD-10-CM | POA: Diagnosis present

## 2020-10-27 DIAGNOSIS — R2681 Unsteadiness on feet: Secondary | ICD-10-CM | POA: Diagnosis present

## 2020-10-27 DIAGNOSIS — Z888 Allergy status to other drugs, medicaments and biological substances status: Secondary | ICD-10-CM

## 2020-10-27 DIAGNOSIS — F32A Depression, unspecified: Secondary | ICD-10-CM | POA: Diagnosis present

## 2020-10-27 DIAGNOSIS — R42 Dizziness and giddiness: Secondary | ICD-10-CM | POA: Diagnosis present

## 2020-10-27 DIAGNOSIS — Z7982 Long term (current) use of aspirin: Secondary | ICD-10-CM

## 2020-10-27 DIAGNOSIS — E785 Hyperlipidemia, unspecified: Secondary | ICD-10-CM | POA: Diagnosis present

## 2020-10-27 DIAGNOSIS — Z66 Do not resuscitate: Secondary | ICD-10-CM | POA: Diagnosis present

## 2020-10-27 DIAGNOSIS — J984 Other disorders of lung: Secondary | ICD-10-CM

## 2020-10-27 DIAGNOSIS — Z87891 Personal history of nicotine dependence: Secondary | ICD-10-CM

## 2020-10-27 DIAGNOSIS — E039 Hypothyroidism, unspecified: Secondary | ICD-10-CM | POA: Diagnosis present

## 2020-10-27 DIAGNOSIS — I2510 Atherosclerotic heart disease of native coronary artery without angina pectoris: Secondary | ICD-10-CM | POA: Diagnosis present

## 2020-10-27 DIAGNOSIS — J9621 Acute and chronic respiratory failure with hypoxia: Secondary | ICD-10-CM | POA: Diagnosis present

## 2020-10-27 DIAGNOSIS — K219 Gastro-esophageal reflux disease without esophagitis: Secondary | ICD-10-CM | POA: Diagnosis present

## 2020-10-27 DIAGNOSIS — I739 Peripheral vascular disease, unspecified: Secondary | ICD-10-CM | POA: Diagnosis present

## 2020-10-27 DIAGNOSIS — K5900 Constipation, unspecified: Secondary | ICD-10-CM | POA: Diagnosis present

## 2020-10-27 DIAGNOSIS — F1020 Alcohol dependence, uncomplicated: Secondary | ICD-10-CM | POA: Diagnosis present

## 2020-10-27 DIAGNOSIS — F419 Anxiety disorder, unspecified: Secondary | ICD-10-CM | POA: Diagnosis present

## 2020-10-27 DIAGNOSIS — N400 Enlarged prostate without lower urinary tract symptoms: Secondary | ICD-10-CM | POA: Diagnosis present

## 2020-10-27 DIAGNOSIS — Z6821 Body mass index (BMI) 21.0-21.9, adult: Secondary | ICD-10-CM | POA: Diagnosis not present

## 2020-10-27 DIAGNOSIS — C3492 Malignant neoplasm of unspecified part of left bronchus or lung: Secondary | ICD-10-CM

## 2020-10-27 DIAGNOSIS — Z20822 Contact with and (suspected) exposure to covid-19: Secondary | ICD-10-CM | POA: Diagnosis present

## 2020-10-27 DIAGNOSIS — J439 Emphysema, unspecified: Secondary | ICD-10-CM | POA: Diagnosis present

## 2020-10-27 DIAGNOSIS — Z9981 Dependence on supplemental oxygen: Secondary | ICD-10-CM | POA: Diagnosis not present

## 2020-10-27 DIAGNOSIS — Z7951 Long term (current) use of inhaled steroids: Secondary | ICD-10-CM | POA: Diagnosis not present

## 2020-10-27 DIAGNOSIS — J9601 Acute respiratory failure with hypoxia: Secondary | ICD-10-CM | POA: Diagnosis not present

## 2020-10-27 DIAGNOSIS — R630 Anorexia: Secondary | ICD-10-CM | POA: Diagnosis present

## 2020-10-27 DIAGNOSIS — G4733 Obstructive sleep apnea (adult) (pediatric): Secondary | ICD-10-CM | POA: Diagnosis present

## 2020-10-27 DIAGNOSIS — J189 Pneumonia, unspecified organism: Principal | ICD-10-CM | POA: Diagnosis present

## 2020-10-27 DIAGNOSIS — J441 Chronic obstructive pulmonary disease with (acute) exacerbation: Secondary | ICD-10-CM | POA: Diagnosis not present

## 2020-10-27 LAB — BASIC METABOLIC PANEL
Anion gap: 9 (ref 5–15)
BUN: 14 mg/dL (ref 8–23)
CO2: 31 mmol/L (ref 22–32)
Calcium: 8.3 mg/dL — ABNORMAL LOW (ref 8.9–10.3)
Chloride: 99 mmol/L (ref 98–111)
Creatinine, Ser: 1.03 mg/dL (ref 0.61–1.24)
GFR, Estimated: >60 mL/min (ref >60)
Glucose, Bld: 128 mg/dL — ABNORMAL HIGH (ref 70–99)
Potassium: 5 mmol/L (ref 3.5–5.1)
Sodium: 139 mmol/L (ref 135–145)

## 2020-10-27 LAB — LACTIC ACID, PLASMA: Lactic Acid, Venous: 0.8 mmol/L (ref 0.5–1.9)

## 2020-10-27 LAB — RESP PANEL BY RT-PCR (FLU A&B, COVID) ARPGX2
Influenza A by PCR: NEGATIVE
Influenza B by PCR: NEGATIVE
SARS Coronavirus 2 by RT PCR: NEGATIVE

## 2020-10-27 LAB — CBC
HCT: 46.7 % (ref 39.0–52.0)
Hemoglobin: 15.1 g/dL (ref 13.0–17.0)
MCH: 31.6 pg (ref 26.0–34.0)
MCHC: 32.3 g/dL (ref 30.0–36.0)
MCV: 97.7 fL (ref 80.0–100.0)
Platelets: 147 10*3/uL — ABNORMAL LOW (ref 150–400)
RBC: 4.78 MIL/uL (ref 4.22–5.81)
RDW: 13.9 % (ref 11.5–15.5)
WBC: 14.3 10*3/uL — ABNORMAL HIGH (ref 4.0–10.5)
nRBC: 0 % (ref 0.0–0.2)

## 2020-10-27 MED ORDER — THIAMINE HCL 100 MG/ML IJ SOLN: 100.0000 mg | Freq: Every day | INTRAMUSCULAR | Status: DC

## 2020-10-27 MED ORDER — LORAZEPAM 1 MG PO TABS
1.0000 mg | ORAL_TABLET | ORAL | Status: DC | PRN
Start: 2020-10-27 — End: 2020-10-30

## 2020-10-27 MED ORDER — POLYETHYLENE GLYCOL 3350 17 G PO PACK: 17.0000 g | PACK | Freq: Every day | ORAL | Status: DC | PRN

## 2020-10-27 MED ORDER — SODIUM CHLORIDE 0.9 % IV SOLN
2.0000 g | INTRAVENOUS | Status: DC
  Administered 2020-10-28: 2 g via INTRAVENOUS
  Filled 2020-10-27 (×2): qty 20

## 2020-10-27 MED ORDER — SODIUM CHLORIDE 0.9 % IV SOLN
1.0000 g | Freq: Once | INTRAVENOUS | Status: DC
  Administered 2020-10-27: 1 g via INTRAVENOUS
  Filled 2020-10-27: qty 10

## 2020-10-27 MED ORDER — SODIUM CHLORIDE 0.9 % IV SOLN
500.0000 mg | Freq: Once | INTRAVENOUS | Status: AC
  Administered 2020-10-27: 500 mg via INTRAVENOUS
  Filled 2020-10-27: qty 500

## 2020-10-27 MED ORDER — GUAIFENESIN ER 600 MG PO TB12
1200.0000 mg | ORAL_TABLET | Freq: Two times a day (BID) | ORAL | Status: AC
  Administered 2020-10-27 – 2020-10-29 (×4): 1200 mg via ORAL
  Filled 2020-10-27 (×5): qty 2

## 2020-10-27 MED ORDER — LORAZEPAM 2 MG/ML IJ SOLN: 1.0000 mg | INTRAMUSCULAR | Status: DC | PRN

## 2020-10-27 MED ORDER — SODIUM CHLORIDE 3 % IN NEBU
4.0000 mL | INHALATION_SOLUTION | Freq: Two times a day (BID) | RESPIRATORY_TRACT | Status: AC
  Administered 2020-10-27 – 2020-10-29 (×4): 4 mL via RESPIRATORY_TRACT
  Filled 2020-10-27 (×4): qty 4

## 2020-10-27 MED ORDER — SODIUM CHLORIDE 0.9 % IV SOLN
500.0000 mg | INTRAVENOUS | Status: DC
  Administered 2020-10-28: 500 mg via INTRAVENOUS
  Filled 2020-10-27 (×2): qty 500

## 2020-10-27 MED ORDER — ATORVASTATIN CALCIUM 20 MG PO TABS
80.0000 mg | ORAL_TABLET | Freq: Every day | ORAL | Status: DC
  Administered 2020-10-28 – 2020-10-30 (×3): 80 mg via ORAL
  Filled 2020-10-27 (×3): qty 4

## 2020-10-27 MED ORDER — ONDANSETRON HCL 4 MG/2ML IJ SOLN: 4.0000 mg | Freq: Four times a day (QID) | INTRAMUSCULAR | Status: DC | PRN

## 2020-10-27 MED ORDER — IOHEXOL 350 MG/ML SOLN
75.0000 mL | Freq: Once | INTRAVENOUS | Status: AC | PRN
  Administered 2020-10-27: 75 mL via INTRAVENOUS

## 2020-10-27 MED ORDER — ADULT MULTIVITAMIN W/MINERALS CH
1.0000 | ORAL_TABLET | Freq: Every day | ORAL | Status: DC
  Administered 2020-10-28 – 2020-10-30 (×3): 1 via ORAL
  Filled 2020-10-27 (×3): qty 1

## 2020-10-27 MED ORDER — ARFORMOTEROL TARTRATE 15 MCG/2ML IN NEBU
15.0000 ug | INHALATION_SOLUTION | Freq: Two times a day (BID) | RESPIRATORY_TRACT | Status: DC
  Administered 2020-10-27 – 2020-10-30 (×6): 15 ug via RESPIRATORY_TRACT
  Filled 2020-10-27 (×7): qty 2

## 2020-10-27 MED ORDER — TIOTROPIUM BROMIDE MONOHYDRATE 18 MCG IN CAPS
1.0000 | ORAL_CAPSULE | Freq: Every day | RESPIRATORY_TRACT | Status: DC
  Administered 2020-10-28 – 2020-10-30 (×3): 18 ug via RESPIRATORY_TRACT
  Filled 2020-10-27 (×2): qty 5

## 2020-10-27 MED ORDER — ENOXAPARIN SODIUM 40 MG/0.4ML IJ SOSY
40.0000 mg | PREFILLED_SYRINGE | Freq: Every day | INTRAMUSCULAR | Status: DC
  Administered 2020-10-27 – 2020-10-29 (×3): 40 mg via SUBCUTANEOUS
  Filled 2020-10-27 (×3): qty 0.4

## 2020-10-27 MED ORDER — MELATONIN 5 MG PO TABS
2.5000 mg | ORAL_TABLET | Freq: Every evening | ORAL | Status: DC | PRN
  Administered 2020-10-28 – 2020-10-29 (×2): 2.5 mg via ORAL
  Filled 2020-10-27 (×3): qty 0.5

## 2020-10-27 MED ORDER — THIAMINE HCL 100 MG PO TABS
100.0000 mg | ORAL_TABLET | Freq: Every day | ORAL | Status: DC
  Administered 2020-10-28 – 2020-10-30 (×3): 100 mg via ORAL
  Filled 2020-10-27 (×3): qty 1

## 2020-10-27 MED ORDER — LACTATED RINGERS IV BOLUS
1000.0000 mL | Freq: Once | INTRAVENOUS | Status: AC
  Administered 2020-10-27: 1000 mL via INTRAVENOUS

## 2020-10-27 MED ORDER — SODIUM CHLORIDE 0.9 % IV BOLUS
500.0000 mL | Freq: Once | INTRAVENOUS | Status: AC
  Administered 2020-10-27: 500 mL via INTRAVENOUS

## 2020-10-27 MED ORDER — LEVOTHYROXINE SODIUM 25 MCG PO TABS
25.0000 ug | ORAL_TABLET | Freq: Every day | ORAL | Status: DC
  Administered 2020-10-28 – 2020-10-30 (×3): 25 ug via ORAL
  Filled 2020-10-27 (×2): qty 1
  Filled 2020-10-27: qty 0.5

## 2020-10-27 MED ORDER — ENSURE ENLIVE PO LIQD
237.0000 mL | Freq: Two times a day (BID) | ORAL | Status: DC
  Administered 2020-10-28 – 2020-10-30 (×5): 237 mL via ORAL

## 2020-10-27 MED ORDER — ACETAMINOPHEN 325 MG PO TABS: 650.0000 mg | ORAL_TABLET | Freq: Four times a day (QID) | ORAL | Status: DC | PRN

## 2020-10-27 MED ORDER — FOLIC ACID 1 MG PO TABS
1.0000 mg | ORAL_TABLET | Freq: Every day | ORAL | Status: DC
  Administered 2020-10-28 – 2020-10-30 (×3): 1 mg via ORAL
  Filled 2020-10-27 (×3): qty 1

## 2020-10-27 NOTE — H&P (Addendum)
History and Physical  Chris Lozano STM:[id number] DOB: 14-Apr-1940 DOA: 10/27/2020  Referring physician: Dr. Archie Balboa, [place]. PCP: Adin Hector, MD  Outpatient Specialists: Cardiology Patient coming from: Home.  Chief Complaint: Dizziness  HPI: Chris Lozano is a 80 y.o. male with medical history significant for coronary artery disease, PVD, aortic atherosclerosis, COPD/emphysema, chronic hypoxic respiratory failure on 3 L continuously at baseline, hyperlipidemia, chronic anxiety/depression, iron deficiency anemia, chronic thrombocytopenia, hypothyroidism, BPH, who presented to [facility] ED at his PCPs recommendation due to recurrent dizziness and unsteadiness on his feet x 2 weeks.  His dizziness mainly occurs in the morning.  No falls.  Denies nausea or vomiting.  No abdominal pain.  No chest pain.  Admits to alcohol use with 1-2 beers per month.  Endorses unintentional weight loss (he is unsure of how many lbs and at which interval of time), dyspnea with minimal exertion which he attributes to his COPD, poor appetite, and intermittent productive cough with yellow sputum.  No hemoptysis.  Upon presentation to the ED, work-up reveals new left lower lobe lung mass measuring more than 5 cm, with suspected postobstructive pneumonia.  Started on IV antibiotics empirically to cover for CAP in the ED.  Oxygen supplementation increased to 4 L to maintain O2 saturation greater than 90%.  EDP requested admission due to concern for malignancy and postobstructive pneumonia.  Admitted to [facility], hospitalist service.  ED Course:  Temperature 98.9.  BP 95/74, pulse 63, respiration rate 22, O2 saturation 100% on 4 L.  Lab studies remarkable for serum potassium 5.0, sodium 139, serum bicarb 31, glucose 128, creatinine 1.03 with GFR greater than 60.  Lactic acid 0.8.  WBC 14.3.  Review of Systems: Review of systems as noted in the HPI. All other systems reviewed and are negative.   Past Medical History:  Diagnosis Date    Asthma    BPH (benign prostatic hyperplasia)    CAD (coronary artery disease)    COPD (chronic obstructive pulmonary disease) (HCC)    GERD (gastroesophageal reflux disease)    HLD (hyperlipidemia)    Sleep apnea    Past Surgical History:  Procedure Laterality Date   ADENOIDECTOMY     CARDIAC CATHETERIZATION     GALLBLADDER SURGERY     LEFT HEART CATH AND CORONARY ANGIOGRAPHY Left 01/10/2019   Procedure: LEFT HEART CATH AND CORONARY ANGIOGRAPHY;  Surgeon: Teodoro Spray, MD;  Location: [place] [facility];  Service: Cardiovascular;  Laterality: Left;   TONSILLECTOMY      Social History:  reports that he has quit smoking. He has quit using smokeless tobacco.  His smokeless tobacco use included chew. He reports current alcohol use. He reports that he does not use drugs.   Allergies  Allergen Reactions   Fluocinolone Other (See Comments)    Reaction: dizzines, "drunk" Other reaction(s): Unknown   Ciprofloxacin Hcl     Dizzy     Family History  Problem Relation Age of Onset   Heart disease Other    Kidney disease Neg Hx    Prostate cancer Neg Hx    Kidney cancer Neg Hx    Bladder Cancer Neg Hx       Prior to Admission medications   Medication Sig Start Date End Date Taking? Authorizing Provider  albuterol (PROVENTIL HFA;VENTOLIN HFA) 108 (90 Base) MCG/ACT inhaler Inhale 2 puffs into the lungs every 6 (six) hours as needed for wheezing or shortness of breath.  12/12/13   [provider]  albuterol (PROVENTIL) (2.5 MG/3ML) 0.083% nebulizer solution Take 2.5 mg by nebulization every 6 (six) hours as needed for wheezing or shortness of breath.    [provider]  aspirin EC 81 MG tablet Take 81 mg by mouth daily.    [provider]  atorvastatin (LIPITOR) 80 MG tablet Take 80 mg by mouth daily.    [provider]  budesonide (PULMICORT) 0.5 MG/2ML nebulizer solution Take 0.5 mg by nebulization 2 (two) times daily.    [provider]  Cholecalciferol (VITAMIN D3 PO) Take 1 tablet by mouth daily.    [provider]  COVID-19 mRNA Vac-TriS, Pfizer, SUSP injection USE AS DIRECTED 03/18/20 03/18/21  Carlyle Basques, MD  formoterol (PERFOROMIST) 20 MCG/2ML nebulizer solution Inhale 20 mcg into the lungs 2 (two) times daily.  11/16/17   [provider]  gabapentin (NEURONTIN) 100 MG capsule Take 100 mg by mouth daily.  12/21/17 12/28/19  [provider]  levothyroxine (SYNTHROID) 25 MCG tablet Take 25 mcg by mouth daily before breakfast. 12/15/18   [provider]  loratadine (CLARITIN) 10 MG tablet Take 10 mg by mouth daily. 12/15/18   [provider]  Multiple Vitamins-Minerals (ZINC PO) Take 1 tablet by mouth daily.    [provider]  Tiotropium Bromide Monohydrate 2.5 MCG/ACT AERS Inhale 2 puffs into the lungs daily.    [provider]    Physical Exam: BP 109/90   Pulse 68   Temp 98.9 F (37.2 C) (Oral)   Resp (!) 22   Ht 6' (1.829 m)   Wt 73.5 kg   SpO2 96%   BMI 21.97 kg/m   General: 80 y.o. year-old male well developed well nourished in no acute distress.  Alert and oriented x3. Cardiovascular: Regular rate and rhythm with no rubs or gallops.  No thyromegaly or JVD noted.  Trace lower extremity edema. 2/4 pulses in all 4 extremities. Respiratory: Mild rales at bases with no wheezing noted.  Poor inspiratory effort. Abdomen: Soft nontender mildly distended with normal bowel sounds x4 quadrants. Muskuloskeletal: No cyanosis or clubbing.  Trace edema noted bilaterally Neuro: CN II-XII intact, strength, sensation, reflexes Skin: No ulcerative lesions noted or rashes Psychiatry: Judgement and insight appear normal. Mood is appropriate for condition and setting          Labs on Admission:  Basic Metabolic Panel: Recent Labs  Lab 10/27/20 1500  NA 139  K 5.0  CL 99  CO2 31  GLUCOSE 128*  BUN 14  CREATININE 1.03  CALCIUM 8.3*   Liver Function  Tests: No results for input(s): AST, ALT, ALKPHOS, BILITOT, PROT, ALBUMIN in the last 168 hours. No results for input(s): LIPASE, AMYLASE in the last 168 hours. No results for input(s): AMMONIA in the last 168 hours. CBC: Recent Labs  Lab 10/27/20 1500  WBC 14.3*  HGB 15.1  HCT 46.7  MCV 97.7  PLT 147*   Cardiac Enzymes: No results for input(s): CKTOTAL, CKMB, CKMBINDEX, TROPONINI in the last 168 hours.  BNP (last 3 results) No results for input(s): BNP in the last 8760 hours.  ProBNP (last 3 results) No results for input(s): PROBNP in the last 8760 hours.  CBG: No results for input(s): GLUCAP in the last 168 hours.  Radiological Exams on Admission: DG Chest 2 View  Result Date: 10/27/2020 CLINICAL DATA:  Increasing dizziness, elevated white blood cell count, hypoxia EXAM: CHEST - 2 VIEW COMPARISON:  10/27/17 FINDINGS: Cardiac shadow is within normal limits.  Aortic calcifications are noted. Right lung is well aerated with some mild peripheral scarring stable from the prior study. New predominately left lower lobe infiltrative density is noted as well as what appears to be a large left perihilar mass lesion in the superior segment of the left lower lobe. No bony abnormality is noted. IMPRESSION: Changes consistent with a left perihilar mass lesion in the left lower lobe with associated post obstructive infiltrate. CT of the chest with contrast is recommended for further evaluation. Electronically Signed   By: Inez Catalina M.D.   On: 10/27/2020 18:50   CT HEAD WO CONTRAST (5MM)  Result Date: 10/27/2020 CLINICAL DATA:  Increasing dizziness over the past few weeks EXAM: CT HEAD WITHOUT CONTRAST TECHNIQUE: Contiguous axial images were obtained from the base of the skull through the vertex without intravenous contrast. COMPARISON:  11/30/2018 FINDINGS: Brain: No evidence of acute infarction, hemorrhage, hydrocephalus, extra-axial collection or mass lesion/mass effect. Mild atrophic and  chronic white matter ischemic changes are noted. Vascular: No hyperdense vessel or unexpected calcification. Skull: Normal. Negative for fracture or focal lesion. Sinuses/Orbits: No acute finding. Other: None. IMPRESSION: Chronic atrophic and ischemic changes without acute abnormality. Electronically Signed   By: Inez Catalina M.D.   On: 10/27/2020 18:53   CT Chest W Contrast  Result Date: 10/27/2020 CLINICAL DATA:  Dizziness for the past few weeks. Mass versus pneumonia on radiograph. EXAM: CT CHEST WITH CONTRAST TECHNIQUE: Multidetector CT imaging of the chest was performed during intravenous contrast administration. CONTRAST:  29mL OMNIPAQUE IOHEXOL 350 MG/ML SOLN COMPARISON:  Chest radiograph earlier today. Prior chest radiographs reviewed. Most recent CT 01/14/2012 FINDINGS: Cardiovascular: Aortic atherosclerosis and tortuosity. No aortic aneurysm. There are coronary artery calcifications. The heart is normal in size. No pericardial effusion. No evidence of central pulmonary embolus, exam not tailored to pulmonary artery evaluation. Mediastinum/Nodes: Enlarged subcarinal nodes which appear confluent. For example, nodal conglomerate measuring 3 cm AP, series 2, image 75. 10 mm lower anterior paratracheal node, series 2, image 58. Additional subcentimeter mediastinal lymph nodes, including a 10 mm left paratracheal node, series 2, image 50. Low left hilar node measures 10 mm, series 2, image 72. Left lower lobe masslike consolidation extends to the hilum. No right hilar adenopathy. No supraclavicular adenopathy. The esophagus is difficult to delineate from adjacent adenopathy in the mid lower aspect. There may be esophageal wall thickening. Lungs/Pleura: Rounded masslike consolidation in the left lower lobe measures 5.9 x 4.9 x 5.8 cm, series 3, image 86. There is some central low-density but no definite necrosis or internal air. There is surrounding ill-defined opacity and ground-glass extending to the  pleural surface posteriorly and the fissure. Central extent towards the hilum in the left lower lobe bronchus. Patchy and ground-glass opacities distally in the left lower lobe, difficult to delineate airspace disease versus basilar honeycombing. There is moderate emphysema. Breathing motion artifact limits detailed assessment. Subpleural reticulation is noted in the mid lower lung zone on the right. The previous left upper lobe pulmonary nodules appear to represent vascular structures on the current exam. The previous tiny right pulmonary nodules are not convincingly seen. Trace left pleural thickening in the area of consolidation. Upper Abdomen: Motion artifact limits assessment. No visualized adrenal nodule. Cholecystectomy. Probable splenule at the splenic hilum. Musculoskeletal: Partial fusion of left lateral fifth and sixth ribs. This was also seen on prior exam. Definite focal osseous lesion, allowing for motion artifact limitations IMPRESSION: 1. Rounded masslike consolidation in the left lower lobe measuring 5.9 x  4.9 x 5.8 cm. There is surrounding ill-defined opacity and ground-glass extending to the pleural surface posteriorly and the fissure. Differential considerations include primary bronchogenic malignancy or pneumonia, given mediastinal adenopathy, malignancy is favored. Enlarged subcarinal with borderline mediastinal and left hilar lymph nodes. Consider referral to multi disciplinary thoracic conference. 2. Moderately advanced emphysema. Subpleural reticulation suggests an element of underlying interstitial lung disease. 3. Previous left upper lobe pulmonary nodules appear to represent vascular structures on the current exam. The tiny right lung nodules on remote prior CT are not confidently seen, with current motion artifact limitations. 4. Aortic atherosclerosis and coronary artery calcifications. Aortic Atherosclerosis (ICD10-I70.0) and Emphysema (ICD10-J43.9). Electronically Signed   By: Keith Rake M.D.   On: 10/27/2020 19:42    EKG: I independently viewed the EKG done and my findings are as followed: Sinus rhythm with first-degree AV block.  PACs, incomplete RBBB.  Nonspecific ST-T changes.  QTc 408.  Assessment/Plan Present on Admission: **None**  Active Problems:   Dizziness  Dizziness CT head showed chronic atrophic and ischemic changes without acute abnormality. Obtain orthostatic vital signs 10/28/2020 PT OT assessment Fall precautions UA pending  Newly diagnosed left lower lobe mass 5.9 x 4.9 x 5.8 cm seen on CT scan with concern for malignancy and postobstructive pneumonia. Leukocytosis WBC 14.3 K, left lower lobe infiltrates adjacent to left lower lobe mass Started on IV antibiotics empirically in the ED, Rocephin and IV azithromycin, day#1/5, continue Saline nebs twice daily x2 days, Mucinex 1200 mg twice daily x2 days Bronchodilators Obtain procalcitonin level in the morning Obtain sputum culture Strep pneumonia urine antigen, Legionella urine antigen Consult pulmonary in the morning for possible bronchoscopy with biopsies  Acute on chronic hypoxic respiratory failure in the setting of moderately advanced emphysema and new left lower lobe mass Resume home bronchodilators At baseline he is on 3 L nasal cannula continuously Currently on 4 L to maintain O2 saturation greater than 90%  Unintentional weight loss with poor oral intake, rule out malignancy/moderate protein calorie malnutrition. BMI 21 Moderate muscle mass loss Will obtain CMP in the morning for albumin level and LFTs, follow results Start diet as tolerated, encourage oral intake Oral supplement, Ensure  Coronary artery disease/PVD/aortic atherosclerosis/coronary artery calcifications No chest pain Hold off antiplatelet for now due to possible procedure Resume home statin  Hypothyroidism Resume home levothyroxine  BPH Resume home tamsulosin  Alcohol use disorder Self reported 1-2  beers per month No evidence of acute withdrawal on exam CIWA protocol   DVT prophylaxis: Subcu Lovenox daily  Code Status: DNR, stated by the patient himself who is alert and oriented x4.  Family Communication: Wife and daughter at bedside.  Disposition Plan: Admit to MedSurg unit with remote telemetry.  Consults called: Pulmonary consulted via epic  Admission status: Inpatient status.  Patient will require at least 2 midnights for further evaluation and treatment of present condition.   Status is: Inpatient    Dispo:  Patient From:  Home  Planned Disposition:  Home  Medically stable for discharge:  No          Kayleen Memos MD [facility] Pager [phone]  If 7PM-7AM, please contact night-coverage [email or web address] Password [email or web address]  10/27/2020, 9:01 PM

## 2020-10-27 NOTE — ED Triage Notes (Signed)
Pt to ED from [facility] for dizziness for the past few weeks. Denies falls. Denies pain.  Alert and oriented

## 2020-10-27 NOTE — ED Notes (Signed)
This RN attempted iv access x2. Labs obtained. Unsuccessful iv

## 2020-10-27 NOTE — ED Provider Notes (Signed)
[facility] Emergency Department Provider Note  ____________________________________________   I have reviewed the triage vital signs and the nursing notes.   HISTORY  Chief Complaint Dizziness   History limited by: Not Limited   HPI IVERSON SEES is a 80 y.o. male who presents to the emergency department today because of concern for feeling unsteady. He describes it as feeling drunk. It is apparent when he is trying to walk. He says it started roughly 2 weeks ago. Did wax and wane. The patient denies any headache, chest pain or shortness of breath. Denies similar symptoms in the past. Wife states she has also noticed patient has had decreased appetite and weight loss recently. The patient has not had any fevers or chills. Patient is on 2L nasal cannula at baseline.    Records reviewed. Per medical record review patient has a history of CAD, COPD.   Past Medical History:  Diagnosis Date   Asthma    BPH (benign prostatic hyperplasia)    CAD (coronary artery disease)    COPD (chronic obstructive pulmonary disease) (HCC)    GERD (gastroesophageal reflux disease)    HLD (hyperlipidemia)    Sleep apnea     Patient Active Problem List   Diagnosis Date Noted   Acute respiratory failure with hypoxia and hypercapnia ([place]) 11/28/2016   COPD with acute exacerbation ([place]) 11/28/2016   CAD (coronary artery disease) 11/28/2016   HLD (hyperlipidemia) 11/28/2016   Sleep apnea 11/28/2016   GERD (gastroesophageal reflux disease) 11/28/2016   Acute on chronic respiratory failure with hypoxia and hypercapnia ([place]) 11/28/2016    Past Surgical History:  Procedure Laterality Date   ADENOIDECTOMY     CARDIAC CATHETERIZATION     GALLBLADDER SURGERY     LEFT HEART CATH AND CORONARY ANGIOGRAPHY Left 01/10/2019   Procedure: LEFT HEART CATH AND CORONARY ANGIOGRAPHY;  Surgeon: Teodoro Spray, MD;  Location: [place] [facility];  Service: Cardiovascular;  Laterality: Left;    TONSILLECTOMY      Prior to Admission medications   Medication Sig Start Date End Date Taking? Authorizing Provider  albuterol (PROVENTIL HFA;VENTOLIN HFA) 108 (90 Base) MCG/ACT inhaler Inhale 2 puffs into the lungs every 6 (six) hours as needed for wheezing or shortness of breath.  12/12/13   [provider]  albuterol (PROVENTIL) (2.5 MG/3ML) 0.083% nebulizer solution Take 2.5 mg by nebulization every 6 (six) hours as needed for wheezing or shortness of breath.    [provider]  aspirin EC 81 MG tablet Take 81 mg by mouth daily.    [provider]  atorvastatin (LIPITOR) 80 MG tablet Take 80 mg by mouth daily.    [provider]  budesonide (PULMICORT) 0.5 MG/2ML nebulizer solution Take 0.5 mg by nebulization 2 (two) times daily.    [provider]  Cholecalciferol (VITAMIN D3 PO) Take 1 tablet by mouth daily.    [provider]  COVID-19 mRNA Vac-TriS, Pfizer, SUSP injection USE AS DIRECTED 03/18/20 03/18/21  Carlyle Basques, MD  formoterol (PERFOROMIST) 20 MCG/2ML nebulizer solution Inhale 20 mcg into the lungs 2 (two) times daily.  11/16/17   [provider]  gabapentin (NEURONTIN) 100 MG capsule Take 100 mg by mouth daily.  12/21/17 12/28/19  [provider]  levothyroxine (SYNTHROID) 25 MCG tablet Take 25 mcg by mouth daily before breakfast. 12/15/18   [provider]  loratadine (CLARITIN) 10 MG tablet Take 10 mg by mouth daily. 12/15/18   [provider]  Multiple Vitamins-Minerals (ZINC PO) Take 1 tablet by mouth daily.    [provider]  Tiotropium Bromide Monohydrate 2.5 MCG/ACT AERS Inhale 2 puffs into the lungs daily.    [provider]    Allergies Fluocinolone and Ciprofloxacin hcl  Family History  Problem Relation Age of Onset   Heart disease Other    Kidney disease Neg Hx    Prostate cancer Neg Hx    Kidney cancer Neg Hx    Bladder Cancer Neg Hx     Social  History Social History   Tobacco Use   Smoking status: Former   Smokeless tobacco: Former    Types: Chew   Tobacco comments:    as a teenager  Scientific laboratory technician Use: Never used  Substance Use Topics   Alcohol use: Yes    Alcohol/week: 0.0 standard drinks    Comment: occasionally   Drug use: No    Review of Systems Constitutional: No fever/chills Eyes: No visual changes. ENT: No sore throat. Cardiovascular: Denies chest pain. Respiratory: Denies shortness of breath. Gastrointestinal: No abdominal pain.  No nausea, no vomiting.  No diarrhea.   Genitourinary: Negative for dysuria. Musculoskeletal: Negative for back pain. Skin: Negative for rash. Neurological: Positive for dizziness.  ____________________________________________   PHYSICAL EXAM:  VITAL SIGNS: ED Triage Vitals  Enc Vitals Group     BP 10/27/20 1500 (!) 89/68     Pulse Rate 10/27/20 1503 (!) 123     Resp 10/27/20 1500 20     Temp 10/27/20 1500 98.8 F (37.1 C)     Temp Source 10/27/20 1500 Oral     SpO2 10/27/20 1503 (!) 76 %     Weight 10/27/20 1457 162 lb (73.5 kg)     Height 10/27/20 1457 6' (1.829 m)     Head Circumference --      Peak Flow --      Pain Score 10/27/20 1457 0   Constitutional: Alert and oriented.  Eyes: Conjunctivae are normal.  ENT      Head: Normocephalic and atraumatic.      Nose: No congestion/rhinnorhea.      Mouth/Throat: Mucous membranes are moist.      Neck: No stridor. Hematological/Lymphatic/Immunilogical: No cervical lymphadenopathy. Cardiovascular: Normal rate, regular rhythm.  No murmurs, rubs, or gallops.  Respiratory: Normal respiratory effort without tachypnea nor retractions. Breath sounds are clear and equal bilaterally. No wheezes/rales/rhonchi. Gastrointestinal: Soft and non tender. No rebound. No guarding.  Genitourinary: Deferred Musculoskeletal: Normal range of motion in all extremities. No lower extremity edema. Neurologic:  Normal speech and  language. No gross focal neurologic deficits are appreciated.  Skin:  Skin is warm, dry and intact. No rash noted. Psychiatric: Mood and affect are normal. Speech and behavior are normal. Patient exhibits appropriate insight and judgment.  ____________________________________________    LABS (pertinent positives/negatives)  CBC wbc 14.3, hgb 15.1, plt 147 BMP wnl except glu 128, ca 8.3  ____________________________________________   EKG  I, Nance Pear, attending physician, personally viewed and interpreted this EKG  EKG Time: 1509 Rate: 84 Rhythm: sinus rhythm with 1st degree av block Axis: left axis deviation Intervals: qtc 408 QRS: Incomplete RBBB ST changes: no st elevation Impression: abnormal ekg ____________________________________________    RADIOLOGY  CT head No acute abnormality  CXR Left lower lobe mass with possible infiltrate  CT chest Masslike consolidation in left lower lobe. ____________________________________________   PROCEDURES  Procedures  ____________________________________________   INITIAL IMPRESSION / ASSESSMENT AND PLAN /  ED COURSE  Pertinent labs & imaging results that were available during my care of the patient were reviewed by me and considered in my medical decision making (see chart for details).   Patient presented to the emergency department today because of concerns for feeling dizzy and drunk when he has been walking around.  The patient initial blood work was concerning for some slight leukocytosis.  He does have a history of requiring 2 L of oxygen at baseline but he did have to have that increase here in the emergency department.  Because of this I did have concern for possible pneumonia.  Chest x-ray was obtained which was concerning for possible mass and postobstructive pneumonia.  Did check a CT scan.  At this time still have concern for possible mass and pneumonia.  I discussed this with patient and family.  Patient  was a started on IV fluids and will plan on admission to the hospitalist service. ____________________________________________   FINAL CLINICAL IMPRESSION(S) / ED DIAGNOSES  Final diagnoses:  Pneumonia due to infectious organism, unspecified laterality, unspecified part of lung  Lung mass     Note: This dictation was prepared with Dragon dictation. Any transcriptional errors that result from this process are unintentional     Nance Pear, MD 10/27/20 2212

## 2020-10-28 DIAGNOSIS — R42 Dizziness and giddiness: Secondary | ICD-10-CM | POA: Diagnosis not present

## 2020-10-28 LAB — URINALYSIS, COMPLETE (UACMP) WITH MICROSCOPIC
Bilirubin Urine: NEGATIVE
Glucose, UA: NEGATIVE mg/dL
Hgb urine dipstick: NEGATIVE
Ketones, ur: NEGATIVE mg/dL
Leukocytes,Ua: NEGATIVE
Nitrite: NEGATIVE
Protein, ur: NEGATIVE mg/dL
Specific Gravity, Urine: 1.015 (ref 1.005–1.030)
Squamous Epithelial / HPF: NONE SEEN (ref 0–5)
pH: 5.5 (ref 5.0–8.0)

## 2020-10-28 LAB — COMPREHENSIVE METABOLIC PANEL
ALT: 13 U/L (ref 0–44)
AST: 24 U/L (ref 15–41)
Albumin: 2.5 g/dL — ABNORMAL LOW (ref 3.5–5.0)
Alkaline Phosphatase: 69 U/L (ref 38–126)
Anion gap: 5 (ref 5–15)
BUN: 12 mg/dL (ref 8–23)
CO2: 31 mmol/L (ref 22–32)
Calcium: 7.9 mg/dL — ABNORMAL LOW (ref 8.9–10.3)
Chloride: 99 mmol/L (ref 98–111)
Creatinine, Ser: 0.81 mg/dL (ref 0.61–1.24)
GFR, Estimated: >60 mL/min (ref >60)
Glucose, Bld: 117 mg/dL — ABNORMAL HIGH (ref 70–99)
Potassium: 4 mmol/L (ref 3.5–5.1)
Sodium: 135 mmol/L (ref 135–145)
Total Bilirubin: 0.9 mg/dL (ref 0.3–1.2)
Total Protein: 5.8 g/dL — ABNORMAL LOW (ref 6.5–8.1)

## 2020-10-28 LAB — CBC
HCT: 35.7 % — ABNORMAL LOW (ref 39.0–52.0)
Hemoglobin: 11.7 g/dL — ABNORMAL LOW (ref 13.0–17.0)
MCH: 31.6 pg (ref 26.0–34.0)
MCHC: 32.8 g/dL (ref 30.0–36.0)
MCV: 96.5 fL (ref 80.0–100.0)
Platelets: 173 10*3/uL (ref 150–400)
RBC: 3.7 MIL/uL — ABNORMAL LOW (ref 4.22–5.81)
RDW: 14.1 % (ref 11.5–15.5)
WBC: 13 10*3/uL — ABNORMAL HIGH (ref 4.0–10.5)
nRBC: 0 % (ref 0.0–0.2)

## 2020-10-28 LAB — STREP PNEUMONIAE URINARY ANTIGEN: Strep Pneumo Urinary Antigen: NEGATIVE

## 2020-10-28 LAB — LACTIC ACID, PLASMA: Lactic Acid, Venous: 0.9 mmol/L (ref 0.5–1.9)

## 2020-10-28 LAB — PROCALCITONIN: Procalcitonin: <0.1 ng/mL

## 2020-10-28 LAB — MAGNESIUM: Magnesium: 1.9 mg/dL (ref 1.7–2.4)

## 2020-10-28 NOTE — ED Notes (Signed)
Patient sat up, breakfast tray provided.

## 2020-10-28 NOTE — Progress Notes (Signed)
PT Cancellation Note  Patient Details Name: Chris Lozano MRN: [id number] DOB: 11-27-1940   Cancelled Treatment:    Reason Eval/Treat Not Completed: Other (comment) Chart reviewed and spoke with nurse clearing activity with PT.  However he was getting a breathing treatment and then wanted to eat breakfast.  MD later saw pt (still eating) and indicated that we hold PT until after pulmonology has been able to see him as well. Will hold this AM and attempt PT exam at later time/date.   Kreg Shropshire 10/28/2020, 9:49 AM

## 2020-10-28 NOTE — ED Notes (Addendum)
Patient placed on home cpap machine. 5L 02 bled into machine.

## 2020-10-28 NOTE — Progress Notes (Signed)
PROGRESS NOTE    Chris Lozano  ZSW:[id number] DOB: Apr 06, 1940 DOA: 10/27/2020 PCP: Adin Hector, MD   Brief Narrative:  This 80 years old male with PMH significant for coronary artery disease, PVD, aortic sclerosis, COPD, chronic hypoxic respiratory failure on 3 L of supplemental oxygen at baseline, hyperlipidemia, chronic anxiety and depression, iron deficiency anemia, chronic thrombocytopenia, hypothyroidism, BPH who was sent from PCP office with recurrent dizziness and unsteadiness on his feet for 2 weeks.  Patient reports dizziness mainly happens in the morning,  denies any falls.  Patient also reports unintentional weight loss, shortness of breath with minimal exertion, poor appetite and chronic productive cough with yellow phlegm. Work-up in the ED reveals new left lower lobe lung mass with suspected postobstructive pneumonia.  Patient is started on IV antibiotics with CAP coverage,  his oxygen requirement has increased to 4 L to maintain sats above 90%. Pulmonology consulted, may require lung biopsy.  Assessment & Plan:   Active Problems:   Dizziness  Dizziness: Patient reported having chronic dizziness, unsteady gait, SOB on exertion. This could be secondary to lung malignancy. CT head chronic atrophic and ischemic changes without acute abnormality. Fall precautions, UA unremarkable. PT and OT assessment.  Acute on chronic hypoxic respiratory failure in the setting  of COPD and new left lower lobe mass: Patient remains on 3 L of oxygen at baseline, now requiring 4 L to maintain sats above 90%. This could be due to new left lower lobe lung mass, and postobstructive pneumonia. Labs include leukocytosis, CT chest left lower lobe mass with infiltrate Continue empiric antibiotics ceftriaxone and Zithromax for 5 days Continue home bronchodilators.  Continue Mucinex Procalcitonin 0.10,  lactic acid 0.9 Follow-up sputum culture, strep pneumo and Legionella. Continue supplemental  oxygen. Pulmonology consulted,  may require lung biopsy.  Left lower lobe lung mass: Patient reported unintentional weight loss, decreased appetite. CT chest lung mass likely bronchogenic carcinoma. Pulmonology consulted Dr. Raul Del notified.  Coronary artery disease: Patient denies any chest pain, EKG unremarkable. Hold off anti platelet now, patient may require procedure. Continue home statins.  Hypothyroidism: Continue levothyroxine  BPH: Continue Flomax  Alcohol use disorder: No signs of acute withdrawal on exam Continue CIWA protocol  DVT prophylaxis: Lovenox Code Status: DNR Family Communication: Daughter at bedside Disposition Plan:     Status is: Inpatient  Remains inpatient appropriate because:Inpatient level of care appropriate due to severity of illness  Dispo:  Patient From: Home  Planned Disposition: Home  Medically stable for discharge: No    Consultants:  Pulmonology  Procedures:   Antimicrobials:  Anti-infectives (From admission, onward)    Start     Dose/Rate Route Frequency Ordered Stop   10/28/20 2000  azithromycin (ZITHROMAX) 500 mg in sodium chloride 0.9 % 250 mL IVPB        500 mg 250 mL/hr over 60 Minutes Intravenous Every 24 hours 10/27/20 2122     10/27/20 2200  cefTRIAXone (ROCEPHIN) 2 g in sodium chloride 0.9 % 100 mL IVPB        2 g 200 mL/hr over 30 Minutes Intravenous Every 24 hours 10/27/20 2122     10/27/20 1900  cefTRIAXone (ROCEPHIN) 1 g in sodium chloride 0.9 % 100 mL IVPB  Status:  Discontinued        1 g 200 mL/hr over 30 Minutes Intravenous  Once 10/27/20 1858 10/27/20 2324   10/27/20 1900  azithromycin (ZITHROMAX) 500 mg in sodium chloride 0.9 % 250 mL IVPB  500 mg 250 mL/hr over 60 Minutes Intravenous  Once 10/27/20 1858 10/27/20 2144        Subjective: Patient was seen and examined at bedside.  Overnight events noted.   Patient was sitting in the bed having breakfast.  Appears comfortable. Patient is  weaned down to 3 L of supplemental oxygen, SPO2 92%  Objective: Vitals:   10/28/20 0800 10/28/20 1130 10/28/20 1200 10/28/20 1300  BP:  (!) 98/57 101/64 (!) 93/57  Pulse: 60 (!) 34 67 62  Resp: (!) 21 (!) 29 (!) 30 (!) 22  Temp:      TempSrc:      SpO2: 96% 94% 97% 96%  Weight:      Height:        Intake/Output Summary (Last 24 hours) at 10/28/2020 1420 Last data filed at 10/27/2020 1618 Gross per 24 hour  Intake 500 ml  Output --  Net 500 ml   Filed Weights   10/27/20 1457  Weight: 73.5 kg    Examination:  General exam: Appears comfortable, not in any acute distress , on O2 [place] Respiratory system: Clear to auscultation, respiratory effort normal, RR 19 Cardiovascular system: S1-S2 heard, regular rate and rhythm, no murmur.   Gastrointestinal system: Abdomen is soft, nontender, nondistended, BS + Central nervous system: Alert and oriented. No focal neurological deficits. Extremities: No edema, no cyanosis, no clubbing. Skin: No rashes, lesions or ulcers Psychiatry: Judgement and insight appear normal. Mood & affect appropriate.     Data Reviewed: I have personally reviewed following labs and imaging studies  CBC: Recent Labs  Lab 10/27/20 1500 10/28/20 0336  WBC 14.3* 13.0*  HGB 15.1 11.7*  HCT 46.7 35.7*  MCV 97.7 96.5  PLT 147* 213   Basic Metabolic Panel: Recent Labs  Lab 10/27/20 1500 10/28/20 0336  NA 139 135  K 5.0 4.0  CL 99 99  CO2 31 31  GLUCOSE 128* 117*  BUN 14 12  CREATININE 1.03 0.81  CALCIUM 8.3* 7.9*  MG  --  1.9   GFR: Estimated Creatinine Clearance: 75.6 mL/min (by C-G formula based on SCr of 0.81 mg/dL). Liver Function Tests: Recent Labs  Lab 10/28/20 0336  AST 24  ALT 13  ALKPHOS 69  BILITOT 0.9  PROT 5.8*  ALBUMIN 2.5*   No results for input(s): LIPASE, AMYLASE in the last 168 hours. No results for input(s): AMMONIA in the last 168 hours. Coagulation Profile: No results for input(s): INR, PROTIME in the last 168  hours. Cardiac Enzymes: No results for input(s): CKTOTAL, CKMB, CKMBINDEX, TROPONINI in the last 168 hours. BNP (last 3 results) No results for input(s): PROBNP in the last 8760 hours. HbA1C: No results for input(s): HGBA1C in the last 72 hours. CBG: No results for input(s): GLUCAP in the last 168 hours. Lipid Profile: No results for input(s): CHOL, HDL, LDLCALC, TRIG, CHOLHDL, LDLDIRECT in the last 72 hours. Thyroid Function Tests: No results for input(s): TSH, T4TOTAL, FREET4, T3FREE, THYROIDAB in the last 72 hours. Anemia Panel: No results for input(s): VITAMINB12, FOLATE, FERRITIN, TIBC, IRON, RETICCTPCT in the last 72 hours. Sepsis Labs: Recent Labs  Lab 10/27/20 1946 10/28/20 0336 10/28/20 0655  PROCALCITON  --  <0.10  --   LATICACIDVEN 0.8  --  0.9    Recent Results (from the past 240 hour(s))  Resp Panel by RT-PCR (Flu A&B, Covid) Nasopharyngeal Swab     Status: None   Collection Time: 10/27/20  8:11 PM   Specimen: Nasopharyngeal Swab; Nasopharyngeal(NP)  swabs in vial transport medium  Result Value Ref Range Status   SARS Coronavirus 2 by RT PCR NEGATIVE NEGATIVE Final    Comment: (NOTE) SARS-CoV-2 target nucleic acids are NOT DETECTED.  The SARS-CoV-2 RNA is generally detectable in upper respiratory specimens during the acute phase of infection. The lowest concentration of SARS-CoV-2 viral copies this assay can detect is 138 copies/mL. A negative result does not preclude SARS-Cov-2 infection and should not be used as the sole basis for treatment or other patient management decisions. A negative result may occur with  improper specimen collection/handling, submission of specimen other than nasopharyngeal swab, presence of viral mutation(s) within the areas targeted by this assay, and inadequate number of viral copies(<138 copies/mL). A negative result must be combined with clinical observations, patient history, and epidemiological information. The expected result  is Negative.  Fact Sheet for Patients:  EntrepreneurPulse.com.au  Fact Sheet for Healthcare Providers:  IncredibleEmployment.be  This test is no t yet approved or cleared by the [place] FDA and  has been authorized for detection and/or diagnosis of SARS-CoV-2 by FDA under an Emergency Use Authorization (EUA). This EUA will remain  in effect (meaning this test can be used) for the duration of the COVID-19 declaration under Section 564(b)(1) of the Act, 21 U.S.C.section 360bbb-3(b)(1), unless the authorization is terminated  or revoked sooner.       Influenza A by PCR NEGATIVE NEGATIVE Final   Influenza B by PCR NEGATIVE NEGATIVE Final    Comment: (NOTE) The Xpert Xpress SARS-CoV-2/FLU/RSV plus assay is intended as an aid in the diagnosis of influenza from Nasopharyngeal swab specimens and should not be used as a sole basis for treatment. Nasal washings and aspirates are unacceptable for Xpert Xpress SARS-CoV-2/FLU/RSV testing.  Fact Sheet for Patients: EntrepreneurPulse.com.au  Fact Sheet for Healthcare Providers: IncredibleEmployment.be  This test is not yet approved or cleared by the [place] FDA and has been authorized for detection and/or diagnosis of SARS-CoV-2 by FDA under an Emergency Use Authorization (EUA). This EUA will remain in effect (meaning this test can be used) for the duration of the COVID-19 declaration under Section 564(b)(1) of the Act, 21 U.S.C. section 360bbb-3(b)(1), unless the authorization is terminated or revoked.  Performed at [facility], [address], [place], [place] [address]          Radiology Studies: DG Chest 2 View  Result Date: 10/27/2020 CLINICAL DATA:  Increasing dizziness, elevated white blood cell count, hypoxia EXAM: CHEST - 2 VIEW COMPARISON:  10/27/17 FINDINGS: Cardiac shadow is within normal limits. Aortic calcifications are  noted. Right lung is well aerated with some mild peripheral scarring stable from the prior study. New predominately left lower lobe infiltrative density is noted as well as what appears to be a large left perihilar mass lesion in the superior segment of the left lower lobe. No bony abnormality is noted. IMPRESSION: Changes consistent with a left perihilar mass lesion in the left lower lobe with associated post obstructive infiltrate. CT of the chest with contrast is recommended for further evaluation. Electronically Signed   By: Inez Catalina M.D.   On: 10/27/2020 18:50   CT HEAD WO CONTRAST (5MM)  Result Date: 10/27/2020 CLINICAL DATA:  Increasing dizziness over the past few weeks EXAM: CT HEAD WITHOUT CONTRAST TECHNIQUE: Contiguous axial images were obtained from the base of the skull through the vertex without intravenous contrast. COMPARISON:  11/30/2018 FINDINGS: Brain: No evidence of acute infarction, hemorrhage, hydrocephalus, extra-axial collection or mass lesion/mass  effect. Mild atrophic and chronic white matter ischemic changes are noted. Vascular: No hyperdense vessel or unexpected calcification. Skull: Normal. Negative for fracture or focal lesion. Sinuses/Orbits: No acute finding. Other: None. IMPRESSION: Chronic atrophic and ischemic changes without acute abnormality. Electronically Signed   By: Inez Catalina M.D.   On: 10/27/2020 18:53   CT Chest W Contrast  Result Date: 10/27/2020 CLINICAL DATA:  Dizziness for the past few weeks. Mass versus pneumonia on radiograph. EXAM: CT CHEST WITH CONTRAST TECHNIQUE: Multidetector CT imaging of the chest was performed during intravenous contrast administration. CONTRAST:  11mL OMNIPAQUE IOHEXOL 350 MG/ML SOLN COMPARISON:  Chest radiograph earlier today. Prior chest radiographs reviewed. Most recent CT 01/14/2012 FINDINGS: Cardiovascular: Aortic atherosclerosis and tortuosity. No aortic aneurysm. There are coronary artery calcifications. The heart is  normal in size. No pericardial effusion. No evidence of central pulmonary embolus, exam not tailored to pulmonary artery evaluation. Mediastinum/Nodes: Enlarged subcarinal nodes which appear confluent. For example, nodal conglomerate measuring 3 cm AP, series 2, image 75. 10 mm lower anterior paratracheal node, series 2, image 58. Additional subcentimeter mediastinal lymph nodes, including a 10 mm left paratracheal node, series 2, image 50. Low left hilar node measures 10 mm, series 2, image 72. Left lower lobe masslike consolidation extends to the hilum. No right hilar adenopathy. No supraclavicular adenopathy. The esophagus is difficult to delineate from adjacent adenopathy in the mid lower aspect. There may be esophageal wall thickening. Lungs/Pleura: Rounded masslike consolidation in the left lower lobe measures 5.9 x 4.9 x 5.8 cm, series 3, image 86. There is some central low-density but no definite necrosis or internal air. There is surrounding ill-defined opacity and ground-glass extending to the pleural surface posteriorly and the fissure. Central extent towards the hilum in the left lower lobe bronchus. Patchy and ground-glass opacities distally in the left lower lobe, difficult to delineate airspace disease versus basilar honeycombing. There is moderate emphysema. Breathing motion artifact limits detailed assessment. Subpleural reticulation is noted in the mid lower lung zone on the right. The previous left upper lobe pulmonary nodules appear to represent vascular structures on the current exam. The previous tiny right pulmonary nodules are not convincingly seen. Trace left pleural thickening in the area of consolidation. Upper Abdomen: Motion artifact limits assessment. No visualized adrenal nodule. Cholecystectomy. Probable splenule at the splenic hilum. Musculoskeletal: Partial fusion of left lateral fifth and sixth ribs. This was also seen on prior exam. Definite focal osseous lesion, allowing for  motion artifact limitations IMPRESSION: 1. Rounded masslike consolidation in the left lower lobe measuring 5.9 x 4.9 x 5.8 cm. There is surrounding ill-defined opacity and ground-glass extending to the pleural surface posteriorly and the fissure. Differential considerations include primary bronchogenic malignancy or pneumonia, given mediastinal adenopathy, malignancy is favored. Enlarged subcarinal with borderline mediastinal and left hilar lymph nodes. Consider referral to multi disciplinary thoracic conference. 2. Moderately advanced emphysema. Subpleural reticulation suggests an element of underlying interstitial lung disease. 3. Previous left upper lobe pulmonary nodules appear to represent vascular structures on the current exam. The tiny right lung nodules on remote prior CT are not confidently seen, with current motion artifact limitations. 4. Aortic atherosclerosis and coronary artery calcifications. Aortic Atherosclerosis (ICD10-I70.0) and Emphysema (ICD10-J43.9). Electronically Signed   By: Keith Rake M.D.   On: 10/27/2020 19:42     Scheduled Meds:  arformoterol  15 mcg Nebulization Q12H   atorvastatin  80 mg Oral Daily   enoxaparin (LOVENOX) injection  40 mg Subcutaneous QHS   feeding  supplement  237 mL Oral BID BM   folic acid  1 mg Oral Daily   guaiFENesin  1,200 mg Oral BID   levothyroxine  25 mcg Oral QAC breakfast   multivitamin with minerals  1 tablet Oral Daily   sodium chloride HYPERTONIC  4 mL Nebulization BID   thiamine  100 mg Oral Daily   Or   thiamine  100 mg Intravenous Daily   tiotropium  1 capsule Inhalation Daily   Continuous Infusions:  azithromycin (ZITHROMAX) 500 MG IVPB (Vial-Mate [employer])     cefTRIAXone (ROCEPHIN)  IV       LOS: 1 day    Time spent: 35 mins.    Shawna Clamp, MD [facility]   If 7PM-7AM, please contact night-coverage

## 2020-10-28 NOTE — ED Notes (Signed)
Patient and spouse sleeping. Patient's daughter arrived to room.

## 2020-10-28 NOTE — Consult Note (Addendum)
Pulmonary Medicine          Date: 10/28/2020,   MRN# [id number] Chris Lozano 07-Sep-1940     AdmissionWeight: 73.5 kg                 CurrentWeight: 73.5 kg      CHIEF COMPLAINT:   Resp faiure, lun mass ? Need for bronch   HISTORY OF PRESENT ILLNESS   This is an elderly 80 year old male came in dizzy, resp distress, hypoxic. On w/u noted to have what may be a LLL mass and post obstructive changes.       Presently sitting up in bed, less distress, described the chest ct findings to him and his wife. No hemoptysis,  made aware he may need to be bronched   PAST MEDICAL HISTORY   Past Medical History:  Diagnosis Date  . Asthma   . BPH (benign prostatic hyperplasia)   . CAD (coronary artery disease)   . COPD (chronic obstructive pulmonary disease) ([place])   . GERD (gastroesophageal reflux disease)   . HLD (hyperlipidemia)   . Sleep apnea      SURGICAL HISTORY   Past Surgical History:  Procedure Laterality Date  . ADENOIDECTOMY    . CARDIAC CATHETERIZATION    . GALLBLADDER SURGERY    . LEFT HEART CATH AND CORONARY ANGIOGRAPHY Left 01/10/2019   Procedure: LEFT HEART CATH AND CORONARY ANGIOGRAPHY;  Surgeon: Teodoro Spray, MD;  Location: [facility];  Service: Cardiovascular;  Laterality: Left;  . TONSILLECTOMY       FAMILY HISTORY   Family History  Problem Relation Age of Onset  . Heart disease Other   . Kidney disease Neg Hx   . Prostate cancer Neg Hx   . Kidney cancer Neg Hx   . Bladder Cancer Neg Hx      SOCIAL HISTORY   Social History   Tobacco Use  . Smoking status: Former  . Smokeless tobacco: Former    Types: Chew  . Tobacco comments:    as a teenager  Vaping Use  . Vaping Use: Never used  Substance Use Topics  . Alcohol use: Yes    Alcohol/week: 0.0 standard drinks    Comment: occasionally  . Drug use: No     MEDICATIONS    Home Medication:  Current Outpatient Rx  . Order #: [id number] Class: Historical Med   . Order #: [id number] Class: Historical Med  . Order #: [id number] Class: Historical Med  . Order #: [id number] Class: Historical Med  . Order #: [id number] Class: Historical Med  . Order #: [id number] Class: Historical Med  . Order #: [id number] Class: Historical Med  . Order #: [id number] Class: Historical Med  . Order #: [id number] Class: Historical Med  . Order #: [id number] Class: Historical Med  . Order #: [id number] Class: Historical Med  . Order #: [id number] Class: Historical Med  . Order #: [id number] Class: Historical Med  . Order #: [id number] Class: Historical Med  . Order #: [id number] Class: Historical Med  . Order #: [id number] Class: Normal  . Order #: [id number] Class: Historical Med    Current Medication:  Current Facility-Administered Medications:  .  acetaminophen (TYLENOL) tablet 650 mg, 650 mg, Oral, Q6H PRN, Hall, Carole N, DO .  arformoterol (BROVANA) nebulizer solution 15 mcg, 15 mcg, Nebulization, Q12H, Irene Pap N, DO, 15 mcg at 10/28/20 0914 .  atorvastatin (LIPITOR) tablet 80 mg, 80 mg, Oral, Daily, Irene Pap N, DO, 80 mg at 10/28/20 6720 .  azithromycin (ZITHROMAX) 500 mg in  sodium chloride 0.9 % 250 mL IVPB, 500 mg, Intravenous, Q24H, Hall, Carole N, DO .  cefTRIAXone (ROCEPHIN) 2 g in sodium chloride 0.9 % 100 mL IVPB, 2 g, Intravenous, Q24H, Hall, Carole N, DO .  enoxaparin (LOVENOX) injection 40 mg, 40 mg, Subcutaneous, QHS, Hall, Carole N, DO, 40 mg at 10/27/20 2332 .  feeding supplement (ENSURE ENLIVE / ENSURE PLUS) liquid 237 mL, 237 mL, Oral, BID BM, Hall, Carole N, DO, 237 mL at 10/28/20 1043 .  folic acid (FOLVITE) tablet 1 mg, 1 mg, Oral, Daily, Hall, Carole N, DO, 1 mg at 10/28/20 8527 .  guaiFENesin (MUCINEX) 12 hr tablet 1,200 mg, 1,200 mg, Oral, BID, Irene Pap N, DO, 1,200 mg at 10/28/20 7824 .  levothyroxine (SYNTHROID) tablet 25 mcg, 25 mcg, Oral, QAC breakfast, Irene Pap N, DO, 25 mcg at 10/28/20 2353 .  LORazepam (ATIVAN) tablet 1-4 mg, 1-4 mg, Oral, Q1H  PRN **OR** LORazepam (ATIVAN) injection 1-4 mg, 1-4 mg, Intravenous, Q1H PRN, Hall, Carole N, DO .  melatonin tablet 2.5 mg, 2.5 mg, Oral, QHS PRN, Irene Pap N, DO .  multivitamin with minerals tablet 1 tablet, 1 tablet, Oral, Daily, Irene Pap N, DO, 1 tablet at 10/28/20 [phone] .  ondansetron (ZOFRAN) injection 4 mg, 4 mg, Intravenous, Q6H PRN, Hall, Carole N, DO .  polyethylene glycol (MIRALAX / GLYCOLAX) packet 17 g, 17 g, Oral, Daily PRN, Hall, Carole N, DO .  sodium chloride HYPERTONIC 3 % nebulizer solution 4 mL, 4 mL, Nebulization, BID, Hall, Carole N, DO, 4 mL at 10/28/20 0953 .  thiamine tablet 100 mg, 100 mg, Oral, Daily, 100 mg at 10/28/20 3154 **OR** thiamine (B-1) injection 100 mg, 100 mg, Intravenous, Daily, Hall, Carole N, DO .  tiotropium (SPIRIVA) inhalation capsule ([facility] use ONLY) 18 mcg, 1 capsule, Inhalation, Daily, Kayleen Memos, DO, 18 mcg at 10/28/20 0086  Current Outpatient Medications:  .  albuterol (PROVENTIL HFA;VENTOLIN HFA) 108 (90 Base) MCG/ACT inhaler, Inhale 2 puffs into the lungs every 6 (six) hours as needed for wheezing or shortness of breath. , Disp: , Rfl:  .  albuterol (PROVENTIL) (2.5 MG/3ML) 0.083% nebulizer solution, Take 2.5 mg by nebulization every 6 (six) hours as needed for wheezing or shortness of breath., Disp: , Rfl:  .  aspirin EC 81 MG tablet, Take 81 mg by mouth daily., Disp: , Rfl:  .  atorvastatin (LIPITOR) 80 MG tablet, Take 80 mg by mouth daily., Disp: , Rfl:  .  budesonide (PULMICORT) 0.5 MG/2ML nebulizer solution, Take 0.5 mg by nebulization 2 (two) times daily., Disp: , Rfl:  .  carvedilol (COREG) 3.125 MG tablet, Take 3.125 mg by mouth 2 (two) times daily., Disp: , Rfl:  .  Cholecalciferol (VITAMIN D3 PO), Take 1 tablet by mouth daily., Disp: , Rfl:  .  formoterol (PERFOROMIST) 20 MCG/2ML nebulizer solution, Inhale 20 mcg into the lungs 2 (two) times daily. , Disp: , Rfl:  .  levothyroxine (SYNTHROID) 25 MCG tablet, Take 25 mcg by mouth  daily before breakfast., Disp: , Rfl:  .  loratadine (CLARITIN) 10 MG tablet, Take 10 mg by mouth daily., Disp: , Rfl:  .  mirtazapine (REMERON) 15 MG tablet, Take 15 mg by mouth at bedtime., Disp: , Rfl:  .  Multiple Vitamins-Minerals (ZINC PO), Take 1 tablet by mouth daily., Disp: , Rfl:  .  oxybutynin (DITROPAN-XL) 5 MG 24 hr tablet, Take 5 mg by mouth daily., Disp: , Rfl:  .  tamsulosin (FLOMAX)  0.4 MG CAPS capsule, Take 0.8 mg by mouth daily., Disp: , Rfl:  .  Tiotropium Bromide Monohydrate 2.5 MCG/ACT AERS, Inhale 2 puffs into the lungs daily., Disp: , Rfl:  .  COVID-19 mRNA Vac-TriS, Pfizer, SUSP injection, USE AS DIRECTED, Disp: .3 mL, Rfl: 0 .  gabapentin (NEURONTIN) 100 MG capsule, Take 100 mg by mouth daily. , Disp: , Rfl:   Facility-Administered Medications Ordered in Other Encounters:  .  sodium chloride flush (NS) 0.9 % injection 3 mL, 3 mL, Intravenous, Q12H, Fath, Javier Docker, MD    ALLERGIES   Fluocinolone and Ciprofloxacin hcl     REVIEW OF SYSTEMS    Review of Systems:  Gen:  Denies  fever, sweats, chills weigh loss  HEENT: Denies blurred vision, double vision, ear pain, eye pain, hearing loss, nose bleeds, sore throat Cardiac:  No dizziness, chest pain or heaviness, chest tightness,edema Resp:   Denies cough or sputum porduction, shortness of breath,wheezing, hemoptysis,  Gi: Denies swallowing difficulty, stomach pain, nausea or vomiting, diarrhea, constipation, bowel incontinence Gu:  Denies bladder incontinence, burning urine Ext:   Denies Joint pain, stiffness or swelling Skin: Denies  skin rash, easy bruising or bleeding or hives Endoc:  Denies polyuria, polydipsia , polyphagia or weight change Psych:   Denies depression, insomnia or hallucinations   Other:  All other systems negative   VS: BP (!) 93/57   Pulse 62   Temp 98.9 F (37.2 C) (Oral)   Resp (!) 22   Ht 6' (1.829 m)   Wt 73.5 kg   SpO2 96%   BMI 21.97 kg/m      PHYSICAL EXAM     GENERAL:NAD, no fevers, chills, no weakness no fatigue HEAD: Normocephalic, atraumatic.  EYES: Pupils equal, round, reactive to light. Extraocular muscles intact. No scleral icterus.  MOUTH: Moist mucosal membrane. Dentition intact. No abscess noted.  EAR, NOSE, THROAT: Clear without exudates. No external lesions.  NECK: Supple. No thyromegaly. No nodules. No JVD.  PULMONARY: Diffuse coarse rhonchi right sided +wheezes CARDIOVASCULAR: S1 and S2. Regular rate and rhythm. No murmurs, rubs, or gallops. No edema. Pedal pulses 2+ bilaterally.  GASTROINTESTINAL: Soft, nontender, nondistended. No masses. Positive bowel sounds. No hepatosplenomegaly.  MUSCULOSKELETAL: No swelling, clubbing, or edema. Range of motion full in all extremities.  NEUROLOGIC: Cranial nerves II through XII are intact. No gross focal neurological deficits. Sensation intact. Reflexes intact.  SKIN: No ulceration, lesions, rashes, or cyanosis. Skin warm and dry. Turgor intact.  PSYCHIATRIC: Mood, affect within normal limits. The patient is awake, alert and oriented x 3. Insight, judgment intact.       IMAGING  INTERPRETATION  MODERATE LV SYSTOLIC DYSFUNCTION (See above)   WITH MILD LVH  NORMAL RIGHT VENTRICULAR SYSTOLIC FUNCTION  MILD VALVULAR REGURGITATION (See above)  NO VALVULAR STENOSIS  MILD MR, TR, PR  EF 35-40%  Contraction: MOD GLOBAL DECREASE  LVH: MILD LVH  Mitral: MILD MR  Tricuspid: MILD TR  Closest EF: 40% (Estimated)   DG Chest 2 View  Result Date: 10/27/2020 CLINICAL DATA:  Increasing dizziness, elevated white blood cell count, hypoxia EXAM: CHEST - 2 VIEW COMPARISON:  10/27/17 FINDINGS: Cardiac shadow is within normal limits. Aortic calcifications are noted. Right lung is well aerated with some mild peripheral scarring stable from the prior study. New predominately left lower lobe infiltrative density is noted as well as what appears to be a large left perihilar mass lesion in the superior  segment of the left lower lobe. No  bony abnormality is noted. IMPRESSION: Changes consistent with a left perihilar mass lesion in the left lower lobe with associated post obstructive infiltrate. CT of the chest with contrast is recommended for further evaluation. Electronically Signed   By: Inez Catalina M.D.   On: 10/27/2020 18:50   CT HEAD WO CONTRAST (5MM)  Result Date: 10/27/2020 CLINICAL DATA:  Increasing dizziness over the past few weeks EXAM: CT HEAD WITHOUT CONTRAST TECHNIQUE: Contiguous axial images were obtained from the base of the skull through the vertex without intravenous contrast. COMPARISON:  11/30/2018 FINDINGS: Brain: No evidence of acute infarction, hemorrhage, hydrocephalus, extra-axial collection or mass lesion/mass effect. Mild atrophic and chronic white matter ischemic changes are noted. Vascular: No hyperdense vessel or unexpected calcification. Skull: Normal. Negative for fracture or focal lesion. Sinuses/Orbits: No acute finding. Other: None. IMPRESSION: Chronic atrophic and ischemic changes without acute abnormality. Electronically Signed   By: Inez Catalina M.D.   On: 10/27/2020 18:53   CT Chest W Contrast  Result Date: 10/27/2020 CLINICAL DATA:  Dizziness for the past few weeks. Mass versus pneumonia on radiograph. EXAM: CT CHEST WITH CONTRAST TECHNIQUE: Multidetector CT imaging of the chest was performed during intravenous contrast administration. CONTRAST:  17mL OMNIPAQUE IOHEXOL 350 MG/ML SOLN COMPARISON:  Chest radiograph earlier today. Prior chest radiographs reviewed. Most recent CT 01/14/2012 FINDINGS: Cardiovascular: Aortic atherosclerosis and tortuosity. No aortic aneurysm. There are coronary artery calcifications. The heart is normal in size. No pericardial effusion. No evidence of central pulmonary embolus, exam not tailored to pulmonary artery evaluation. Mediastinum/Nodes: Enlarged subcarinal nodes which appear confluent. For example, nodal conglomerate measuring 3 cm  AP, series 2, image 75. 10 mm lower anterior paratracheal node, series 2, image 58. Additional subcentimeter mediastinal lymph nodes, including a 10 mm left paratracheal node, series 2, image 50. Low left hilar node measures 10 mm, series 2, image 72. Left lower lobe masslike consolidation extends to the hilum. No right hilar adenopathy. No supraclavicular adenopathy. The esophagus is difficult to delineate from adjacent adenopathy in the mid lower aspect. There may be esophageal wall thickening. Lungs/Pleura: Rounded masslike consolidation in the left lower lobe measures 5.9 x 4.9 x 5.8 cm, series 3, image 86. There is some central low-density but no definite necrosis or internal air. There is surrounding ill-defined opacity and ground-glass extending to the pleural surface posteriorly and the fissure. Central extent towards the hilum in the left lower lobe bronchus. Patchy and ground-glass opacities distally in the left lower lobe, difficult to delineate airspace disease versus basilar honeycombing. There is moderate emphysema. Breathing motion artifact limits detailed assessment. Subpleural reticulation is noted in the mid lower lung zone on the right. The previous left upper lobe pulmonary nodules appear to represent vascular structures on the current exam. The previous tiny right pulmonary nodules are not convincingly seen. Trace left pleural thickening in the area of consolidation. Upper Abdomen: Motion artifact limits assessment. No visualized adrenal nodule. Cholecystectomy. Probable splenule at the splenic hilum. Musculoskeletal: Partial fusion of left lateral fifth and sixth ribs. This was also seen on prior exam. Definite focal osseous lesion, allowing for motion artifact limitations IMPRESSION: 1. Rounded masslike consolidation in the left lower lobe measuring 5.9 x 4.9 x 5.8 cm. There is surrounding ill-defined opacity and ground-glass extending to the pleural surface posteriorly and the fissure.  Differential considerations include primary bronchogenic malignancy or pneumonia, given mediastinal adenopathy, malignancy is favored. Enlarged subcarinal with borderline mediastinal and left hilar lymph nodes. Consider referral to multi disciplinary thoracic conference.  2. Moderately advanced emphysema. Subpleural reticulation suggests an element of underlying interstitial lung disease. 3. Previous left upper lobe pulmonary nodules appear to represent vascular structures on the current exam. The tiny right lung nodules on remote prior CT are not confidently seen, with current motion artifact limitations. 4. Aortic atherosclerosis and coronary artery calcifications. Aortic Atherosclerosis (ICD10-I70.0) and Emphysema (ICD10-J43.9). Electronically Signed   By: Keith Rake M.D.   On: 10/27/2020 19:42      ASSESSMENT/PLAN   This is an 80 yr old male, came in for dizziness, on w/u noted have consolidation in the left  lung. Nodes may be reactive. Not clear if this is all pneumonia and not a mass. Will track down his last cxr at [facility]. With his of wieght loss, decrease appetite a malignancy is part of the differential. Recommend we treat for pneumonia ( rocephin/zithro),tune him up first  Repeat chest ct in 4- 6 weeks  And if need be pursue bronchoscopy here or with his pulmonologist at [facility] nebs q 6 hrs ot the equivalent Oxyge supplementation, keep sats 92%   Moderate LVD, EF  40 % Continue his cardiac regimen  Hx of  OSA - Bipap 12/6 cn h20          Thank you for allowing me to participate in the care of this patient.   Patient/Family are satisfied with care plan and all questions have been answered.  This document was prepared using Dragon voice recognition software and may include unintentional dictation errors.     Wallene Huh, M.D.  Division of [facility]

## 2020-10-28 NOTE — Evaluation (Signed)
Physical Therapy Evaluation Patient Details Name: Chris Lozano MRN: [id number] DOB: 03-Aug-1940 Today's Date: 10/28/2020  History of Present Illness  80 y.o. male with medical history significant for coronary artery disease, PVD, aortic atherosclerosis, COPD/emphysema, chronic hypoxic respiratory failure on 3 L continuously at baseline, hyperlipidemia, chronic anxiety/depression, iron deficiency anemia, chronic thrombocytopenia, hypothyroidism, BPH, who presented to [facility] ED at his PCPs recommendation due to recurrent dizziness and unsteadiness on his feet x 2 weeks.  Clinical Impression  Pt did well with mobility, was able to ambulate ~100 ft w/o AD and not have any orthostatic symptoms (or BP readings) or safety concerns with the effort.  He showed good confidence and reports being near his baseline for mobility but still feeling weak.  Pt's O2 did drop to ~80 on 2L during activity, has been on 4L maintaining high 90s at rest.  Will maintain on caseload to further assess mobility/CV response but per expected trajectory should not have PT needs to safely d/c home when medically cleared.       Recommendations for follow up therapy are one component of a multi-disciplinary discharge planning process, led by the attending physician.  Recommendations may be updated based on patient status, additional functional criteria and insurance authorization.  Follow Up Recommendations No PT follow up (will maintain on caseload while admitted to maximize activity and insure safe d/c planning)    Equipment Recommendations  None recommended by PT    Recommendations for Other Services       Precautions / Restrictions Restrictions Weight Bearing Restrictions: No      Mobility  Bed Mobility Overal bed mobility: Modified Independent             General bed mobility comments: Pt able to get out/into bed w/o assist    Transfers Overall transfer level: Modified independent Equipment used: Rolling  walker (2 wheeled)             General transfer comment: Pt was able to rise to standing w/o assist, AD or hesitation.  He showed good confidence and no safety issues.  Ambulation/Gait Ambulation/Gait assistance: Supervision Gait Distance (Feet): 100 Feet Assistive device: None       General Gait Details: Pt was able to ambulate with relatively consistent and safe cadence w/o AD and w/o hesitation.  He did have O2 drop from mid 90s to ~80 on 2L (his baseline, has been on 4L in ED)  Stairs            Wheelchair Mobility    Modified Rankin (Stroke Patients Only)       Balance Overall balance assessment: Modified Independent                                           Pertinent Vitals/Pain Pain Assessment: No/denies pain    Home Living Family/patient expects to be discharged to:: Private residence Living Arrangements: Spouse/significant other Available Help at Discharge: Family;Available 24 hours/day   Home Access: Stairs to enter Entrance Stairs-Rails: Can reach both Entrance Stairs-Number of Steps: 4   Home Equipment:  (has but does not use/need)      Prior Function Level of Independence: Independent         Comments: apart from being on 2L O2 24/7 he is able to be active in the home, run light errands, go out of eat, etc     Hand Dominance  Extremity/Trunk Assessment   Upper Extremity Assessment Upper Extremity Assessment: Generalized weakness (age appropriate limitations)    Lower Extremity Assessment Lower Extremity Assessment: Generalized weakness (age appropriate limitations)       Communication   Communication: No difficulties  Cognition Arousal/Alertness: Awake/alert Behavior During Therapy: WFL for tasks assessed/performed Overall Cognitive Status: Within Functional Limits for tasks assessed                                        General Comments General comments (skin integrity, edema,  etc.): Orthostatics supine: 86/50, sitting: 83/56, standing: 98/57. Asymptomatic with no dizziness, etc t/o the entire session.    Exercises     Assessment/Plan    PT Assessment Patient needs continued PT services  PT Problem List Decreased strength;Decreased range of motion;Decreased activity tolerance;Decreased balance;Decreased mobility;Decreased coordination;Decreased safety awareness;Cardiopulmonary status limiting activity       PT Treatment Interventions DME instruction;Gait training;Stair training;Functional mobility training;Therapeutic activities;Therapeutic exercise;Balance training;Neuromuscular re-education    PT Goals (Current goals can be found in the Care Plan section)  Acute Rehab PT Goals Patient Stated Goal: Go home PT Goal Formulation: With patient Time For Goal Achievement: 11/11/20 Potential to Achieve Goals: Fair    Frequency Min 2X/week   Barriers to discharge        Co-evaluation               AM-PAC PT "6 Clicks" Mobility  Outcome Measure Help needed turning from your back to your side while in a flat bed without using bedrails?: None Help needed moving from lying on your back to sitting on the side of a flat bed without using bedrails?: None Help needed moving to and from a bed to a chair (including a wheelchair)?: None Help needed standing up from a chair using your arms (e.g., wheelchair or bedside chair)?: None Help needed to walk in hospital room?: A Little Help needed climbing 3-5 steps with a railing? : A Little 6 Click Score: 22    End of Session Equipment Utilized During Treatment: Gait belt Activity Tolerance: Patient limited by fatigue Patient left: in bed;with call bell/phone within reach Nurse Communication: Mobility status (O2 status) PT Visit Diagnosis: Difficulty in walking, not elsewhere classified (R26.2);Unsteadiness on feet (R26.81)    Time: 6578-4696 PT Time Calculation (min) (ACUTE ONLY): 29 min   Charges:   PT  Evaluation $PT Eval Low Complexity: 1 Low PT Treatments $Gait Training: 8-22 mins        Kreg Shropshire, DPT 10/28/2020, 12:35 PM

## 2020-10-28 NOTE — Evaluation (Signed)
Occupational Therapy Evaluation Patient Details Name: SABRI TEAL MRN: [id number] DOB: 11-Dec-1940 Today's Date: 10/28/2020   History of Present Illness 80 y.o. male with medical history significant for coronary artery disease, PVD, aortic atherosclerosis, COPD/emphysema, chronic hypoxic respiratory failure on 3 L continuously at baseline, hyperlipidemia, chronic anxiety/depression, iron deficiency anemia, chronic thrombocytopenia, hypothyroidism, BPH, who presented to [facility] ED at his PCPs recommendation due to recurrent dizziness and unsteadiness on his feet x 2 weeks.   Clinical Impression   Pt seen for OT evaluation this date. Upon arrival to room, pt awake and seated upright in bed on 4L of O2, with daughter at bedside. Pt agreeable to OT evaluation. Prior to admission, pt was independent in all ADLs and functional mobility, living in a 1-story home with wife. Pt was active and driving. Pt on 2 liters of O2 at home. Pt currently requires SUPERVISION for seated LB dressing, SUPERVISION for functional mobility of household distances (~68ft) without AD, SUPERVISION for toilet transfers, and SUPERVISION for standing grooming tasks due to current functional impairments (See OT Problem List below). Pt demonstrating good management of O2 lines during functional mobility/ADLs however following functional mobility and toileting, pt with SpO2 77% (SpO2 able to increase to 92% following 2 mins of activity cessation and implementation of purse lip breathing; RN informed). At end of session, pt in bed, on 4L with SpO2 93% and pt in no acute distress. Pt would benefit from additional skilled OT services to maximize return to PLOF and minimize risk of future falls, injury, caregiver burden, and readmission. Upon discharge, recommend no OT follow-up.       Recommendations for follow up therapy are one component of a multi-disciplinary discharge planning process, led by the attending physician.  Recommendations may  be updated based on patient status, additional functional criteria and insurance authorization.   Follow Up Recommendations  No OT follow up;Supervision/Assistance - 24 hour    Equipment Recommendations  None recommended by OT       Precautions / Restrictions Precautions Precautions: Fall Restrictions Weight Bearing Restrictions: No      Mobility Bed Mobility Overal bed mobility: Modified Independent             General bed mobility comments: With HOB elevated, pt able to perform bed mobility without physical assist    Transfers Overall transfer level: Needs assistance Equipment used: None Transfers: Sit to/from Stand Sit to Stand: Supervision         General transfer comment: SUPERVISION for sit<>stand toilet transfers    Balance Overall balance assessment: Needs assistance Sitting-balance support: No upper extremity supported;Feet supported Sitting balance-Leahy Scale: Good Sitting balance - Comments: good static sitting balance on toilet   Standing balance support: No upper extremity supported;During functional activity Standing balance-Leahy Scale: Fair Standing balance comment: SUPERVISION for functional mobility of short household distances (~26ft) without AD. Requires supervision to monitor vitals and cue pt to initiate energy conservation strategies                           ADL either performed or assessed with clinical judgement   ADL Overall ADL's : Needs assistance/impaired     Grooming: Wash/dry hands;Supervision/safety;Standing               Lower Body Dressing: Supervision/safety;Sitting/lateral leans Lower Body Dressing Details (indicate cue type and reason): to don/doff socks seated EOB Toilet Transfer: Supervision/safety;Ambulation;Regular Toilet;Grab bars  Functional mobility during ADLs: Supervision/safety (supervision d/t monitoring vitals and cuing pt to initiate energy conservation strategies)        Vision Baseline Vision/History: 1 Wears glasses              Pertinent Vitals/Pain Pain Assessment: No/denies pain     Hand Dominance Right   Extremity/Trunk Assessment Upper Extremity Assessment Upper Extremity Assessment: Overall WFL for tasks assessed   Lower Extremity Assessment Lower Extremity Assessment: Generalized weakness       Communication Communication Communication: No difficulties   Cognition Arousal/Alertness: Awake/alert Behavior During Therapy: WFL for tasks assessed/performed Overall Cognitive Status: Within Functional Limits for tasks assessed                                     General Comments  Pt denying dizziness throughout session. Pt on 4L of O2. SpO2 desat 77% during functional mobility, however able to improve to >92% within 2 mins following activity cessation and PLB. RN informed. At end of session, pt in bed, on 4L with SpO2 93% and pt in no acute distress.            Home Living Family/patient expects to be discharged to:: Private residence Living Arrangements: Spouse/significant other Available Help at Discharge: Family;Available 24 hours/day   Home Access: Stairs to enter [employer] of Steps: 4 Entrance Stairs-Rails: Can reach both Home Layout: One level     Bathroom Shower/Tub: Tub/shower unit         Home Equipment: Grab bars - tub/shower;Shower seat          Prior Functioning/Environment Level of Independence: Independent        Comments: Apart from using 2L O2 at home, he is independent with ADLs and functional mobility (without AD). Pt drives and is active        OT Problem List: Decreased activity tolerance;Impaired balance (sitting and/or standing);Cardiopulmonary status limiting activity      OT Treatment/Interventions: Self-care/ADL training;Therapeutic exercise;Energy conservation;DME and/or AE instruction;Therapeutic activities;Patient/family education;Balance training     OT Goals(Current goals can be found in the care plan section) Acute Rehab OT Goals Patient Stated Goal: to go home OT Goal Formulation: With patient Time For Goal Achievement: 11/11/20 Potential to Achieve Goals: Good ADL Goals Pt Will Perform Grooming: with modified independence;standing Pt Will Transfer to Toilet: with modified independence;ambulating;regular height toilet Pt Will Perform Toileting - Clothing Manipulation and hygiene: with modified independence;sitting/lateral leans  OT Frequency: Min 1X/week    AM-PAC OT "6 Clicks" Daily Activity     Outcome Measure Help from another person eating meals?: None Help from another person taking care of personal grooming?: A Little Help from another person toileting, which includes using toliet, bedpan, or urinal?: A Little Help from another person bathing (including washing, rinsing, drying)?: A Little Help from another person to put on and taking off regular upper body clothing?: None Help from another person to put on and taking off regular lower body clothing?: A Little 6 Click Score: 20   End of Session Nurse Communication: Mobility status;Other (comment) (SpO2 during mobility)  Activity Tolerance: Patient tolerated treatment well Patient left: in bed;with call bell/phone within reach  OT Visit Diagnosis: Muscle weakness (generalized) (M62.81);Unsteadiness on feet (R26.81)                Time: 6063-0160 OT Time Calculation (min): 25 min Charges:  OT General Charges $OT Visit: 1  Visit OT Evaluation $OT Eval Moderate Complexity: 1 Mod OT Treatments $Self Care/Home Management : 8-22 mins  Fredirick Maudlin, OTR/L [place]

## 2020-10-29 DIAGNOSIS — R918 Other nonspecific abnormal finding of lung field: Secondary | ICD-10-CM

## 2020-10-29 DIAGNOSIS — J984 Other disorders of lung: Secondary | ICD-10-CM

## 2020-10-29 DIAGNOSIS — J9621 Acute and chronic respiratory failure with hypoxia: Secondary | ICD-10-CM

## 2020-10-29 DIAGNOSIS — C3492 Malignant neoplasm of unspecified part of left bronchus or lung: Secondary | ICD-10-CM

## 2020-10-29 LAB — URINE CULTURE: Culture: NO GROWTH

## 2020-10-29 LAB — LEGIONELLA PNEUMOPHILA SEROGP 1 UR AG: L. pneumophila Serogp 1 Ur Ag: NEGATIVE

## 2020-10-29 LAB — BASIC METABOLIC PANEL
Anion gap: 4 — ABNORMAL LOW (ref 5–15)
BUN: 12 mg/dL (ref 8–23)
CO2: 33 mmol/L — ABNORMAL HIGH (ref 22–32)
Calcium: 8 mg/dL — ABNORMAL LOW (ref 8.9–10.3)
Chloride: 101 mmol/L (ref 98–111)
Creatinine, Ser: 0.75 mg/dL (ref 0.61–1.24)
GFR, Estimated: >60 mL/min (ref >60)
Glucose, Bld: 119 mg/dL — ABNORMAL HIGH (ref 70–99)
Potassium: 4.4 mmol/L (ref 3.5–5.1)
Sodium: 138 mmol/L (ref 135–145)

## 2020-10-29 LAB — CBC
HCT: 36.5 % — ABNORMAL LOW (ref 39.0–52.0)
Hemoglobin: 11.9 g/dL — ABNORMAL LOW (ref 13.0–17.0)
MCH: 31.2 pg (ref 26.0–34.0)
MCHC: 32.6 g/dL (ref 30.0–36.0)
MCV: 95.5 fL (ref 80.0–100.0)
Platelets: 155 10*3/uL (ref 150–400)
RBC: 3.82 MIL/uL — ABNORMAL LOW (ref 4.22–5.81)
RDW: 13.9 % (ref 11.5–15.5)
WBC: 11.2 10*3/uL — ABNORMAL HIGH (ref 4.0–10.5)
nRBC: 0 % (ref 0.0–0.2)

## 2020-10-29 MED ORDER — LACTULOSE 10 GM/15ML PO SOLN
20.0000 g | Freq: Once | ORAL | Status: AC
  Administered 2020-10-29: 15:00:00 20 g via ORAL
  Filled 2020-10-29: qty 30

## 2020-10-29 MED ORDER — AZITHROMYCIN 500 MG PO TABS: 500.0000 mg | ORAL_TABLET | Freq: Every day | ORAL | Status: DC

## 2020-10-29 MED ORDER — AMOXICILLIN-POT CLAVULANATE 875-125 MG PO TABS
1.0000 | ORAL_TABLET | Freq: Two times a day (BID) | ORAL | Status: DC
  Administered 2020-10-29 – 2020-10-30 (×3): 1 via ORAL
  Filled 2020-10-29 (×3): qty 1

## 2020-10-29 NOTE — Progress Notes (Signed)
PROGRESS NOTE    Chris Lozano  LHT:[id number] DOB: [birth year] DOA: 10/27/2020 PCP: Adin Hector, MD    Brief Narrative:  This 80 years old male with PMH significant for coronary artery disease, PVD, aortic sclerosis, COPD, chronic hypoxic respiratory failure on 3 L of supplemental oxygen at baseline, hyperlipidemia, chronic anxiety and depression, iron deficiency anemia, chronic thrombocytopenia, hypothyroidism, BPH who was sent from PCP office with recurrent dizziness and unsteadiness on his feet for 2 weeks.  Patient reports dizziness mainly happens in the morning,  denies any falls.  Patient also reports unintentional weight loss, shortness of breath with minimal exertion, poor appetite and chronic productive cough with yellow phlegm. Work-up in the ED reveals new left lower lobe lung mass with suspected postobstructive pneumonia.  Patient is started on IV antibiotics with CAP coverage,  his oxygen requirement has increased to 4 L to maintain sats above 90%. Pulmonology consulted, may require lung biopsy.   Assessment & Plan:   Active Problems:   Dizziness  #1.  Acute on chronic respiratory failure with hypoxemia secondary to lung mass, postoperative pneumonia and COPD. Left lower lobe lung mass. Post obstructive pneumonia. Patient condition appears to be improving, Will change antibiotic to oral Augmentin. Discussed with patient daughter, she prefer patient has bronchoscopy in [facility]. Planning to discharge patient tomorrow.  2.  Alcohol use disorder. Continue CIWA, check BMP magnesium and phosphorus tomorrow.  #3.  Constipation. Patient has not had a bowel movement for the last 2 or 3 days, will give a dose of lactulose.   DVT prophylaxis: Lovenox Code Status: DNR Family Communication: Daughter updated Disposition Plan:    Status is: Inpatient  Remains inpatient appropriate because:Inpatient level of care appropriate due to severity of illness  Dispo:  Patient From:  Home  Planned Disposition: Home  Medically stable for discharge: No          I/O last 3 completed shifts: In: 330 [IV Piggyback:330] Out: 160 [Urine:160] Total I/O In: -  Out: 350 [Urine:350]     Consultants:  pulm  Procedures: None  Antimicrobials: Augmentin.  Subjective: Patient feels well today, still on 4 L oxygen, no significant short of breath.  Cough, some thick mucus. No fever chills No abdominal pain nausea vomiting.  Has not had a bowel meant for the last for 3 days. No dysuria hematuria.  Objective: Vitals:   10/28/20 2306 10/29/20 0432 10/29/20 0737 10/29/20 1130  BP: 103/67 93/66 112/66 102/67  Pulse: 65 73 73 73  Resp:   18 16  Temp: 98.7 F (37.1 C) 98.8 F (37.1 C) (!) 97.5 F (36.4 C) 97.6 F (36.4 C)  TempSrc: Oral Oral Oral   SpO2: 97% 93% 91% 93%  Weight:      Height:        Intake/Output Summary (Last 24 hours) at 10/29/2020 1400 Last data filed at 10/29/2020 0735 Gross per 24 hour  Intake 330.04 ml  Output 510 ml  Net -179.96 ml   Filed Weights   10/27/20 1457  Weight: 73.5 kg    Examination:  General exam: Appears calm and comfortable  Respiratory system: Clear to auscultation. Respiratory effort normal. Cardiovascular system: S1 & S2 heard, RRR. No JVD, murmurs, rubs, gallops or clicks. No pedal edema. Gastrointestinal system: Abdomen is nondistended, soft and nontender. No organomegaly or masses felt. Normal bowel sounds heard. Central nervous system: Alert and oriented x2. No focal neurological deficits. Extremities: Symmetric 5 x 5 power. Skin: No rashes, lesions or  ulcers Psychiatry: Judgement and insight appear normal. Mood & affect appropriate.     Data Reviewed: I have personally reviewed following labs and imaging studies  CBC: Recent Labs  Lab 10/27/20 1500 10/28/20 0336 10/29/20 0508  WBC 14.3* 13.0* 11.2*  HGB 15.1 11.7* 11.9*  HCT 46.7 35.7* 36.5*  MCV 97.7 96.5 95.5  PLT 147* 173 350   Basic  Metabolic Panel: Recent Labs  Lab 10/27/20 1500 10/28/20 0336 10/29/20 0508  NA 139 135 138  K 5.0 4.0 4.4  CL 99 99 101  CO2 31 31 33*  GLUCOSE 128* 117* 119*  BUN 14 12 12   CREATININE 1.03 0.81 0.75  CALCIUM 8.3* 7.9* 8.0*  MG  --  1.9  --    GFR: Estimated Creatinine Clearance: 76.6 mL/min (by C-G formula based on SCr of 0.75 mg/dL). Liver Function Tests: Recent Labs  Lab 10/28/20 0336  AST 24  ALT 13  ALKPHOS 69  BILITOT 0.9  PROT 5.8*  ALBUMIN 2.5*   No results for input(s): LIPASE, AMYLASE in the last 168 hours. No results for input(s): AMMONIA in the last 168 hours. Coagulation Profile: No results for input(s): INR, PROTIME in the last 168 hours. Cardiac Enzymes: No results for input(s): CKTOTAL, CKMB, CKMBINDEX, TROPONINI in the last 168 hours. BNP (last 3 results) No results for input(s): PROBNP in the last 8760 hours. HbA1C: No results for input(s): HGBA1C in the last 72 hours. CBG: No results for input(s): GLUCAP in the last 168 hours. Lipid Profile: No results for input(s): CHOL, HDL, LDLCALC, TRIG, CHOLHDL, LDLDIRECT in the last 72 hours. Thyroid Function Tests: No results for input(s): TSH, T4TOTAL, FREET4, T3FREE, THYROIDAB in the last 72 hours. Anemia Panel: No results for input(s): VITAMINB12, FOLATE, FERRITIN, TIBC, IRON, RETICCTPCT in the last 72 hours. Sepsis Labs: Recent Labs  Lab 10/27/20 1946 10/28/20 0336 10/28/20 0655  PROCALCITON  --  <0.10  --   LATICACIDVEN 0.8  --  0.9    Recent Results (from the past 240 hour(s))  Resp Panel by RT-PCR (Flu A&B, Covid) Nasopharyngeal Swab     Status: None   Collection Time: 10/27/20  8:11 PM   Specimen: Nasopharyngeal Swab; Nasopharyngeal(NP) swabs in vial transport medium  Result Value Ref Range Status   SARS Coronavirus 2 by RT PCR NEGATIVE NEGATIVE Final    Comment: (NOTE) SARS-CoV-2 target nucleic acids are NOT DETECTED.  The SARS-CoV-2 RNA is generally detectable in upper  respiratory specimens during the acute phase of infection. The lowest concentration of SARS-CoV-2 viral copies this assay can detect is 138 copies/mL. A negative result does not preclude SARS-Cov-2 infection and should not be used as the sole basis for treatment or other patient management decisions. A negative result may occur with  improper specimen collection/handling, submission of specimen other than nasopharyngeal swab, presence of viral mutation(s) within the areas targeted by this assay, and inadequate number of viral copies(<138 copies/mL). A negative result must be combined with clinical observations, patient history, and epidemiological information. The expected result is Negative.  Fact Sheet for Patients:  EntrepreneurPulse.com.au  Fact Sheet for Healthcare Providers:  IncredibleEmployment.be  This test is no t yet approved or cleared by the [place] FDA and  has been authorized for detection and/or diagnosis of SARS-CoV-2 by FDA under an Emergency Use Authorization (EUA). This EUA will remain  in effect (meaning this test can be used) for the duration of the COVID-19 declaration under Section 564(b)(1) of the Act, 21 U.S.C.section 360bbb-3(b)(1),  unless the authorization is terminated  or revoked sooner.       Influenza A by PCR NEGATIVE NEGATIVE Final   Influenza B by PCR NEGATIVE NEGATIVE Final    Comment: (NOTE) The Xpert Xpress SARS-CoV-2/FLU/RSV plus assay is intended as an aid in the diagnosis of influenza from Nasopharyngeal swab specimens and should not be used as a sole basis for treatment. Nasal washings and aspirates are unacceptable for Xpert Xpress SARS-CoV-2/FLU/RSV testing.  Fact Sheet for Patients: EntrepreneurPulse.com.au  Fact Sheet for Healthcare Providers: IncredibleEmployment.be  This test is not yet approved or cleared by the [place] FDA and has been  authorized for detection and/or diagnosis of SARS-CoV-2 by FDA under an Emergency Use Authorization (EUA). This EUA will remain in effect (meaning this test can be used) for the duration of the COVID-19 declaration under Section 564(b)(1) of the Act, 21 U.S.C. section 360bbb-3(b)(1), unless the authorization is terminated or revoked.  Performed at [facility], [address], [place], [place] [address]   Urine Culture     Status: None   Collection Time: 10/27/20  8:11 PM   Specimen: Urine, Random  Result Value Ref Range Status   Specimen Description   Final    URINE, RANDOM Performed at [facility], [address] [place], [place] [address]    Special Requests   Final    NONE Performed at [facility], [address], [place], [place] [address]    Culture   Final    NO GROWTH Performed at [facility], [place] [address] [place], [place] [address]    Report Status 10/29/2020 FINAL  Final  Culture, blood (Routine X 2) w Reflex to ID Panel     Status: None (Preliminary result)   Collection Time: 10/28/20  6:55 AM   Specimen: BLOOD RIGHT HAND  Result Value Ref Range Status   Specimen Description BLOOD RIGHT HAND  Final   Special Requests   Final    BOTTLES DRAWN AEROBIC AND ANAEROBIC Blood Culture adequate volume   Culture   Final    NO GROWTH < 24 HOURS Performed at [facility], [address], [place], [place] [address]    Report Status PENDING  Incomplete  Culture, blood (Routine X 2) w Reflex to ID Panel     Status: None (Preliminary result)   Collection Time: 10/28/20  7:02 AM   Specimen: BLOOD  Result Value Ref Range Status   Specimen Description BLOOD RIGHT WRIST  Final   Special Requests   Final    BOTTLES DRAWN AEROBIC AND ANAEROBIC Blood Culture adequate volume   Culture   Final    NO GROWTH < 24 HOURS Performed at [facility], [address] [place], [place] [address]    Report Status PENDING   Incomplete         Radiology Studies: DG Chest 2 View  Result Date: 10/27/2020 CLINICAL DATA:  Increasing dizziness, elevated white blood cell count, hypoxia EXAM: CHEST - 2 VIEW COMPARISON:  10/27/17 FINDINGS: Cardiac shadow is within normal limits. Aortic calcifications are noted. Right lung is well aerated with some mild peripheral scarring stable from the prior study. New predominately left lower lobe infiltrative density is noted as well as what appears to be a large left perihilar mass lesion in the superior segment of the left lower lobe. No bony abnormality is noted. IMPRESSION: Changes consistent with a left perihilar mass lesion in the left lower lobe with associated  post obstructive infiltrate. CT of the chest with contrast is recommended for further evaluation. Electronically Signed   By: Inez Catalina M.D.   On: 10/27/2020 18:50   CT HEAD WO CONTRAST (5MM)  Result Date: 10/27/2020 CLINICAL DATA:  Increasing dizziness over the past few weeks EXAM: CT HEAD WITHOUT CONTRAST TECHNIQUE: Contiguous axial images were obtained from the base of the skull through the vertex without intravenous contrast. COMPARISON:  11/30/2018 FINDINGS: Brain: No evidence of acute infarction, hemorrhage, hydrocephalus, extra-axial collection or mass lesion/mass effect. Mild atrophic and chronic white matter ischemic changes are noted. Vascular: No hyperdense vessel or unexpected calcification. Skull: Normal. Negative for fracture or focal lesion. Sinuses/Orbits: No acute finding. Other: None. IMPRESSION: Chronic atrophic and ischemic changes without acute abnormality. Electronically Signed   By: Inez Catalina M.D.   On: 10/27/2020 18:53   CT Chest W Contrast  Result Date: 10/27/2020 CLINICAL DATA:  Dizziness for the past few weeks. Mass versus pneumonia on radiograph. EXAM: CT CHEST WITH CONTRAST TECHNIQUE: Multidetector CT imaging of the chest was performed during intravenous contrast administration. CONTRAST:   57mL OMNIPAQUE IOHEXOL 350 MG/ML SOLN COMPARISON:  Chest radiograph earlier today. Prior chest radiographs reviewed. Most recent CT 01/14/2012 FINDINGS: Cardiovascular: Aortic atherosclerosis and tortuosity. No aortic aneurysm. There are coronary artery calcifications. The heart is normal in size. No pericardial effusion. No evidence of central pulmonary embolus, exam not tailored to pulmonary artery evaluation. Mediastinum/Nodes: Enlarged subcarinal nodes which appear confluent. For example, nodal conglomerate measuring 3 cm AP, series 2, image 75. 10 mm lower anterior paratracheal node, series 2, image 58. Additional subcentimeter mediastinal lymph nodes, including a 10 mm left paratracheal node, series 2, image 50. Low left hilar node measures 10 mm, series 2, image 72. Left lower lobe masslike consolidation extends to the hilum. No right hilar adenopathy. No supraclavicular adenopathy. The esophagus is difficult to delineate from adjacent adenopathy in the mid lower aspect. There may be esophageal wall thickening. Lungs/Pleura: Rounded masslike consolidation in the left lower lobe measures 5.9 x 4.9 x 5.8 cm, series 3, image 86. There is some central low-density but no definite necrosis or internal air. There is surrounding ill-defined opacity and ground-glass extending to the pleural surface posteriorly and the fissure. Central extent towards the hilum in the left lower lobe bronchus. Patchy and ground-glass opacities distally in the left lower lobe, difficult to delineate airspace disease versus basilar honeycombing. There is moderate emphysema. Breathing motion artifact limits detailed assessment. Subpleural reticulation is noted in the mid lower lung zone on the right. The previous left upper lobe pulmonary nodules appear to represent vascular structures on the current exam. The previous tiny right pulmonary nodules are not convincingly seen. Trace left pleural thickening in the area of consolidation. Upper  Abdomen: Motion artifact limits assessment. No visualized adrenal nodule. Cholecystectomy. Probable splenule at the splenic hilum. Musculoskeletal: Partial fusion of left lateral fifth and sixth ribs. This was also seen on prior exam. Definite focal osseous lesion, allowing for motion artifact limitations IMPRESSION: 1. Rounded masslike consolidation in the left lower lobe measuring 5.9 x 4.9 x 5.8 cm. There is surrounding ill-defined opacity and ground-glass extending to the pleural surface posteriorly and the fissure. Differential considerations include primary bronchogenic malignancy or pneumonia, given mediastinal adenopathy, malignancy is favored. Enlarged subcarinal with borderline mediastinal and left hilar lymph nodes. Consider referral to multi disciplinary thoracic conference. 2. Moderately advanced emphysema. Subpleural reticulation suggests an element of underlying interstitial lung disease. 3. Previous left upper lobe pulmonary  nodules appear to represent vascular structures on the current exam. The tiny right lung nodules on remote prior CT are not confidently seen, with current motion artifact limitations. 4. Aortic atherosclerosis and coronary artery calcifications. Aortic Atherosclerosis (ICD10-I70.0) and Emphysema (ICD10-J43.9). Electronically Signed   By: Keith Rake M.D.   On: 10/27/2020 19:42        Scheduled Meds:  arformoterol  15 mcg Nebulization Q12H   atorvastatin  80 mg Oral Daily   azithromycin  500 mg Oral Daily   enoxaparin (LOVENOX) injection  40 mg Subcutaneous QHS   feeding supplement  237 mL Oral BID BM   folic acid  1 mg Oral Daily   levothyroxine  25 mcg Oral QAC breakfast   multivitamin with minerals  1 tablet Oral Daily   thiamine  100 mg Oral Daily   Or   thiamine  100 mg Intravenous Daily   tiotropium  1 capsule Inhalation Daily   Continuous Infusions:  cefTRIAXone (ROCEPHIN)  IV Stopped (10/28/20 2143)     LOS: 2 days    Time spent: 28  minutes    Sharen Hones, MD [facility]   To contact the attending provider between 7A-7P or the covering provider during after hours 7P-7A, please log into the web site [email or web address] and access using universal [place] password for that web site. If you do not have the password, please call the hospital operator.  10/29/2020, 2:00 PM

## 2020-10-30 DIAGNOSIS — J9621 Acute and chronic respiratory failure with hypoxia: Secondary | ICD-10-CM | POA: Diagnosis not present

## 2020-10-30 DIAGNOSIS — J984 Other disorders of lung: Secondary | ICD-10-CM | POA: Diagnosis not present

## 2020-10-30 LAB — MAGNESIUM: Magnesium: 1.9 mg/dL (ref 1.7–2.4)

## 2020-10-30 LAB — BASIC METABOLIC PANEL
Anion gap: 8 (ref 5–15)
BUN: 11 mg/dL (ref 8–23)
CO2: 31 mmol/L (ref 22–32)
Calcium: 8.2 mg/dL — ABNORMAL LOW (ref 8.9–10.3)
Chloride: 99 mmol/L (ref 98–111)
Creatinine, Ser: 0.71 mg/dL (ref 0.61–1.24)
GFR, Estimated: >60 mL/min (ref >60)
Glucose, Bld: 112 mg/dL — ABNORMAL HIGH (ref 70–99)
Potassium: 4.3 mmol/L (ref 3.5–5.1)
Sodium: 138 mmol/L (ref 135–145)

## 2020-10-30 LAB — PHOSPHORUS: Phosphorus: 3.3 mg/dL (ref 2.5–4.6)

## 2020-10-30 MED ORDER — AMOXICILLIN-POT CLAVULANATE 875-125 MG PO TABS: 1.0000 | ORAL_TABLET | Freq: Two times a day (BID) | ORAL | 0 refills | Status: DC

## 2020-10-30 NOTE — Care Management Important Message (Signed)
Important Message  Patient Details  Name: Chris Lozano MRN: [id number] Date of Birth: [birth year]   Medicare Important Message Given:  N/A - LOS <3 / Initial given by admissions     Juliann Pulse A [name] 10/30/2020, 9:52 AM

## 2020-10-30 NOTE — Discharge Summary (Signed)
Physician Discharge Summary  Patient ID: Chris Lozano MRN: [id number] DOB/AGE: 1940/05/28 80 y.o.  Admit date: 10/27/2020 Discharge date: 10/30/2020  Admission Diagnoses:  Discharge Diagnoses:  Active Problems:   Acute on chronic respiratory failure with hypoxia (HCC)   Dizziness   Cavitating mass in left lower lung lobe   Discharged Condition: fair  Hospital Course:  This 80 years old male with PMH significant for coronary artery disease, PVD, aortic sclerosis, COPD, chronic hypoxic respiratory failure on 3 L of supplemental oxygen at baseline, hyperlipidemia, chronic anxiety and depression, iron deficiency anemia, chronic thrombocytopenia, hypothyroidism, BPH who was sent from PCP office with recurrent dizziness and unsteadiness on his feet for 2 weeks.  Patient reports dizziness mainly happens in the morning,  denies any falls.  Patient also reports unintentional weight loss, shortness of breath with minimal exertion, poor appetite and chronic productive cough with yellow phlegm. Work-up in the ED reveals new left lower lobe lung mass with suspected postobstructive pneumonia.  Patient is started on IV antibiotics with CAP coverage,  his oxygen requirement has increased to 4 L to maintain sats above 90%. Pulmonology consulted, may require lung biopsy.  #1.  Acute on chronic respiratory failure with hypoxemia secondary to lung mass, postoperative pneumonia and COPD. Left lower lobe lung mass. Post obstructive pneumonia. Patient condition appears to be improving, Discussed with Dr. Raul Del yesterday, patient will have bronchoscopy and biopsy in [facility], which is preferred by patient daughter. Patient condition appears to be improving, antibiotic changed to Augmentin for possible post obstructive pneumonia.  Will continue for 2 weeks course. Patient also on 4 L oxygen, he was on 3 L oxygen before admission, now probably he will need 4 L because of new lung mass.  2.  Alcohol use disorder. No  alcohol withdrawal.   #3.  Constipation. Resolved after giving lactulose   Consults: pulmonary/intensive care  Significant Diagnostic Studies:   CT CHEST WITH CONTRAST   TECHNIQUE: Multidetector CT imaging of the chest was performed during intravenous contrast administration.   CONTRAST:  81mL OMNIPAQUE IOHEXOL 350 MG/ML SOLN   COMPARISON:  Chest radiograph earlier today. Prior chest radiographs reviewed. Most recent CT 01/14/2012   FINDINGS: Cardiovascular: Aortic atherosclerosis and tortuosity. No aortic aneurysm. There are coronary artery calcifications. The heart is normal in size. No pericardial effusion. No evidence of central pulmonary embolus, exam not tailored to pulmonary artery evaluation.   Mediastinum/Nodes: Enlarged subcarinal nodes which appear confluent. For example, nodal conglomerate measuring 3 cm AP, series 2, image 75. 10 mm lower anterior paratracheal node, series 2, image 58. Additional subcentimeter mediastinal lymph nodes, including a 10 mm left paratracheal node, series 2, image 50. Low left hilar node measures 10 mm, series 2, image 72. Left lower lobe masslike consolidation extends to the hilum. No right hilar adenopathy. No supraclavicular adenopathy. The esophagus is difficult to delineate from adjacent adenopathy in the mid lower aspect. There may be esophageal wall thickening.   Lungs/Pleura: Rounded masslike consolidation in the left lower lobe measures 5.9 x 4.9 x 5.8 cm, series 3, image 86. There is some central low-density but no definite necrosis or internal air. There is surrounding ill-defined opacity and ground-glass extending to the pleural surface posteriorly and the fissure. Central extent towards the hilum in the left lower lobe bronchus. Patchy and ground-glass opacities distally in the left lower lobe, difficult to delineate airspace disease versus basilar honeycombing. There is moderate emphysema. Breathing motion artifact  limits detailed assessment. Subpleural reticulation is noted in  the mid lower lung zone on the right. The previous left upper lobe pulmonary nodules appear to represent vascular structures on the current exam. The previous tiny right pulmonary nodules are not convincingly seen. Trace left pleural thickening in the area of consolidation.   Upper Abdomen: Motion artifact limits assessment. No visualized adrenal nodule. Cholecystectomy. Probable splenule at the splenic hilum.   Musculoskeletal: Partial fusion of left lateral fifth and sixth ribs. This was also seen on prior exam. Definite focal osseous lesion, allowing for motion artifact limitations   IMPRESSION: 1. Rounded masslike consolidation in the left lower lobe measuring 5.9 x 4.9 x 5.8 cm. There is surrounding ill-defined opacity and ground-glass extending to the pleural surface posteriorly and the fissure. Differential considerations include primary bronchogenic malignancy or pneumonia, given mediastinal adenopathy, malignancy is favored. Enlarged subcarinal with borderline mediastinal and left hilar lymph nodes. Consider referral to multi disciplinary thoracic conference. 2. Moderately advanced emphysema. Subpleural reticulation suggests an element of underlying interstitial lung disease. 3. Previous left upper lobe pulmonary nodules appear to represent vascular structures on the current exam. The tiny right lung nodules on remote prior CT are not confidently seen, with current motion artifact limitations. 4. Aortic atherosclerosis and coronary artery calcifications.   Aortic Atherosclerosis (ICD10-I70.0) and Emphysema (ICD10-J43.9).     Electronically Signed   By: Keith Rake M.D.   On: 10/27/2020 19:42      Treatments: Antibiotics  Discharge Exam: Blood pressure 104/65, pulse 73, temperature 98.4 F (36.9 C), temperature source Oral, resp. rate 16, height 6' (1.829 m), weight 73.5 kg, SpO2 95 %. General  appearance: alert, cooperative, and Oriented x2 Resp: crackles in left lower field Cardio: regular rate and rhythm, S1, S2 normal, no murmur, click, rub or gallop GI: soft, non-tender; bowel sounds normal; no masses,  no organomegaly Extremities: extremities normal, atraumatic, no cyanosis or edema  Disposition: Discharge disposition: 01-Home or Self Care       Discharge Instructions     Diet - low sodium heart healthy   Complete by: As directed    Increase activity slowly   Complete by: As directed       Allergies as of 10/30/2020       Reactions   Fluocinolone Other (See Comments)   Reaction: dizzines, "drunk" Other reaction(s): Unknown   Ciprofloxacin Hcl    Dizzy         Medication List     STOP taking these medications    budesonide 0.5 MG/2ML nebulizer solution Commonly known as: PULMICORT   loratadine 10 MG tablet Commonly known as: CLARITIN   Pfizer-BioNT COVID-19 Vac-TriS Susp injection Generic drug: COVID-19 mRNA Vac-TriS [employer])       TAKE these medications    albuterol (2.5 MG/3ML) 0.083% nebulizer solution Commonly known as: PROVENTIL Take 2.5 mg by nebulization every 6 (six) hours as needed for wheezing or shortness of breath.   albuterol 108 (90 Base) MCG/ACT inhaler Commonly known as: VENTOLIN HFA Inhale 2 puffs into the lungs every 6 (six) hours as needed for wheezing or shortness of breath.   amoxicillin-clavulanate 875-125 MG tablet Commonly known as: AUGMENTIN Take 1 tablet by mouth every 12 (twelve) hours for 12 days.   aspirin EC 81 MG tablet Take 81 mg by mouth daily.   atorvastatin 80 MG tablet Commonly known as: LIPITOR Take 80 mg by mouth daily.   carvedilol 3.125 MG tablet Commonly known as: COREG Take 3.125 mg by mouth 2 (two) times daily.   formoterol 20  MCG/2ML nebulizer solution Commonly known as: PERFOROMIST Inhale 20 mcg into the lungs 2 (two) times daily.   gabapentin 100 MG capsule Commonly known as:  NEURONTIN Take 100 mg by mouth daily.   levothyroxine 25 MCG tablet Commonly known as: SYNTHROID Take 25 mcg by mouth daily before breakfast.   mirtazapine 15 MG tablet Commonly known as: REMERON Take 15 mg by mouth at bedtime.   oxybutynin 5 MG 24 hr tablet Commonly known as: DITROPAN-XL Take 5 mg by mouth daily.   tamsulosin 0.4 MG Caps capsule Commonly known as: FLOMAX Take 0.8 mg by mouth daily.   Tiotropium Bromide Monohydrate 2.5 MCG/ACT Aers Inhale 2 puffs into the lungs daily.   VITAMIN D3 PO Take 1 tablet by mouth daily.   ZINC PO Take 1 tablet by mouth daily.        Follow-up Information     Tama High III, MD Follow up in 1 week(s).   Specialty: Internal Medicine Contact information: [place] [id number] [phone]                31 minutes Signed: Sharen Hones 10/30/2020, 9:18 AM

## 2020-10-30 NOTE — TOC Progression Note (Signed)
Transition of Care [facility]) - Progression Note    Patient Details  Name: Chris Lozano MRN: [id number] Date of Birth: [birth year]  Transition of Care [facility]) CM/SW Contact  Shelbie Hutching, RN Phone Number: 10/30/2020, 10:26 AM  Clinical Narrative:    [facility] acknowledged consult.  Patient is not having any signs of alcohol withdrawal and reported only minimal alcohol intake at admission.  Consult completed.          Expected Discharge Plan and Services           Expected Discharge Date: 10/30/20                                     Social Determinants of Health (SDOH) Interventions    Readmission Risk Interventions No flowsheet data found.

## 2020-11-02 LAB — CULTURE, BLOOD (ROUTINE X 2)
Culture: NO GROWTH
Culture: NO GROWTH
Special Requests: ADEQUATE
Special Requests: ADEQUATE

## 2020-11-03 ENCOUNTER — Inpatient Hospital Stay
Admission: EM | Admit: 2020-11-03 | Discharge: 2020-11-07 | DRG: 189 | Disposition: A | Discharge status: Home Health | Payer: Medicare Other | Attending: Internal Medicine | Admitting: Internal Medicine

## 2020-11-03 ENCOUNTER — Other Ambulatory Visit: Payer: Self-pay

## 2020-11-03 ENCOUNTER — Encounter: Payer: Self-pay | Admitting: Emergency Medicine

## 2020-11-03 ENCOUNTER — Emergency Department: Payer: Medicare Other

## 2020-11-03 DIAGNOSIS — J9601 Acute respiratory failure with hypoxia: Secondary | ICD-10-CM | POA: Diagnosis not present

## 2020-11-03 DIAGNOSIS — J9602 Acute respiratory failure with hypercapnia: Secondary | ICD-10-CM | POA: Diagnosis present

## 2020-11-03 DIAGNOSIS — C3432 Malignant neoplasm of lower lobe, left bronchus or lung: Secondary | ICD-10-CM | POA: Diagnosis present

## 2020-11-03 DIAGNOSIS — J440 Chronic obstructive pulmonary disease with acute lower respiratory infection: Secondary | ICD-10-CM | POA: Diagnosis present

## 2020-11-03 DIAGNOSIS — Z79899 Other long term (current) drug therapy: Secondary | ICD-10-CM

## 2020-11-03 DIAGNOSIS — K219 Gastro-esophageal reflux disease without esophagitis: Secondary | ICD-10-CM | POA: Diagnosis present

## 2020-11-03 DIAGNOSIS — J189 Pneumonia, unspecified organism: Secondary | ICD-10-CM | POA: Diagnosis not present

## 2020-11-03 DIAGNOSIS — E785 Hyperlipidemia, unspecified: Secondary | ICD-10-CM | POA: Diagnosis present

## 2020-11-03 DIAGNOSIS — J9622 Acute and chronic respiratory failure with hypercapnia: Principal | ICD-10-CM

## 2020-11-03 DIAGNOSIS — Z23 Encounter for immunization: Secondary | ICD-10-CM | POA: Diagnosis present

## 2020-11-03 DIAGNOSIS — J181 Lobar pneumonia, unspecified organism: Secondary | ICD-10-CM | POA: Diagnosis present

## 2020-11-03 DIAGNOSIS — J984 Other disorders of lung: Secondary | ICD-10-CM

## 2020-11-03 DIAGNOSIS — R2681 Unsteadiness on feet: Secondary | ICD-10-CM | POA: Diagnosis present

## 2020-11-03 DIAGNOSIS — D649 Anemia, unspecified: Secondary | ICD-10-CM | POA: Diagnosis present

## 2020-11-03 DIAGNOSIS — Z79890 Hormone replacement therapy (postmenopausal): Secondary | ICD-10-CM | POA: Diagnosis not present

## 2020-11-03 DIAGNOSIS — R918 Other nonspecific abnormal finding of lung field: Secondary | ICD-10-CM | POA: Diagnosis present

## 2020-11-03 DIAGNOSIS — Z20822 Contact with and (suspected) exposure to covid-19: Secondary | ICD-10-CM | POA: Diagnosis present

## 2020-11-03 DIAGNOSIS — J9620 Acute and chronic respiratory failure, unspecified whether with hypoxia or hypercapnia: Secondary | ICD-10-CM | POA: Insufficient documentation

## 2020-11-03 DIAGNOSIS — Z888 Allergy status to other drugs, medicaments and biological substances status: Secondary | ICD-10-CM

## 2020-11-03 DIAGNOSIS — Z8701 Personal history of pneumonia (recurrent): Secondary | ICD-10-CM

## 2020-11-03 DIAGNOSIS — Z87891 Personal history of nicotine dependence: Secondary | ICD-10-CM

## 2020-11-03 DIAGNOSIS — Z66 Do not resuscitate: Secondary | ICD-10-CM | POA: Diagnosis present

## 2020-11-03 DIAGNOSIS — N400 Enlarged prostate without lower urinary tract symptoms: Secondary | ICD-10-CM | POA: Diagnosis present

## 2020-11-03 DIAGNOSIS — I10 Essential (primary) hypertension: Secondary | ICD-10-CM | POA: Diagnosis present

## 2020-11-03 DIAGNOSIS — J441 Chronic obstructive pulmonary disease with (acute) exacerbation: Secondary | ICD-10-CM

## 2020-11-03 DIAGNOSIS — G4733 Obstructive sleep apnea (adult) (pediatric): Secondary | ICD-10-CM | POA: Diagnosis present

## 2020-11-03 DIAGNOSIS — C3492 Malignant neoplasm of unspecified part of left bronchus or lung: Secondary | ICD-10-CM

## 2020-11-03 DIAGNOSIS — I2510 Atherosclerotic heart disease of native coronary artery without angina pectoris: Secondary | ICD-10-CM | POA: Diagnosis present

## 2020-11-03 DIAGNOSIS — G4730 Sleep apnea, unspecified: Secondary | ICD-10-CM | POA: Diagnosis present

## 2020-11-03 DIAGNOSIS — J9621 Acute and chronic respiratory failure with hypoxia: Secondary | ICD-10-CM | POA: Diagnosis present

## 2020-11-03 DIAGNOSIS — Z9981 Dependence on supplemental oxygen: Secondary | ICD-10-CM

## 2020-11-03 DIAGNOSIS — J9690 Respiratory failure, unspecified, unspecified whether with hypoxia or hypercapnia: Secondary | ICD-10-CM | POA: Diagnosis present

## 2020-11-03 LAB — COMPREHENSIVE METABOLIC PANEL
ALT: 29 U/L (ref 0–44)
AST: 32 U/L (ref 15–41)
Albumin: 3 g/dL — ABNORMAL LOW (ref 3.5–5.0)
Alkaline Phosphatase: 89 U/L (ref 38–126)
Anion gap: 5 (ref 5–15)
BUN: 15 mg/dL (ref 8–23)
CO2: 37 mmol/L — ABNORMAL HIGH (ref 22–32)
Calcium: 8.1 mg/dL — ABNORMAL LOW (ref 8.9–10.3)
Chloride: 96 mmol/L — ABNORMAL LOW (ref 98–111)
Creatinine, Ser: 0.9 mg/dL (ref 0.61–1.24)
GFR, Estimated: >60 mL/min (ref >60)
Glucose, Bld: 157 mg/dL — ABNORMAL HIGH (ref 70–99)
Potassium: 4.5 mmol/L (ref 3.5–5.1)
Sodium: 138 mmol/L (ref 135–145)
Total Bilirubin: 1 mg/dL (ref 0.3–1.2)
Total Protein: 7.1 g/dL (ref 6.5–8.1)

## 2020-11-03 LAB — CBC WITH DIFFERENTIAL/PLATELET
Abs Immature Granulocytes: 0.1 10*3/uL — ABNORMAL HIGH (ref 0.00–0.07)
Basophils Absolute: 0.1 10*3/uL (ref 0.0–0.1)
Basophils Relative: 1 %
Eosinophils Absolute: 0.2 10*3/uL (ref 0.0–0.5)
Eosinophils Relative: 1 %
HCT: 41.6 % (ref 39.0–52.0)
Hemoglobin: 13.1 g/dL (ref 13.0–17.0)
Immature Granulocytes: 1 %
Lymphocytes Relative: 12 %
Lymphs Abs: 1.6 10*3/uL (ref 0.7–4.0)
MCH: 31.3 pg (ref 26.0–34.0)
MCHC: 31.5 g/dL (ref 30.0–36.0)
MCV: 99.5 fL (ref 80.0–100.0)
Monocytes Absolute: 0.7 10*3/uL (ref 0.1–1.0)
Monocytes Relative: 6 %
Neutro Abs: 10.5 10*3/uL — ABNORMAL HIGH (ref 1.7–7.7)
Neutrophils Relative %: 79 %
Platelets: 206 10*3/uL (ref 150–400)
RBC: 4.18 MIL/uL — ABNORMAL LOW (ref 4.22–5.81)
RDW: 14.2 % (ref 11.5–15.5)
WBC: 13.1 10*3/uL — ABNORMAL HIGH (ref 4.0–10.5)
nRBC: 0 % (ref 0.0–0.2)

## 2020-11-03 LAB — RESP PANEL BY RT-PCR (FLU A&B, COVID) ARPGX2
Influenza A by PCR: NEGATIVE
Influenza B by PCR: NEGATIVE
SARS Coronavirus 2 by RT PCR: NEGATIVE

## 2020-11-03 LAB — LACTIC ACID, PLASMA
Lactic Acid, Venous: 1 mmol/L (ref 0.5–1.9)
Lactic Acid, Venous: 1.1 mmol/L (ref 0.5–1.9)

## 2020-11-03 LAB — BRAIN NATRIURETIC PEPTIDE: B Natriuretic Peptide: 72.8 pg/mL (ref 0.0–100.0)

## 2020-11-03 LAB — EXPECTORATED SPUTUM ASSESSMENT W GRAM STAIN, RFLX TO RESP C

## 2020-11-03 LAB — TROPONIN I (HIGH SENSITIVITY)
Troponin I (High Sensitivity): 12 ng/L (ref <18)
Troponin I (High Sensitivity): 10 ng/L (ref <18)

## 2020-11-03 MED ORDER — METHYLPREDNISOLONE SODIUM SUCC 125 MG IJ SOLR
125.0000 mg | Freq: Once | INTRAMUSCULAR | Status: AC
  Administered 2020-11-03: 125 mg via INTRAVENOUS
  Filled 2020-11-03: qty 2

## 2020-11-03 MED ORDER — METHYLPREDNISOLONE SODIUM SUCC 125 MG IJ SOLR
125.0000 mg | Freq: Two times a day (BID) | INTRAMUSCULAR | Status: DC
  Administered 2020-11-03: 125 mg via INTRAVENOUS
  Filled 2020-11-03: qty 2

## 2020-11-03 MED ORDER — PIPERACILLIN-TAZOBACTAM 3.375 G IVPB 30 MIN
3.3750 g | Freq: Once | INTRAVENOUS | Status: AC
  Administered 2020-11-03: 3.375 g via INTRAVENOUS
  Filled 2020-11-03: qty 50

## 2020-11-03 MED ORDER — IPRATROPIUM-ALBUTEROL 0.5-2.5 (3) MG/3ML IN SOLN
3.0000 mL | Freq: Once | RESPIRATORY_TRACT | Status: AC
  Administered 2020-11-03: 3 mL via RESPIRATORY_TRACT
  Filled 2020-11-03: qty 3

## 2020-11-03 MED ORDER — VANCOMYCIN HCL 1500 MG/300ML IV SOLN
1500.0000 mg | INTRAVENOUS | Status: DC
  Administered 2020-11-03 – 2020-11-04 (×2): 1500 mg via INTRAVENOUS
  Filled 2020-11-03 (×3): qty 300

## 2020-11-03 MED ORDER — LACTATED RINGERS IV SOLN: INTRAVENOUS | Status: DC

## 2020-11-03 MED ORDER — SODIUM CHLORIDE 0.9 % IV SOLN
2.0000 g | Freq: Two times a day (BID) | INTRAVENOUS | Status: DC
  Administered 2020-11-03 – 2020-11-04 (×3): 2 g via INTRAVENOUS
  Filled 2020-11-03 (×4): qty 2

## 2020-11-03 MED ORDER — ONDANSETRON HCL 4 MG PO TABS: 4.0000 mg | ORAL_TABLET | Freq: Four times a day (QID) | ORAL | Status: DC | PRN

## 2020-11-03 MED ORDER — ONDANSETRON HCL 4 MG/2ML IJ SOLN: 4.0000 mg | Freq: Four times a day (QID) | INTRAMUSCULAR | Status: DC | PRN

## 2020-11-03 MED ORDER — ALBUTEROL SULFATE (2.5 MG/3ML) 0.083% IN NEBU
2.5000 mg | INHALATION_SOLUTION | Freq: Once | RESPIRATORY_TRACT | Status: AC
  Administered 2020-11-03: 2.5 mg via RESPIRATORY_TRACT
  Filled 2020-11-03: qty 3

## 2020-11-03 MED ORDER — ENOXAPARIN SODIUM 40 MG/0.4ML IJ SOSY
40.0000 mg | PREFILLED_SYRINGE | INTRAMUSCULAR | Status: DC
  Administered 2020-11-03 – 2020-11-06 (×4): 40 mg via SUBCUTANEOUS
  Filled 2020-11-03 (×4): qty 0.4

## 2020-11-03 MED ORDER — VANCOMYCIN HCL 1500 MG/300ML IV SOLN
1500.0000 mg | Freq: Once | INTRAVENOUS | Status: DC
  Filled 2020-11-03: qty 300

## 2020-11-03 NOTE — ED Notes (Signed)
Pt placed on BiPAP at this time by RT

## 2020-11-03 NOTE — ED Provider Notes (Addendum)
[facility] Emergency Department Provider Note  ____________________________________________   Event Date/Time   First MD Initiated Contact with Patient 11/03/20 1647     (approximate)  I have reviewed the triage vital signs and the nursing notes.   HISTORY  Chief Complaint Shortness of Breath    HPI Chris Lozano is a 80 y.o. male with history of COPD, hyperlipidemia, GERD, CAD, status post recent admission for pneumonia here with shortness of breath.  History is somewhat limited due to mild drowsiness and fatigue on arrival.  He reports that he had been feeling somewhat well although he has felt very weak and shortness of breath throughout the day today.  He has not been giving himself breathing treatments regularly at home.  He states he just feels too weak to do this.  Does not recall if he had any fevers.  He said ongoing sputum production.  Denies any chest pain.  Denies any leg swelling.  Symptoms feel somewhat better after receiving DuoNeb with EMS.  He was found to be in the 80s on his 4 L per EMS.    Past Medical History:  Diagnosis Date   Asthma    BPH (benign prostatic hyperplasia)    CAD (coronary artery disease)    COPD (chronic obstructive pulmonary disease) (HCC)    GERD (gastroesophageal reflux disease)    HLD (hyperlipidemia)    Sleep apnea     Patient Active Problem List   Diagnosis Date Noted   Cavitating mass in left lower lung lobe 10/29/2020   Dizziness 10/27/2020   Acute respiratory failure with hypoxia and hypercapnia ([place]) 11/28/2016   COPD with acute exacerbation ([place]) 11/28/2016   CAD (coronary artery disease) 11/28/2016   HLD (hyperlipidemia) 11/28/2016   Sleep apnea 11/28/2016   GERD (gastroesophageal reflux disease) 11/28/2016   Acute on chronic respiratory failure with hypoxia ([place]) 11/28/2016    Past Surgical History:  Procedure Laterality Date   ADENOIDECTOMY     CARDIAC CATHETERIZATION     GALLBLADDER  SURGERY     LEFT HEART CATH AND CORONARY ANGIOGRAPHY Left 01/10/2019   Procedure: LEFT HEART CATH AND CORONARY ANGIOGRAPHY;  Surgeon: Teodoro Spray, MD;  Location: [place] [facility];  Service: Cardiovascular;  Laterality: Left;   TONSILLECTOMY      Prior to Admission medications   Medication Sig Start Date End Date Taking? Authorizing Provider  albuterol (PROVENTIL HFA;VENTOLIN HFA) 108 (90 Base) MCG/ACT inhaler Inhale 2 puffs into the lungs every 6 (six) hours as needed for wheezing or shortness of breath.  12/12/13   [provider]  albuterol (PROVENTIL) (2.5 MG/3ML) 0.083% nebulizer solution Take 2.5 mg by nebulization every 6 (six) hours as needed for wheezing or shortness of breath.    [provider]  amoxicillin-clavulanate (AUGMENTIN) 875-125 MG tablet Take 1 tablet by mouth every 12 (twelve) hours for 12 days. 10/30/20 11/11/20  Sharen Hones, MD  aspirin EC 81 MG tablet Take 81 mg by mouth daily.    [provider]  atorvastatin (LIPITOR) 80 MG tablet Take 80 mg by mouth daily.    [provider]  carvedilol (COREG) 3.125 MG tablet Take 3.125 mg by mouth 2 (two) times daily. 05/05/20   [provider]  Cholecalciferol (VITAMIN D3 PO) Take 1 tablet by mouth daily.    [provider]  formoterol (PERFOROMIST) 20 MCG/2ML nebulizer solution Inhale 20 mcg into the lungs 2 (two) times daily.  11/16/17   [provider]  gabapentin (NEURONTIN) 100 MG capsule Take 100 mg by mouth daily.  12/21/17 12/28/19  [provider]  levothyroxine (SYNTHROID) 25 MCG tablet Take 25 mcg by mouth daily before breakfast. 12/15/18   [provider]  mirtazapine (REMERON) 15 MG tablet Take 15 mg by mouth at bedtime. 09/01/20   [provider]  Multiple Vitamins-Minerals (ZINC PO) Take 1 tablet by mouth daily.    [provider]  oxybutynin (DITROPAN-XL) 5 MG 24 hr tablet Take 5 mg by mouth daily. 08/27/20   [provider]  tamsulosin (FLOMAX) 0.4 MG CAPS capsule Take 0.8 mg by mouth daily. 08/27/20   [provider]  Tiotropium Bromide Monohydrate 2.5 MCG/ACT AERS Inhale 2 puffs into the lungs daily.    [provider]    Allergies Fluocinolone and Ciprofloxacin hcl  Family History  Problem Relation Age of Onset   Heart disease Other    Kidney disease Neg Hx    Prostate cancer Neg Hx    Kidney cancer Neg Hx    Bladder Cancer Neg Hx     Social History Social History   Tobacco Use   Smoking status: Former   Smokeless tobacco: Former    Types: Chew   Tobacco comments:    as a teenager  Scientific laboratory technician Use: Never used  Substance Use Topics   Alcohol use: Yes    Alcohol/week: 0.0 standard drinks    Comment: occasionally   Drug use: No    Review of Systems  Review of Systems  Constitutional:  Positive for fatigue. Negative for chills and fever.  HENT:  Negative for sore throat.   Respiratory:  Positive for cough and shortness of breath.   Cardiovascular:  Negative for chest pain.  Gastrointestinal:  Negative for abdominal pain.  Genitourinary:  Negative for flank pain.  Musculoskeletal:  Negative for neck pain.  Skin:  Negative for rash and wound.  Allergic/Immunologic: Negative for immunocompromised state.  Neurological:  Positive for weakness. Negative for numbness.  Hematological:  Does not bruise/bleed easily.  All other systems reviewed and are negative.   ____________________________________________  PHYSICAL EXAM:      VITAL SIGNS: ED Triage Vitals  Enc Vitals Group     BP 11/03/20 1649 121/64     Pulse Rate 11/03/20 1649 87     Resp 11/03/20 1649 (!) 28     Temp 11/03/20 1649 98.1 F (36.7 C)     Temp Source 11/03/20 1649 Oral     SpO2 11/03/20 1643 96 %     Weight --      Height --      Head Circumference --      Peak Flow --      Pain Score 11/03/20 1649 0     Pain Loc --      Pain Edu? --      Excl. in [place]? --       Physical Exam Vitals and nursing note reviewed.  Constitutional:      General: He is not in acute distress.    Appearance: He is well-developed.  HENT:     Head: Normocephalic and atraumatic.  Eyes:     Conjunctiva/sclera: Conjunctivae normal.  Cardiovascular:     Rate and Rhythm: Normal rate and regular rhythm.     Heart sounds: Normal heart sounds. No murmur heard.   No friction rub.  Pulmonary:     Effort: Pulmonary effort is normal. Tachypnea present. No respiratory distress.  Breath sounds: Decreased breath sounds, wheezing and rhonchi present. No rales.  Abdominal:     General: There is no distension.     Palpations: Abdomen is soft.     Tenderness: There is no abdominal tenderness.  Musculoskeletal:     Cervical back: Neck supple.  Skin:    General: Skin is warm.     Capillary Refill: Capillary refill takes less than 2 seconds.  Neurological:     Mental Status: He is alert and oriented to person, place, and time.     Motor: No abnormal muscle tone.      ____________________________________________   LABS (all labs ordered are listed, but only abnormal results are displayed)  Labs Reviewed  CBC WITH DIFFERENTIAL/PLATELET - Abnormal; Notable for the following components:      Result Value   WBC 13.1 (*)    RBC 4.18 (*)    Neutro Abs 10.5 (*)    Abs Immature Granulocytes 0.10 (*)    All other components within normal limits  COMPREHENSIVE METABOLIC PANEL - Abnormal; Notable for the following components:   Chloride 96 (*)    CO2 37 (*)    Glucose, Bld 157 (*)    Calcium 8.1 (*)    Albumin 3.0 (*)    All other components within normal limits  BLOOD GAS, VENOUS - Abnormal; Notable for the following components:   pCO2, Ven 88 (*)    Bicarbonate 39.5 (*)    Acid-Base Excess 9.0 (*)    All other components within normal limits  CULTURE, BLOOD (ROUTINE X 2)  CULTURE, BLOOD (ROUTINE X 2)  EXPECTORATED SPUTUM ASSESSMENT W GRAM STAIN, RFLX TO RESP C   LACTIC ACID, PLASMA  BRAIN NATRIURETIC PEPTIDE  LACTIC ACID, PLASMA  TROPONIN I (HIGH SENSITIVITY)  TROPONIN I (HIGH SENSITIVITY)    ____________________________________________  EKG: Sinus rhythm, ventricular rate 88.  PR 217, QRS 126, QTc 425.  No acute ST elevations or depressions.  Right bundle branch block.   ________________________________________  RADIOLOGY All imaging, including plain films, CT scans, and ultrasounds, independently reviewed by me, and interpretations confirmed via formal radiology reads.  ED MD interpretation:   Chest x-ray:Persistent masslike consolidation of L hilum, increasing L lung consolidation, developing R basilar consolidation  Official radiology report(s): DG Chest Portable 1 View  Result Date: 11/03/2020 CLINICAL DATA:  Short of breath, COPD EXAM: PORTABLE CHEST 1 VIEW COMPARISON:  10/27/2020 FINDINGS: Single frontal view of the chest demonstrates stable masslike consolidation at the left hilum. There is increasing consolidation within the left lung, with underlying volume loss. Background emphysema is again noted. Developing consolidation at the right lung base may be hypoventilatory. No large effusion or pneumothorax. IMPRESSION: 1. Persistent masslike consolidation at the left hilum, concerning for malignancy based on prior CT 10/27/2020. 2. Increasing left lung consolidation, which could reflect postobstructive change. 3. Developing right basilar consolidation, favor atelectasis. 4. Stable background emphysema. Electronically Signed   By: Randa Ngo M.D.   On: 11/03/2020 18:15    ____________________________________________  PROCEDURES   Procedure(s) performed (including Critical Care):  .Critical Care Performed by: Duffy Bruce, MD Authorized by: Duffy Bruce, MD   Critical care provider statement:    Critical care time (minutes):  35   Critical care time was exclusive of:  Separately billable procedures and treating other  patients and teaching time   Critical care was necessary to treat or prevent imminent or life-threatening deterioration of the following conditions:  Cardiac failure, circulatory failure and respiratory failure  Critical care was time spent personally by me on the following activities:  Development of treatment plan with patient or surrogate, discussions with consultants, evaluation of patient's response to treatment, examination of patient, obtaining history from patient or surrogate, ordering and performing treatments and interventions, ordering and review of laboratory studies, ordering and review of radiographic studies, pulse oximetry, re-evaluation of patient's condition and review of old charts   I assumed direction of critical care for this patient from another provider in my specialty: no    ____________________________________________  INITIAL IMPRESSION / MDM / [place] / ED COURSE  As part of my medical decision making, I reviewed the following data within the [place] notes reviewed and incorporated, Old chart reviewed, Notes from prior ED visits, and [place] Controlled Substance [place] was evaluated in Emergency Department on 11/03/2020 for the symptoms described in the history of present illness. He was evaluated in the context of the global COVID-19 pandemic, which necessitated consideration that the patient might be at risk for infection with the SARS-CoV-2 virus that causes COVID-19. Institutional protocols and algorithms that pertain to the evaluation of patients at risk for COVID-19 are in a state of rapid change based on information released by regulatory bodies including the CDC and federal and state organizations. These policies and algorithms were followed during the patient's care in the ED.  Some ED evaluations and interventions may be delayed as a result of limited staffing during the pandemic.*     Medical Decision  Making: 80 year-old male here with altered mental status, shortness of breath.  Patient was just hospitalized for pneumonia.  He also was found to have a lung mass with likely obstruction.  On my assessment, patient is drowsy, diffusely wheezy with markedly diminished aeration.  Blood gas shows acute on chronic hypercapnia which I suspect is contributing to his drowsiness and altered mental status.  Differential includes ongoing pneumonia and CBC does show a moderate leukocytosis.  Chest x-ray reviewed, shows possible increasing postobstructive change.  Will cover empirically, start on steroids and duo nebs.  He is placed on BiPAP.  He is DNR.  He seems to be improving clinically.  Will admit to medicine.  ____________________________________________  FINAL CLINICAL IMPRESSION(S) / ED DIAGNOSES  Final diagnoses:  Acute on chronic respiratory failure with hypercapnia (HCC)  COPD exacerbation (HCC)     MEDICATIONS GIVEN DURING THIS VISIT:  Medications  ipratropium-albuterol (DUONEB) 0.5-2.5 (3) MG/3ML nebulizer solution 3 mL (3 mLs Nebulization Given 11/03/20 1748)  ipratropium-albuterol (DUONEB) 0.5-2.5 (3) MG/3ML nebulizer solution 3 mL (3 mLs Nebulization Given 11/03/20 1747)  methylPREDNISolone sodium succinate (SOLU-MEDROL) 125 mg/2 mL injection 125 mg (125 mg Intravenous Given 11/03/20 1753)     ED Discharge Orders     None        Note:  This document was prepared using Dragon voice recognition software and may include unintentional dictation errors.   Duffy Bruce, MD 11/03/20 Pollie Meyer    Duffy Bruce, MD 11/03/20 [phone]

## 2020-11-03 NOTE — ED Notes (Signed)
Assumed care of patient : First contact.   This RN at bedside to draw labs, reposition patient to position of comfort & optimal resp drive. Pt remains on Bipap- no longer appears to be in resp distress. Breathing with Bipap.   Given warm blankets, family updated to plan of care.   Call light in reach. Denies any other needs at this time.

## 2020-11-03 NOTE — ED Notes (Signed)
RT called for BiPAP

## 2020-11-03 NOTE — H&P (Signed)
History and Physical   BARRON VANLOAN CWU:[id number] DOB: 05-Jul-1940 DOA: 11/03/2020  Referring MD/NP/PA: Dr. Ellender Hose  PCP: Adin Hector, MD   Outpatient Specialists: Pulmonology at [facility]  Patient coming from: Home  Chief Complaint: Shortness of breath  HPI: Chris Lozano is a 80 y.o. male with medical history significant of COPD, coronary artery disease, GERD, hyperlipidemia, obstructive sleep apnea and asthma with recent diagnosis of lung mass who is awaiting work-up at [facility] for possible lung cancer.  Patient was recently diagnosed with pneumonia and was in the hospital on treated.  He was discharged home but for family he was not on steroids on the antibiotics.  He started having worsening shortness of breath after arriving home.  Was brought back by family secondary to worsening shortness of breath respiratory failure and currently on BiPAP.  History obtained from wife and daughter at bedside.  He is currently sleepy.  He is too weak.  No chest pain.  He is receive his breathing treatment at home but oxygen sat remained in the 80s.  He is on 4 L at home.  Patient diagnosed with postobstructive pneumonia with COPD exacerbation..  ED Course: Temperature 98.1 blood pressure 03/03/1959, pulse 135 respirate 28 and oxygen sat 90% room air.  VBG is 7.26.  Sodium 138 potassium 4.5 chloride 96 CO2 of 37.  Glucose 157 BUN 15 creatinine 0.90 calcium 8.1.  White count 13.1 the rest of CBC within normal.  Troponin 1210.  Lactic acid 1.1.  Chest x-ray showed persistent masslike consolidation in the left hilum concerning for malignancy which was seen on 919.  There is also increasing left lung consolidation consistent with postobstructive change and development of right basilar consolidation.  Patient will be admitted with acute on chronic respiratory failure with hypoxia secondary to postobstructive pneumonia.  Review of Systems: As per HPI otherwise 10 point review of systems negative.     Past Medical History:  Diagnosis Date   Asthma    BPH (benign prostatic hyperplasia)    CAD (coronary artery disease)    COPD (chronic obstructive pulmonary disease) (HCC)    GERD (gastroesophageal reflux disease)    HLD (hyperlipidemia)    Sleep apnea     Past Surgical History:  Procedure Laterality Date   ADENOIDECTOMY     CARDIAC CATHETERIZATION     GALLBLADDER SURGERY     LEFT HEART CATH AND CORONARY ANGIOGRAPHY Left 01/10/2019   Procedure: LEFT HEART CATH AND CORONARY ANGIOGRAPHY;  Surgeon: Teodoro Spray, MD;  Location: [place] [facility];  Service: Cardiovascular;  Laterality: Left;   TONSILLECTOMY       reports that he has quit smoking. He has quit using smokeless tobacco.  His smokeless tobacco use included chew. He reports current alcohol use. He reports that he does not use drugs.  Allergies  Allergen Reactions   Fluocinolone Other (See Comments)    Reaction: dizzines, "drunk" Other reaction(s): Unknown   Ciprofloxacin Hcl     Dizzy     Family History  Problem Relation Age of Onset   Heart disease Other    Kidney disease Neg Hx    Prostate cancer Neg Hx    Kidney cancer Neg Hx    Bladder Cancer Neg Hx      Prior to Admission medications   Medication Sig Start Date End Date Taking? Authorizing Provider  albuterol (PROVENTIL HFA;VENTOLIN HFA) 108 (90 Base) MCG/ACT inhaler Inhale 2 puffs into the lungs every 6 (  six) hours as needed for wheezing or shortness of breath.  12/12/13   [provider]  albuterol (PROVENTIL) (2.5 MG/3ML) 0.083% nebulizer solution Take 2.5 mg by nebulization every 6 (six) hours as needed for wheezing or shortness of breath.    [provider]  amoxicillin-clavulanate (AUGMENTIN) 875-125 MG tablet Take 1 tablet by mouth every 12 (twelve) hours for 12 days. 10/30/20 11/11/20  Sharen Hones, MD  aspirin EC 81 MG tablet Take 81 mg by mouth daily.    [provider]  atorvastatin (LIPITOR) 80 MG tablet Take 80  mg by mouth daily.    [provider]  carvedilol (COREG) 3.125 MG tablet Take 3.125 mg by mouth 2 (two) times daily. 05/05/20   [provider]  Cholecalciferol (VITAMIN D3 PO) Take 1 tablet by mouth daily.    [provider]  formoterol (PERFOROMIST) 20 MCG/2ML nebulizer solution Inhale 20 mcg into the lungs 2 (two) times daily.  11/16/17   [provider]  gabapentin (NEURONTIN) 100 MG capsule Take 100 mg by mouth daily.  12/21/17 12/28/19  [provider]  levothyroxine (SYNTHROID) 25 MCG tablet Take 25 mcg by mouth daily before breakfast. 12/15/18   [provider]  mirtazapine (REMERON) 15 MG tablet Take 15 mg by mouth at bedtime. 09/01/20   [provider]  Multiple Vitamins-Minerals (ZINC PO) Take 1 tablet by mouth daily.    [provider]  oxybutynin (DITROPAN-XL) 5 MG 24 hr tablet Take 5 mg by mouth daily. 08/27/20   [provider]  tamsulosin (FLOMAX) 0.4 MG CAPS capsule Take 0.8 mg by mouth daily. 08/27/20   [provider]  Tiotropium Bromide Monohydrate 2.5 MCG/ACT AERS Inhale 2 puffs into the lungs daily.    [provider]    Physical Exam: Vitals:   11/03/20 1800 11/03/20 1830 11/03/20 1900 11/03/20 1930  BP: 124/60 110/62 123/65 107/60  Pulse: 86 86 85 83  Resp: (!) 23 (!) 27 (!) 27 (!) 26  Temp:      TempSrc:      SpO2: 100% 93% 93% 92%  Weight:      Height:          Constitutional: Acutely ill looking in respiratory distress, lethargic Vitals:   11/03/20 1800 11/03/20 1830 11/03/20 1900 11/03/20 1930  BP: 124/60 110/62 123/65 107/60  Pulse: 86 86 85 83  Resp: (!) 23 (!) 27 (!) 27 (!) 26  Temp:      TempSrc:      SpO2: 100% 93% 93% 92%  Weight:      Height:       Eyes: PERRL, lids and conjunctivae normal ENMT: Mucous membranes are moist. Posterior pharynx clear of any exudate or lesions.Normal dentition.  Neck: normal, supple, no masses, no  thyromegaly Respiratory: Decreased air entry bilaterally with marked expiratory wheezing, Rales on accessory muscle use Cardiovascular: Sinus tachycardia, no murmurs / rubs / gallops. No extremity edema. 2+ pedal pulses. No carotid bruits.  Abdomen: no tenderness, no masses palpated. No hepatosplenomegaly. Bowel sounds positive.  Musculoskeletal: no clubbing / cyanosis. No joint deformity upper and lower extremities. Good ROM, no contractures. Normal muscle tone.  Skin: no rashes, lesions, ulcers. No induration Neurologic: CN 2-12 grossly intact. Sensation intact, DTR normal. Strength 5/5 in all 4.  Psychiatric: Lethargic, on BiPAP.     Labs on Admission: I have personally reviewed following labs and imaging studies  CBC: Recent Labs  Lab 10/28/20 0336 10/29/20 0508 11/03/20 1703  WBC 13.0* 11.2* 13.1*  NEUTROABS  --   --  10.5*  HGB 11.7* 11.9* 13.1  HCT 35.7* 36.5* 41.6  MCV 96.5 95.5 99.5  PLT 173 155 789   Basic Metabolic Panel: Recent Labs  Lab 10/28/20 0336 10/29/20 0508 10/30/20 0605 11/03/20 1703  NA 135 138 138 138  K 4.0 4.4 4.3 4.5  CL 99 101 99 96*  CO2 31 33* 31 37*  GLUCOSE 117* 119* 112* 157*  BUN 12 12 11 15   CREATININE 0.81 0.75 0.71 0.90  CALCIUM 7.9* 8.0* 8.2* 8.1*  MG 1.9  --  1.9  --   PHOS  --   --  3.3  --    GFR: Estimated Creatinine Clearance: 59.1 mL/min (by C-G formula based on SCr of 0.9 mg/dL). Liver Function Tests: Recent Labs  Lab 10/28/20 0336 11/03/20 1703  AST 24 32  ALT 13 29  ALKPHOS 69 89  BILITOT 0.9 1.0  PROT 5.8* 7.1  ALBUMIN 2.5* 3.0*   No results for input(s): LIPASE, AMYLASE in the last 168 hours. No results for input(s): AMMONIA in the last 168 hours. Coagulation Profile: No results for input(s): INR, PROTIME in the last 168 hours. Cardiac Enzymes: No results for input(s): CKTOTAL, CKMB, CKMBINDEX, TROPONINI in the last 168 hours. BNP (last 3 results) No results for input(s): PROBNP in the last 8760  hours. HbA1C: No results for input(s): HGBA1C in the last 72 hours. CBG: No results for input(s): GLUCAP in the last 168 hours. Lipid Profile: No results for input(s): CHOL, HDL, LDLCALC, TRIG, CHOLHDL, LDLDIRECT in the last 72 hours. Thyroid Function Tests: No results for input(s): TSH, T4TOTAL, FREET4, T3FREE, THYROIDAB in the last 72 hours. Anemia Panel: No results for input(s): VITAMINB12, FOLATE, FERRITIN, TIBC, IRON, RETICCTPCT in the last 72 hours. Urine analysis:    Component Value Date/Time   COLORURINE YELLOW 10/27/2020 2011   APPEARANCEUR CLEAR 10/27/2020 2011   APPEARANCEUR Clear 06/16/2017 1303   LABSPEC 1.015 10/27/2020 2011   LABSPEC 1.016 01/14/2012 2355   PHURINE 5.5 10/27/2020 2011   GLUCOSEU NEGATIVE 10/27/2020 2011   GLUCOSEU Negative 01/14/2012 2355   HGBUR NEGATIVE 10/27/2020 2011   BILIRUBINUR NEGATIVE 10/27/2020 2011   BILIRUBINUR Negative 06/16/2017 1303   BILIRUBINUR Negative 01/14/2012 [place] 10/27/2020 2011   PROTEINUR NEGATIVE 10/27/2020 2011   NITRITE NEGATIVE 10/27/2020 2011   LEUKOCYTESUR NEGATIVE 10/27/2020 2011   LEUKOCYTESUR Negative 01/14/2012 2355   Sepsis Labs: @LABRCNTIP (procalcitonin:4,lacticidven:4) ) Recent Results (from the past 240 hour(s))  Resp Panel by RT-PCR (Flu A&B, Covid) Nasopharyngeal Swab     Status: None   Collection Time: 10/27/20  8:11 PM   Specimen: Nasopharyngeal Swab; Nasopharyngeal(NP) swabs in vial transport medium  Result Value Ref Range Status   SARS Coronavirus 2 by RT PCR NEGATIVE NEGATIVE Final    Comment: (NOTE) SARS-CoV-2 target nucleic acids are NOT DETECTED.  The SARS-CoV-2 RNA is generally detectable in upper respiratory specimens during the acute phase of infection. The lowest concentration of SARS-CoV-2 viral copies this assay can detect is 138 copies/mL. A negative result does not preclude SARS-Cov-2 infection and should not be used as the sole basis for treatment or other  patient management decisions. A negative result may occur with  improper specimen collection/handling, submission of specimen other than nasopharyngeal swab, presence of viral mutation(s) within the areas targeted by this assay, and inadequate number of viral copies(<138 copies/mL). A negative result must be combined with clinical observations, patient history,  and epidemiological information. The expected result is Negative.  Fact Sheet for Patients:  EntrepreneurPulse.com.au  Fact Sheet for Healthcare Providers:  IncredibleEmployment.be  This test is no t yet approved or cleared by the [place] FDA and  has been authorized for detection and/or diagnosis of SARS-CoV-2 by FDA under an Emergency Use Authorization (EUA). This EUA will remain  in effect (meaning this test can be used) for the duration of the COVID-19 declaration under Section 564(b)(1) of the Act, 21 U.S.C.section 360bbb-3(b)(1), unless the authorization is terminated  or revoked sooner.       Influenza A by PCR NEGATIVE NEGATIVE Final   Influenza B by PCR NEGATIVE NEGATIVE Final    Comment: (NOTE) The Xpert Xpress SARS-CoV-2/FLU/RSV plus assay is intended as an aid in the diagnosis of influenza from Nasopharyngeal swab specimens and should not be used as a sole basis for treatment. Nasal washings and aspirates are unacceptable for Xpert Xpress SARS-CoV-2/FLU/RSV testing.  Fact Sheet for Patients: EntrepreneurPulse.com.au  Fact Sheet for Healthcare Providers: IncredibleEmployment.be  This test is not yet approved or cleared by the [place] FDA and has been authorized for detection and/or diagnosis of SARS-CoV-2 by FDA under an Emergency Use Authorization (EUA). This EUA will remain in effect (meaning this test can be used) for the duration of the COVID-19 declaration under Section 564(b)(1) of the Act, 21 U.S.C. section  360bbb-3(b)(1), unless the authorization is terminated or revoked.  Performed at [facility], [address] [place], [place] [address]   Urine Culture     Status: None   Collection Time: 10/27/20  8:11 PM   Specimen: Urine, Random  Result Value Ref Range Status   Specimen Description   Final    URINE, RANDOM Performed at [facility], [address] [place], [place] [address]    Special Requests   Final    NONE Performed at [facility], [address] [place], [place] [address]    Culture   Final    NO GROWTH Performed at [facility], [place] [address], [place], [place] [address]    Report Status 10/29/2020 FINAL  Final  Culture, blood (Routine X 2) w Reflex to ID Panel     Status: None   Collection Time: 10/28/20  6:55 AM   Specimen: BLOOD RIGHT HAND  Result Value Ref Range Status   Specimen Description BLOOD RIGHT HAND  Final   Special Requests   Final    BOTTLES DRAWN AEROBIC AND ANAEROBIC Blood Culture adequate volume   Culture   Final    NO GROWTH 5 DAYS Performed at [facility], [address] [place], [place] [address]    Report Status 11/02/2020 FINAL  Final  Culture, blood (Routine X 2) w Reflex to ID Panel     Status: None   Collection Time: 10/28/20  7:02 AM   Specimen: BLOOD  Result Value Ref Range Status   Specimen Description BLOOD RIGHT WRIST  Final   Special Requests   Final    BOTTLES DRAWN AEROBIC AND ANAEROBIC Blood Culture adequate volume   Culture   Final    NO GROWTH 5 DAYS Performed at [facility], [address] [place], [place] [address]    Report Status 11/02/2020 FINAL  Final  Expectorated Sputum Assessment w Gram Stain, Rflx to Resp Cult     Status: None   Collection Time: 11/03/20  5:03 PM   Specimen: Expectorated Sputum  Result Value Ref  Range Status   Specimen Description EXPECTORATED SPUTUM  Final   Special Requests NONE  Final   Sputum evaluation   Final    THIS  SPECIMEN IS ACCEPTABLE FOR SPUTUM CULTURE Performed at [facility], [address] [place], [place] [address]    Report Status 11/03/2020 FINAL  Final     Radiological Exams on Admission: DG Chest Portable 1 View  Result Date: 11/03/2020 CLINICAL DATA:  Short of breath, COPD EXAM: PORTABLE CHEST 1 VIEW COMPARISON:  10/27/2020 FINDINGS: Single frontal view of the chest demonstrates stable masslike consolidation at the left hilum. There is increasing consolidation within the left lung, with underlying volume loss. Background emphysema is again noted. Developing consolidation at the right lung base may be hypoventilatory. No large effusion or pneumothorax. IMPRESSION: 1. Persistent masslike consolidation at the left hilum, concerning for malignancy based on prior CT 10/27/2020. 2. Increasing left lung consolidation, which could reflect postobstructive change. 3. Developing right basilar consolidation, favor atelectasis. 4. Stable background emphysema. Electronically Signed   By: Randa Ngo M.D.   On: 11/03/2020 18:15    EKG: Independently reviewed.  Sinus tachycardia  Assessment/Plan Principal Problem:   Acute respiratory failure with hypoxia and hypercapnia (HCC) Active Problems:   COPD with acute exacerbation (HCC)   CAD (coronary artery disease)   HLD (hyperlipidemia)   Sleep apnea   GERD (gastroesophageal reflux disease)   Cavitating mass in left lower lung lobe     #1 acute respiratory failure with hypoxemia: Secondary to COPD exacerbation and postobstructive pneumonia.  Patient will be admitted to the hospital.  IV steroid, IV antibiotics for healthcare associated pneumonia, breathing treatments and maintained on BiPAP for now.  #2 acute exacerbation of COPD: Continue steroids, nebulizer, antibiotics  #3 coronary artery disease: Stable.  #4 hyperlipidemia: Continue statin  #5 cavitating mass in left lower lung lobe: Follow-up with [facility] post discharge for  biopsy.  Most likely percutaneous biopsy as patient may be unable to be intubated due to advanced COPD.  #6 GERD: Continue with PPIs  #7 obstructive sleep apnea: Continue with the BiPAP.   DVT prophylaxis: Lovenox Code Status: DNR Family Communication: Wife and daughter Disposition Plan: To be determined Consults called: None but will follow-up with her pulmonologist Admission status: Inpatient  Severity of Illness: The appropriate patient status for this patient is INPATIENT. Inpatient status is judged to be reasonable and necessary in order to provide the required intensity of service to ensure the patient's safety. The patient's presenting symptoms, physical exam findings, and initial radiographic and laboratory data in the context of their chronic comorbidities is felt to place them at high risk for further clinical deterioration. Furthermore, it is not anticipated that the patient will be medically stable for discharge from the hospital within 2 midnights of admission. The following factors support the patient status of inpatient.   " The patient's presenting symptoms include shortness of breath. " The worrisome physical exam findings include acute respiratory distress. " The initial radiographic and laboratory data are worrisome because of chest x-ray showing pneumonia and lung mass. " The chronic co-morbidities include cavitary lung mass and COPD.   * I certify that at the point of admission it is my clinical judgment that the patient will require inpatient hospital care spanning beyond 2 midnights from the point of admission due to high intensity of service, high risk for further deterioration and high frequency of surveillance required.Barbette Merino MD [facility] Pager [phone]  If 7PM-7AM, please contact night-coverage [email or web address] Password [facility]  11/03/2020, 7:40 PM

## 2020-11-03 NOTE — Consult Note (Signed)
PHARMACY -  BRIEF ANTIBIOTIC NOTE   Pharmacy has received consult(s) for Zosyn from an ED provider.  The patient's profile has been reviewed for ht/wt/allergies/indication/available labs.    One time order(s) placed for Zosyn 3.375g x 1  Further antibiotics/pharmacy consults should be ordered by admitting physician if indicated.                       Thank you,  Lu Duffel, PharmD, BCPS Clinical Pharmacist 11/03/2020 6:53 PM;

## 2020-11-03 NOTE — Consult Note (Signed)
Pharmacy Antibiotic Note  Chris Lozano is a 80 y.o. male admitted on 11/03/2020 with pneumonia/HCAP.  Pharmacy has been consulted for Vancomcyin/Cefepime dosing.  Plan: Will start Cefepime 2g q12h  Will start Vancomycin 1500 mg IV Q 24 hrs.  Goal AUC 400-550. Expected AUC: 537 SCr used: 0.9  Will check MRSA PCR for possible de-escalation of Vancomycin  Height: 5\' 6"  (167.6 cm) Weight: 72.6 kg (160 lb) IBW/kg (Calculated) : 63.8  Temp (24hrs), Avg:98.1 F (36.7 C), Min:98.1 F (36.7 C), Max:98.1 F (36.7 C)  Recent Labs  Lab 10/28/20 0336 10/28/20 0655 10/29/20 0508 10/30/20 0605 11/03/20 1703  WBC 13.0*  --  11.2*  --  13.1*  CREATININE 0.81  --  0.75 0.71 0.90  LATICACIDVEN  --  0.9  --   --  1.0    Estimated Creatinine Clearance: 59.1 mL/min (by C-G formula based on SCr of 0.9 mg/dL).    Allergies  Allergen Reactions   Fluocinolone Other (See Comments)    Reaction: dizzines, "drunk" Other reaction(s): Unknown   Ciprofloxacin Hcl     Dizzy     Antimicrobials this admission: Zosyn 3.375g x 1 Vancomcyin 9/26 >> Cefepime 9/26 >>  Dose adjustments this admission: None  Microbiology results: 9/26 BCx: pending  9/26 Sputum: pending  9/26 MRSA PCR: ordered  Thank you for allowing pharmacy to be a part of this patient's care.  Lu Duffel, PharmD, BCPS Clinical Pharmacist 11/03/2020 7:58 PM

## 2020-11-03 NOTE — ED Triage Notes (Signed)
Per EMS, pt was having difficulty breathing. Pt SpO2 was 86% in 2L [place] @ home, EMS put pt on 6L and SATs came up to 96%. Pt has a hx of COPD, emphysema. Pt has a cough associated with yellow sputum. Pt was hospitalized last week and was discharged on Thus. 09/22. Pt is currently taking antibiotics for pneumonia. Pt denies pain.

## 2020-11-03 NOTE — ED Notes (Signed)
RN was unable to obtain access to obtain 2nd set of blood cultures. Lab was called.

## 2020-11-04 DIAGNOSIS — J9601 Acute respiratory failure with hypoxia: Secondary | ICD-10-CM | POA: Diagnosis not present

## 2020-11-04 DIAGNOSIS — J441 Chronic obstructive pulmonary disease with (acute) exacerbation: Secondary | ICD-10-CM

## 2020-11-04 DIAGNOSIS — J9602 Acute respiratory failure with hypercapnia: Secondary | ICD-10-CM | POA: Diagnosis not present

## 2020-11-04 DIAGNOSIS — J9690 Respiratory failure, unspecified, unspecified whether with hypoxia or hypercapnia: Secondary | ICD-10-CM | POA: Diagnosis present

## 2020-11-04 DIAGNOSIS — J189 Pneumonia, unspecified organism: Secondary | ICD-10-CM

## 2020-11-04 LAB — COMPREHENSIVE METABOLIC PANEL
ALT: 24 U/L (ref 0–44)
AST: 28 U/L (ref 15–41)
Albumin: 2.6 g/dL — ABNORMAL LOW (ref 3.5–5.0)
Alkaline Phosphatase: 73 U/L (ref 38–126)
Anion gap: 8 (ref 5–15)
BUN: 18 mg/dL (ref 8–23)
CO2: 33 mmol/L — ABNORMAL HIGH (ref 22–32)
Calcium: 8.2 mg/dL — ABNORMAL LOW (ref 8.9–10.3)
Chloride: 97 mmol/L — ABNORMAL LOW (ref 98–111)
Creatinine, Ser: 0.79 mg/dL (ref 0.61–1.24)
GFR, Estimated: >60 mL/min (ref >60)
Glucose, Bld: 157 mg/dL — ABNORMAL HIGH (ref 70–99)
Potassium: 4.6 mmol/L (ref 3.5–5.1)
Sodium: 138 mmol/L (ref 135–145)
Total Bilirubin: 0.9 mg/dL (ref 0.3–1.2)
Total Protein: 6.5 g/dL (ref 6.5–8.1)

## 2020-11-04 LAB — CBC
HCT: 40.1 % (ref 39.0–52.0)
Hemoglobin: 12.4 g/dL — ABNORMAL LOW (ref 13.0–17.0)
MCH: 29.7 pg (ref 26.0–34.0)
MCHC: 30.9 g/dL (ref 30.0–36.0)
MCV: 96.2 fL (ref 80.0–100.0)
Platelets: 176 10*3/uL (ref 150–400)
RBC: 4.17 MIL/uL — ABNORMAL LOW (ref 4.22–5.81)
RDW: 13.7 % (ref 11.5–15.5)
WBC: 9.8 10*3/uL (ref 4.0–10.5)
nRBC: 0 % (ref 0.0–0.2)

## 2020-11-04 LAB — PROCALCITONIN: Procalcitonin: <0.1 ng/mL

## 2020-11-04 LAB — PROTIME-INR
INR: 1.2 (ref 0.8–1.2)
Prothrombin Time: 15.2 seconds (ref 11.4–15.2)

## 2020-11-04 LAB — CORTISOL-AM, BLOOD: Cortisol - AM: 5.9 ug/dL — ABNORMAL LOW (ref 6.7–22.6)

## 2020-11-04 MED ORDER — ATORVASTATIN CALCIUM 80 MG PO TABS
80.0000 mg | ORAL_TABLET | Freq: Every day | ORAL | Status: DC
  Administered 2020-11-04 – 2020-11-07 (×4): 80 mg via ORAL
  Filled 2020-11-04: qty 1
  Filled 2020-11-04: qty 4
  Filled 2020-11-04 (×2): qty 1

## 2020-11-04 MED ORDER — CARVEDILOL 3.125 MG PO TABS
3.1250 mg | ORAL_TABLET | Freq: Two times a day (BID) | ORAL | Status: DC
  Administered 2020-11-04 – 2020-11-07 (×4): 3.125 mg via ORAL
  Filled 2020-11-04 (×5): qty 1

## 2020-11-04 MED ORDER — PANTOPRAZOLE SODIUM 40 MG PO TBEC
40.0000 mg | DELAYED_RELEASE_TABLET | Freq: Every day | ORAL | Status: DC
  Administered 2020-11-05 – 2020-11-07 (×3): 40 mg via ORAL
  Filled 2020-11-04 (×3): qty 1

## 2020-11-04 MED ORDER — LEVOTHYROXINE SODIUM 25 MCG PO TABS
25.0000 ug | ORAL_TABLET | Freq: Every day | ORAL | Status: DC
  Administered 2020-11-04 – 2020-11-07 (×4): 25 ug via ORAL
  Filled 2020-11-04 (×4): qty 1

## 2020-11-04 MED ORDER — DM-GUAIFENESIN ER 30-600 MG PO TB12
1.0000 | ORAL_TABLET | Freq: Two times a day (BID) | ORAL | Status: DC
  Administered 2020-11-04 – 2020-11-07 (×6): 1 via ORAL
  Filled 2020-11-04 (×6): qty 1

## 2020-11-04 MED ORDER — ASPIRIN 81 MG PO CHEW
81.0000 mg | CHEWABLE_TABLET | Freq: Every day | ORAL | Status: DC
  Administered 2020-11-04 – 2020-11-06 (×3): 81 mg via ORAL
  Filled 2020-11-04 (×3): qty 1

## 2020-11-04 MED ORDER — IPRATROPIUM-ALBUTEROL 0.5-2.5 (3) MG/3ML IN SOLN
3.0000 mL | Freq: Four times a day (QID) | RESPIRATORY_TRACT | Status: DC
  Administered 2020-11-04 – 2020-11-07 (×11): 3 mL via RESPIRATORY_TRACT
  Filled 2020-11-04 (×10): qty 3

## 2020-11-04 MED ORDER — METHYLPREDNISOLONE SODIUM SUCC 125 MG IJ SOLR
80.0000 mg | Freq: Two times a day (BID) | INTRAMUSCULAR | Status: DC
  Administered 2020-11-04 – 2020-11-05 (×4): 80 mg via INTRAVENOUS
  Filled 2020-11-04 (×4): qty 2

## 2020-11-04 MED ORDER — INFLUENZA VAC A&B SA ADJ QUAD 0.5 ML IM PRSY
0.5000 mL | PREFILLED_SYRINGE | INTRAMUSCULAR | Status: AC
  Administered 2020-11-07: 0.5 mL via INTRAMUSCULAR
  Filled 2020-11-04: qty 0.5

## 2020-11-04 MED ORDER — GABAPENTIN 100 MG PO CAPS
100.0000 mg | ORAL_CAPSULE | Freq: Every day | ORAL | Status: DC
  Administered 2020-11-04 – 2020-11-07 (×4): 100 mg via ORAL
  Filled 2020-11-04 (×4): qty 1

## 2020-11-04 NOTE — ED Notes (Signed)
Pt placed on hospital bed for comfort.

## 2020-11-04 NOTE — ED Notes (Signed)
Informed RN bed assigned 

## 2020-11-04 NOTE — ED Notes (Signed)
Pt awake, watching TV - given ice water. Denies needs at this time.

## 2020-11-04 NOTE — ED Notes (Signed)
Pt resting comfortably at this time. Pt requests "some food'. Pt given some crackers due to unknown time of breakfast arrival. Pt is currently on 6L/min via [place]. Family at bedside. NAD noted. Call bell in reach.

## 2020-11-04 NOTE — ED Notes (Signed)
Pt placed back on bi-pap while he sleeps due to pt being a mouth breather, RT aware.

## 2020-11-04 NOTE — Progress Notes (Addendum)
PROGRESS NOTE   HPI was taken from Dr. Jonelle Sidle: Chris Lozano is a 80 y.o. male with medical history significant of COPD, coronary artery disease, GERD, hyperlipidemia, obstructive sleep apnea and asthma with recent diagnosis of lung mass who is awaiting work-up at [facility] for possible lung cancer.  Patient was recently diagnosed with pneumonia and was in the hospital on treated.  He was discharged home but for family he was not on steroids on the antibiotics.  He started having worsening shortness of breath after arriving home.  Was brought back by family secondary to worsening shortness of breath respiratory failure and currently on BiPAP.  History obtained from wife and daughter at bedside.  He is currently sleepy.  He is too weak.  No chest pain.  He is receive his breathing treatment at home but oxygen sat remained in the 80s.  He is on 4 L at home.  Patient diagnosed with postobstructive pneumonia with COPD exacerbation..   ED Course: Temperature 98.1 blood pressure 03/03/1959, pulse 135 respirate 28 and oxygen sat 90% room air.  VBG is 7.26.  Sodium 138 potassium 4.5 chloride 96 CO2 of 37.  Glucose 157 BUN 15 creatinine 0.90 calcium 8.1.  White count 13.1 the rest of CBC within normal.  Troponin 1210.  Lactic acid 1.1.  Chest x-ray showed persistent masslike consolidation in the left hilum concerning for malignancy which was seen on 919.  There is also increasing left lung consolidation consistent with postobstructive change and development of right basilar consolidation.  Patient will be admitted with acute on chronic respiratory failure with hypoxia secondary to postobstructive pneumonia.   Chris Lozano  WYO:[id number] DOB: [birth year] DOA: 11/03/2020 PCP: Adin Hector, MD   Assessment & Plan:   Principal Problem:   Acute respiratory failure with hypoxia and hypercapnia ([place]) Active Problems:   COPD with acute exacerbation (HCC)   CAD (coronary artery disease)   HLD  (hyperlipidemia)   Sleep apnea   GERD (gastroesophageal reflux disease)   Cavitating mass in left lower lung lobe   Acute hypoxic respiratory failure: secondary to COPD exacerbation and postobstructive pneumonia. Continue on IV cefepime, vanco, steroids, bronchodilators and incentive spirometry   Likely postobstructive pneumonia: continue on IV abxs, steroids, bronchodilators & supplemental oxygen. Has a lung mass that has not been biopsied yet and was following up at [facility]    COPD exacerbation: continue on steroids, bronchodilators & encourage incentive spirometry    Hx of CAD: continue on home dose of coreg, aspirin, statin    HLD: continue on statin    Lung mass: etiology unclear. Possibly malignancy. Was following up at [facility].    GERD: continue on PPI    OSA: will order CPAP qhs    DVT prophylaxis: lovenox  Code Status: DNR Family Communication: discussed pt's care w/ pt's family at bedside and answered their questions  Disposition Plan: depends on PT/OT recs (not consulted yet)  Level of care: PCU  Status is: Inpatient  Remains inpatient appropriate because:Unsafe d/c plan, IV treatments appropriate due to intensity of illness or inability to take PO, and Inpatient level of care appropriate due to severity of illness  Dispo: The patient is from: Home              Anticipated d/c is to: SNF vs [facility]               Patient currently is not medically stable to d/c.   Difficult to place patient :  unclear    Consultants:    Procedures:  Antimicrobials: (cefepime, vanco    Subjective: Pt c/o shortness of breath   Objective: Vitals:   11/04/20 0530 11/04/20 0600 11/04/20 0630 11/04/20 0700  BP: 95/63 99/64 101/67 97/69  Pulse: (!) 59 (!) 57 (!) 57 60  Resp: 19 (!) 23 (!) 28 (!) 21  Temp:      TempSrc:      SpO2: 92% 93% 93% 96%  Weight:      Height:        Intake/Output Summary (Last 24 hours) at 11/04/2020 0755 Last data filed at 11/03/2020 2351 Gross per 24  hour  Intake 500 ml  Output --  Net 500 ml   Filed Weights   11/03/20 1730  Weight: 72.6 kg    Examination:  General exam: Appears calm and comfortable  Respiratory system: diminished breath sounds b/l  Cardiovascular system: S1 & S2 +. No rubs, gallops or clicks. Gastrointestinal system: Abdomen is nondistended, soft and nontender. Normal bowel sounds heard. Central nervous system: Alert and oriented. Moves all extremities  Psychiatry: Judgement and insight appear normal. Mood & affect appropriate.     Data Reviewed: I have personally reviewed following labs and imaging studies  CBC: Recent Labs  Lab 10/29/20 0508 11/03/20 1703 11/04/20 0619  WBC 11.2* 13.1* 9.8  NEUTROABS  --  10.5*  --   HGB 11.9* 13.1 12.4*  HCT 36.5* 41.6 40.1  MCV 95.5 99.5 96.2  PLT 155 206 381   Basic Metabolic Panel: Recent Labs  Lab 10/29/20 0508 10/30/20 0605 11/03/20 1703 11/04/20 0619  NA 138 138 138 138  K 4.4 4.3 4.5 4.6  CL 101 99 96* 97*  CO2 33* 31 37* 33*  GLUCOSE 119* 112* 157* 157*  BUN 12 11 15 18   CREATININE 0.75 0.71 0.90 0.79  CALCIUM 8.0* 8.2* 8.1* 8.2*  MG  --  1.9  --   --   PHOS  --  3.3  --   --    GFR: Estimated Creatinine Clearance: 66.5 mL/min (by C-G formula based on SCr of 0.79 mg/dL). Liver Function Tests: Recent Labs  Lab 11/03/20 1703 11/04/20 0619  AST 32 28  ALT 29 24  ALKPHOS 89 73  BILITOT 1.0 0.9  PROT 7.1 6.5  ALBUMIN 3.0* 2.6*   No results for input(s): LIPASE, AMYLASE in the last 168 hours. No results for input(s): AMMONIA in the last 168 hours. Coagulation Profile: Recent Labs  Lab 11/04/20 0619  INR 1.2   Cardiac Enzymes: No results for input(s): CKTOTAL, CKMB, CKMBINDEX, TROPONINI in the last 168 hours. BNP (last 3 results) No results for input(s): PROBNP in the last 8760 hours. HbA1C: No results for input(s): HGBA1C in the last 72 hours. CBG: No results for input(s): GLUCAP in the last 168 hours. Lipid Profile: No  results for input(s): CHOL, HDL, LDLCALC, TRIG, CHOLHDL, LDLDIRECT in the last 72 hours. Thyroid Function Tests: No results for input(s): TSH, T4TOTAL, FREET4, T3FREE, THYROIDAB in the last 72 hours. Anemia Panel: No results for input(s): VITAMINB12, FOLATE, FERRITIN, TIBC, IRON, RETICCTPCT in the last 72 hours. Sepsis Labs: Recent Labs  Lab 11/03/20 1703 11/03/20 1945 11/04/20 0619  PROCALCITON  --   --  <0.10  LATICACIDVEN 1.0 1.1  --     Recent Results (from the past 240 hour(s))  Resp Panel by RT-PCR (Flu A&B, Covid) Nasopharyngeal Swab     Status: None   Collection Time: 10/27/20  8:11 PM  Specimen: Nasopharyngeal Swab; Nasopharyngeal(NP) swabs in vial transport medium  Result Value Ref Range Status   SARS Coronavirus 2 by RT PCR NEGATIVE NEGATIVE Final    Comment: (NOTE) SARS-CoV-2 target nucleic acids are NOT DETECTED.  The SARS-CoV-2 RNA is generally detectable in upper respiratory specimens during the acute phase of infection. The lowest concentration of SARS-CoV-2 viral copies this assay can detect is 138 copies/mL. A negative result does not preclude SARS-Cov-2 infection and should not be used as the sole basis for treatment or other patient management decisions. A negative result may occur with  improper specimen collection/handling, submission of specimen other than nasopharyngeal swab, presence of viral mutation(s) within the areas targeted by this assay, and inadequate number of viral copies(<138 copies/mL). A negative result must be combined with clinical observations, patient history, and epidemiological information. The expected result is Negative.  Fact Sheet for Patients:  EntrepreneurPulse.com.au  Fact Sheet for Healthcare Providers:  IncredibleEmployment.be  This test is no t yet approved or cleared by the [place] FDA and  has been authorized for detection and/or diagnosis of SARS-CoV-2 by FDA under an  Emergency Use Authorization (EUA). This EUA will remain  in effect (meaning this test can be used) for the duration of the COVID-19 declaration under Section 564(b)(1) of the Act, 21 U.S.C.section 360bbb-3(b)(1), unless the authorization is terminated  or revoked sooner.       Influenza A by PCR NEGATIVE NEGATIVE Final   Influenza B by PCR NEGATIVE NEGATIVE Final    Comment: (NOTE) The Xpert Xpress SARS-CoV-2/FLU/RSV plus assay is intended as an aid in the diagnosis of influenza from Nasopharyngeal swab specimens and should not be used as a sole basis for treatment. Nasal washings and aspirates are unacceptable for Xpert Xpress SARS-CoV-2/FLU/RSV testing.  Fact Sheet for Patients: EntrepreneurPulse.com.au  Fact Sheet for Healthcare Providers: IncredibleEmployment.be  This test is not yet approved or cleared by the [place] FDA and has been authorized for detection and/or diagnosis of SARS-CoV-2 by FDA under an Emergency Use Authorization (EUA). This EUA will remain in effect (meaning this test can be used) for the duration of the COVID-19 declaration under Section 564(b)(1) of the Act, 21 U.S.C. section 360bbb-3(b)(1), unless the authorization is terminated or revoked.  Performed at [facility], [address], [place], [place] [address]   Urine Culture     Status: None   Collection Time: 10/27/20  8:11 PM   Specimen: Urine, Random  Result Value Ref Range Status   Specimen Description   Final    URINE, RANDOM Performed at [facility], [address] [place], [place] [address]    Special Requests   Final    NONE Performed at [facility], [address], [place], [place] [address]    Culture   Final    NO GROWTH Performed at [facility], [place] [address], [place], [place] [address]    Report Status 10/29/2020 FINAL  Final  Culture, blood (Routine X 2) w Reflex to ID Panel     Status: None    Collection Time: 10/28/20  6:55 AM   Specimen: BLOOD RIGHT HAND  Result Value Ref Range Status   Specimen Description BLOOD RIGHT HAND  Final   Special Requests   Final    BOTTLES DRAWN AEROBIC AND ANAEROBIC Blood Culture adequate volume   Culture   Final    NO GROWTH 5 DAYS Performed at [facility], [address] [place], [place] [address]  Report Status 11/02/2020 FINAL  Final  Culture, blood (Routine X 2) w Reflex to ID Panel     Status: None   Collection Time: 10/28/20  7:02 AM   Specimen: BLOOD  Result Value Ref Range Status   Specimen Description BLOOD RIGHT WRIST  Final   Special Requests   Final    BOTTLES DRAWN AEROBIC AND ANAEROBIC Blood Culture adequate volume   Culture   Final    NO GROWTH 5 DAYS Performed at [facility], [address] [place], [place] [address]    Report Status 11/02/2020 FINAL  Final  Blood culture (routine x 2)     Status: None (Preliminary result)   Collection Time: 11/03/20  5:03 PM   Specimen: BLOOD  Result Value Ref Range Status   Specimen Description BLOOD LEFT ANTECUBITAL  Final   Special Requests   Final    BOTTLES DRAWN AEROBIC AND ANAEROBIC Blood Culture adequate volume   Culture   Final    NO GROWTH < 24 HOURS Performed at [facility], [address] [place], [place] [address]    Report Status PENDING  Incomplete  Expectorated Sputum Assessment w Gram Stain, Rflx to Resp Cult     Status: None   Collection Time: 11/03/20  5:03 PM   Specimen: Expectorated Sputum  Result Value Ref Range Status   Specimen Description EXPECTORATED SPUTUM  Final   Special Requests NONE  Final   Sputum evaluation   Final    THIS SPECIMEN IS ACCEPTABLE FOR SPUTUM CULTURE Performed at [facility], [address] [place], [place] [address]    Report Status 11/03/2020 FINAL  Final  Culture, Respiratory w Gram Stain     Status: None (Preliminary result)   Collection Time: 11/03/20  5:03 PM  Result  Value Ref Range Status   Specimen Description   Final    EXPECTORATED SPUTUM Performed at [facility], [address] [place], [place] [address]    Special Requests   Final    NONE Reflexed from [id number] Performed at [facility], [address] [place], [place] [address]    Gram Stain   Final    FEW SQUAMOUS EPITHELIAL CELLS PRESENT FEW WBC SEEN FEW YEAST WITH PSEUDOHYPHAE Performed at [facility], [place] [address], [place], [place] [address]    Culture PENDING  Incomplete   Report Status PENDING  Incomplete  Blood culture (routine x 2)     Status: None (Preliminary result)   Collection Time: 11/03/20  5:48 PM   Specimen: BLOOD  Result Value Ref Range Status   Specimen Description BLOOD BLOOD RIGHT HAND  Final   Special Requests   Final    BOTTLES DRAWN AEROBIC AND ANAEROBIC Blood Culture adequate volume   Culture   Final    NO GROWTH < 24 HOURS Performed at [facility], [address] [place], [place] [address]    Report Status PENDING  Incomplete  Resp Panel by RT-PCR (Flu A&B, Covid) Nasopharyngeal Swab     Status: None   Collection Time: 11/03/20  7:45 PM   Specimen: Nasopharyngeal Swab; Nasopharyngeal(NP) swabs in vial transport medium  Result Value Ref Range Status   SARS Coronavirus 2 by RT PCR NEGATIVE NEGATIVE Final    Comment: (NOTE) SARS-CoV-2 target nucleic acids are NOT DETECTED.  The SARS-CoV-2 RNA is generally detectable in upper respiratory specimens during the acute phase of infection. The lowest concentration of SARS-CoV-2 viral copies this  assay can detect is 138 copies/mL. A negative result does not preclude SARS-Cov-2 infection and should not be used as the sole basis for treatment or other patient management decisions. A negative result may occur with  improper specimen collection/handling, submission of specimen other than nasopharyngeal swab, presence of viral mutation(s) within the areas targeted by this  assay, and inadequate number of viral copies(<138 copies/mL). A negative result must be combined with clinical observations, patient history, and epidemiological information. The expected result is Negative.  Fact Sheet for Patients:  EntrepreneurPulse.com.au  Fact Sheet for Healthcare Providers:  IncredibleEmployment.be  This test is no t yet approved or cleared by the [place] FDA and  has been authorized for detection and/or diagnosis of SARS-CoV-2 by FDA under an Emergency Use Authorization (EUA). This EUA will remain  in effect (meaning this test can be used) for the duration of the COVID-19 declaration under Section 564(b)(1) of the Act, 21 U.S.C.section 360bbb-3(b)(1), unless the authorization is terminated  or revoked sooner.       Influenza A by PCR NEGATIVE NEGATIVE Final   Influenza B by PCR NEGATIVE NEGATIVE Final    Comment: (NOTE) The Xpert Xpress SARS-CoV-2/FLU/RSV plus assay is intended as an aid in the diagnosis of influenza from Nasopharyngeal swab specimens and should not be used as a sole basis for treatment. Nasal washings and aspirates are unacceptable for Xpert Xpress SARS-CoV-2/FLU/RSV testing.  Fact Sheet for Patients: EntrepreneurPulse.com.au  Fact Sheet for Healthcare Providers: IncredibleEmployment.be  This test is not yet approved or cleared by the [place] FDA and has been authorized for detection and/or diagnosis of SARS-CoV-2 by FDA under an Emergency Use Authorization (EUA). This EUA will remain in effect (meaning this test can be used) for the duration of the COVID-19 declaration under Section 564(b)(1) of the Act, 21 U.S.C. section 360bbb-3(b)(1), unless the authorization is terminated or revoked.  Performed at [facility], [address], [place], [place] [address]          Radiology Studies: DG Chest Portable 1 View  Result Date:  11/03/2020 CLINICAL DATA:  Short of breath, COPD EXAM: PORTABLE CHEST 1 VIEW COMPARISON:  10/27/2020 FINDINGS: Single frontal view of the chest demonstrates stable masslike consolidation at the left hilum. There is increasing consolidation within the left lung, with underlying volume loss. Background emphysema is again noted. Developing consolidation at the right lung base may be hypoventilatory. No large effusion or pneumothorax. IMPRESSION: 1. Persistent masslike consolidation at the left hilum, concerning for malignancy based on prior CT 10/27/2020. 2. Increasing left lung consolidation, which could reflect postobstructive change. 3. Developing right basilar consolidation, favor atelectasis. 4. Stable background emphysema. Electronically Signed   By: Randa Ngo M.D.   On: 11/03/2020 18:15        Scheduled Meds:  enoxaparin (LOVENOX) injection  40 mg Subcutaneous Q24H   methylPREDNISolone (SOLU-MEDROL) injection  125 mg Intravenous Q12H   Continuous Infusions:  ceFEPime (MAXIPIME) IV Stopped (11/03/20 2351)   lactated ringers 75 mL/hr at 11/03/20 1959   vancomycin Stopped (11/03/20 2255)     LOS: 1 day    Time spent: 32 mins     Wyvonnia Dusky, MD [facility] Pager [phone]  If 7PM-7AM, please contact night-coverage 11/04/2020, 7:55 AM

## 2020-11-04 NOTE — ED Notes (Signed)
Assumed care of patient: First contact.   Pt updated to plan of care, family at bedside also updated. Pt denies further needs at this time.

## 2020-11-04 NOTE — ED Notes (Signed)
Pt given fresh ice water and a new [employer] placed. Pt denies needs. Family at bedside. Call bell in reach.

## 2020-11-04 NOTE — ED Notes (Signed)
This RN called to room by patients wife - Pt noted to be grabbing at mask - foreign object in mouth. Pt noted to have both dentures in, dentures removed, pt removed from bipap, oral care provided, RT notified.    Pt placed on [place] 4L/ no resp distress noted. Given ice water.

## 2020-11-05 ENCOUNTER — Inpatient Hospital Stay: Payer: Medicare Other

## 2020-11-05 DIAGNOSIS — G4733 Obstructive sleep apnea (adult) (pediatric): Secondary | ICD-10-CM

## 2020-11-05 DIAGNOSIS — E785 Hyperlipidemia, unspecified: Secondary | ICD-10-CM

## 2020-11-05 DIAGNOSIS — R918 Other nonspecific abnormal finding of lung field: Secondary | ICD-10-CM

## 2020-11-05 DIAGNOSIS — I10 Essential (primary) hypertension: Secondary | ICD-10-CM

## 2020-11-05 DIAGNOSIS — R2681 Unsteadiness on feet: Secondary | ICD-10-CM

## 2020-11-05 LAB — CBC
HCT: 34.6 % — ABNORMAL LOW (ref 39.0–52.0)
Hemoglobin: 11.4 g/dL — ABNORMAL LOW (ref 13.0–17.0)
MCH: 31.6 pg (ref 26.0–34.0)
MCHC: 32.9 g/dL (ref 30.0–36.0)
MCV: 95.8 fL (ref 80.0–100.0)
Platelets: 174 10*3/uL (ref 150–400)
RBC: 3.61 MIL/uL — ABNORMAL LOW (ref 4.22–5.81)
RDW: 13.6 % (ref 11.5–15.5)
WBC: 16.5 10*3/uL — ABNORMAL HIGH (ref 4.0–10.5)
nRBC: 0 % (ref 0.0–0.2)

## 2020-11-05 LAB — BASIC METABOLIC PANEL
Anion gap: 3 — ABNORMAL LOW (ref 5–15)
BUN: 21 mg/dL (ref 8–23)
CO2: 34 mmol/L — ABNORMAL HIGH (ref 22–32)
Calcium: 8 mg/dL — ABNORMAL LOW (ref 8.9–10.3)
Chloride: 101 mmol/L (ref 98–111)
Creatinine, Ser: 0.86 mg/dL (ref 0.61–1.24)
GFR, Estimated: >60 mL/min (ref >60)
Glucose, Bld: 114 mg/dL — ABNORMAL HIGH (ref 70–99)
Potassium: 4.4 mmol/L (ref 3.5–5.1)
Sodium: 138 mmol/L (ref 135–145)

## 2020-11-05 LAB — MAGNESIUM: Magnesium: 1.9 mg/dL (ref 1.7–2.4)

## 2020-11-05 LAB — MRSA NEXT GEN BY PCR, NASAL: MRSA by PCR Next Gen: NOT DETECTED

## 2020-11-05 MED ORDER — AZITHROMYCIN 250 MG PO TABS
250.0000 mg | ORAL_TABLET | Freq: Every day | ORAL | Status: AC
  Administered 2020-11-06 – 2020-11-07 (×2): 250 mg via ORAL
  Filled 2020-11-05 (×3): qty 1

## 2020-11-05 MED ORDER — VANCOMYCIN HCL 1500 MG/300ML IV SOLN
1500.0000 mg | INTRAVENOUS | Status: DC
  Filled 2020-11-05: qty 300

## 2020-11-05 MED ORDER — AZITHROMYCIN 250 MG PO TABS
500.0000 mg | ORAL_TABLET | Freq: Every day | ORAL | Status: AC
  Administered 2020-11-05: 500 mg via ORAL
  Filled 2020-11-05: qty 2

## 2020-11-05 MED ORDER — SODIUM CHLORIDE 0.9 % IV SOLN
2.0000 g | Freq: Three times a day (TID) | INTRAVENOUS | Status: DC
  Administered 2020-11-05 – 2020-11-07 (×7): 2 g via INTRAVENOUS
  Filled 2020-11-05 (×9): qty 2

## 2020-11-05 MED ORDER — GADOBUTROL 1 MMOL/ML IV SOLN
7.0000 mL | Freq: Once | INTRAVENOUS | Status: AC | PRN
  Administered 2020-11-05: 7 mL via INTRAVENOUS

## 2020-11-05 NOTE — Plan of Care (Signed)

## 2020-11-05 NOTE — Progress Notes (Signed)
Patient ID: Chris Lozano, male   DOB: Jun 11, 1940, 80 y.o.   MRN: [id number] [facility] PROGRESS NOTE  DARLY FAILS [id number]:[id number] DOB: [birth year] DOA: 11/03/2020 PCP: Adin Hector, MD  HPI/Subjective: Patient coming in with shortness of breath and cough.  Diagnosed with postobstructive pneumonia.  He has been unsteady with his walking going on for a while.  Patient with some shortness of breath and cough.  On 5 L of oxygen this morning when I saw him he chronically wears 2.  Objective: Vitals:   11/05/20 1314 11/05/20 1528  BP:  98/62  Pulse:  68  Resp:  18  Temp:  97.6 F (36.4 C)  SpO2: 92% 93%    Intake/Output Summary (Last 24 hours) at 11/05/2020 1726 Last data filed at 11/05/2020 0047 Gross per 24 hour  Intake --  Output 500 ml  Net -500 ml   Filed Weights   11/03/20 1730 11/05/20 0403  Weight: 72.6 kg 76.9 kg    ROS: Review of Systems  Constitutional:  Positive for malaise/fatigue.  Respiratory:  Positive for cough and shortness of breath.   Cardiovascular:  Negative for chest pain.  Gastrointestinal:  Negative for abdominal pain, nausea and vomiting.  Exam: Physical Exam HENT:     Head: Normocephalic.     Mouth/Throat:     Pharynx: No oropharyngeal exudate.  Eyes:     General: Lids are normal.     Conjunctiva/sclera: Conjunctivae normal.  Cardiovascular:     Rate and Rhythm: Normal rate and regular rhythm.     Heart sounds: Normal heart sounds, S1 normal and S2 normal.  Pulmonary:     Breath sounds: Examination of the right-lower field reveals decreased breath sounds. Examination of the left-lower field reveals decreased breath sounds. Decreased breath sounds present. No wheezing, rhonchi or rales.  Abdominal:     Palpations: Abdomen is soft.     Tenderness: There is no abdominal tenderness.  Musculoskeletal:     Right lower leg: No swelling.     Left lower leg: No swelling.  Skin:    General: Skin is warm.     Findings: No rash.   Neurological:     Mental Status: He is alert.      Scheduled Meds:  aspirin  81 mg Oral Daily   atorvastatin  80 mg Oral Daily   azithromycin  500 mg Oral Daily   Followed by   Derrill Memo ON 11/06/2020] azithromycin  250 mg Oral Daily   carvedilol  3.125 mg Oral BID WC   dextromethorphan-guaiFENesin  1 tablet Oral BID   enoxaparin (LOVENOX) injection  40 mg Subcutaneous Q24H   gabapentin  100 mg Oral Daily   influenza vaccine adjuvanted  0.5 mL Intramuscular Tomorrow-1000   ipratropium-albuterol  3 mL Nebulization Q6H   levothyroxine  25 mcg Oral Q0600   methylPREDNISolone (SOLU-MEDROL) injection  80 mg Intravenous Q12H   pantoprazole  40 mg Oral Daily   Continuous Infusions:  ceFEPime (MAXIPIME) IV 200 mL/hr at 11/05/20 1542    Assessment/Plan:  Acute on chronic hypoxic hypercapnic respiratory failure.  Patient on 5 L of oxygen this morning and chronically wears 2 L.  Candidate for noninvasive ventilation at night at home.  PCO2 88. COPD exacerbation and postobstructive pneumonia.  On Solu-Medrol and antibiotics.  Continue Maxipime.  Discontinue vancomycin and start Zithromax. Left lung mass 5.9 x 4.9 x 5.8 cm.  Last hospitalization they were being set up at [facility] by Dr. Wynonia Musty for a  biopsy. Unsteady gait.  With history of lung mass we will get an MRI of the brain. Obstructive sleep apnea on BiPAP at night GERD on PPI Essential hypertension on Coreg Hyperlipidemia unspecified on atorvastatin Weakness.  Physical therapy evaluate        Code Status:     Code Status Orders  (From admission, onward)           Start     Ordered   11/03/20 1941  Do not attempt resuscitation (DNR)  Continuous       Question Answer Comment  In the event of cardiac or respiratory ARREST Do not call a "code blue"   In the event of cardiac or respiratory ARREST Do not perform Intubation, CPR, defibrillation or ACLS   In the event of cardiac or respiratory ARREST Use medication by any  route, position, wound care, and other measures to relive pain and suffering. May use oxygen, suction and manual treatment of airway obstruction as needed for comfort.      11/03/20 1940           Code Status History     Date Active Date Inactive Code Status Order ID Comments User Context   10/27/2020 2138 10/30/2020 1807 DNR [id number]  Kayleen Memos, DO ED   10/27/2020 2056 10/27/2020 2138 Full Code [id number]  Kayleen Memos, DO ED   11/29/2016 0020 12/01/2016 2049 Full Code [id number]  Lance Coon, MD Inpatient      Family Communication: Spoke with wife at the phone Disposition Plan: Status is: Inpatient  Dispo: The patient is from: Home              Anticipated d/c is to: Home              Patient currently on higher amount of oxygen than usual 5 L.  Normally wears 2.  Would like to get him back down to his normal oxygen level.   Difficult to place patient.  No.  Antibiotics:   Time spent: 28 minutes    [place]

## 2020-11-05 NOTE — Evaluation (Signed)
Physical Therapy Evaluation Patient Details Name: Chris Lozano MRN: [id number] DOB: Oct 11, 1940 Today's Date: 11/05/2020  History of Present Illness  80 y.o. male with medical history significant for coronary artery disease, PVD, aortic atherosclerosis, COPD/emphysema, chronic hypoxic respiratory failure on 2-3 L continuously at baseline, hyperlipidemia, chronic anxiety/depression, iron deficiency anemia, chronic thrombocytopenia, hypothyroidism, BPH.  Pt was here last week with a few weeks of dizziness/unsteadiness - went home and apparently has continued to have limited activity/dizziness/weakness - now returns with COPD exacerbation.  Clinical Impression  Pt with no overt shortness of breath t/o the session, but sats remained in the 87-92% range on 5-6L t/o the session.  He was able to get to sitting and then standing w/o assist and despite some impulsivity/safety awareness issues he was ultimately able to ambulate ~200 ft with FWW.  Pt did, despite cuing, go too fast and keep walker too far out in front but he had no LOBs, no stagger stepping and no symptoms of dizziness or lightheadness with the prolonged ambulation effort.       Recommendations for follow up therapy are one component of a multi-disciplinary discharge planning process, led by the attending physician.  Recommendations may be updated based on patient status, additional functional criteria and insurance authorization.  Follow Up Recommendations Home health PT    Equipment Recommendations  None recommended by PT    Recommendations for Other Services       Precautions / Restrictions Precautions Precautions: Fall Restrictions Weight Bearing Restrictions: No      Mobility  Bed Mobility Overal bed mobility: Modified Independent             General bed mobility comments: able to transition to EOB with relative ease    Transfers Overall transfer level: Modified independent Equipment used: Rolling walker (2  wheeled) Transfers: Sit to/from Stand Sit to Stand: Supervision         General transfer comment: Cuing to insure slow and safe transition to standing, able to rise w/o assist.  Ambulation/Gait Ambulation/Gait assistance: Supervision Gait Distance (Feet): 200 Feet Assistive device: Rolling walker (2 wheeled)       General Gait Details: Pt was able to ambulate w/o AD on recent admission, encouraged use today as he has been weak.  Ultimately he did relatively well, he maintained walker too far in front of him and despite cuing had increased gait speed but was able to ambulate ~200 ft with relative ease.  On 6L during the effort with sats 88-92% and only minimal c/o fatigue.  Stairs            Wheelchair Mobility    Modified Rankin (Stroke Patients Only)       Balance Overall balance assessment: Needs assistance Sitting-balance support: No upper extremity supported;Feet supported Sitting balance-Leahy Scale: Good     Standing balance support: Bilateral upper extremity supported Standing balance-Leahy Scale: Fair Standing balance comment: Self selecting keeping walker too far out in front of him.  He did not have any LOBs but did not show great safety awareness with impulsivity being the biggest issue regarding safety/balance.                             Pertinent Vitals/Pain Pain Assessment: No/denies pain    Home Living Family/patient expects to be discharged to:: Private residence Living Arrangements: Spouse/significant other Available Help at Discharge: Family;Available 24 hours/day   Home Access: Stairs to enter Entrance Stairs-Rails: Can reach  both Entrance Stairs-Number of Steps: 4 Home Layout: One level Home Equipment: Grab bars - tub/shower;Shower seat;Walker - 2 wheels      Prior Function Level of Independence: Independent         Comments: Apart from using 2-3L O2 he isapparently able to do most ADLs and functional mobility (even w/o  AD). Has been less active recently.     Hand Dominance        Extremity/Trunk Assessment   Upper Extremity Assessment Upper Extremity Assessment: Generalized weakness    Lower Extremity Assessment Lower Extremity Assessment: Generalized weakness       Communication   Communication: No difficulties  Cognition Arousal/Alertness: Awake/alert Behavior During Therapy: Impulsive Overall Cognitive Status: Impaired/Different from baseline                                 General Comments: wife indicates that he sometimes has some confusion but is more confused and impulsive than normal at this time      General Comments General comments (skin integrity, edema, etc.): Pt with no dizziness or complaints of lightheadedness    Exercises     Assessment/Plan    PT Assessment Patient needs continued PT services  PT Problem List Decreased strength;Decreased range of motion;Decreased activity tolerance;Decreased balance;Decreased mobility;Decreased coordination;Decreased safety awareness;Cardiopulmonary status limiting activity       PT Treatment Interventions DME instruction;Gait training;Stair training;Functional mobility training;Therapeutic activities;Therapeutic exercise;Balance training;Neuromuscular re-education    PT Goals (Current goals can be found in the Care Plan section)       Frequency Min 2X/week   Barriers to discharge        Co-evaluation               AM-PAC PT "6 Clicks" Mobility  Outcome Measure Help needed turning from your back to your side while in a flat bed without using bedrails?: None Help needed moving from lying on your back to sitting on the side of a flat bed without using bedrails?: None Help needed moving to and from a bed to a chair (including a wheelchair)?: None Help needed standing up from a chair using your arms (e.g., wheelchair or bedside chair)?: None Help needed to walk in hospital room?: A Little Help needed  climbing 3-5 steps with a railing? : A Lot 6 Click Score: 21    End of Session Equipment Utilized During Treatment: Gait belt;Oxygen (5-6L O2) Activity Tolerance: Patient limited by fatigue Patient left: with chair alarm set;with call bell/phone within reach;with family/visitor present Nurse Communication: Mobility status (O2 status with activity) PT Visit Diagnosis: Difficulty in walking, not elsewhere classified (R26.2);Unsteadiness on feet (R26.81)    Time: 1683-7290 PT Time Calculation (min) (ACUTE ONLY): 25 min   Charges:   PT Evaluation $PT Eval Low Complexity: 1 Low PT Treatments $Gait Training: 8-22 mins        Kreg Shropshire, DPT 11/05/2020, 5:27 PM

## 2020-11-06 DIAGNOSIS — J189 Pneumonia, unspecified organism: Secondary | ICD-10-CM

## 2020-11-06 LAB — BLOOD GAS, ARTERIAL
Acid-Base Excess: 7.3 mmol/L — ABNORMAL HIGH (ref 0.0–2.0)
Bicarbonate: 33.1 mmol/L — ABNORMAL HIGH (ref 20.0–28.0)
FIO2: 0.36
O2 Saturation: 92.2 %
Patient temperature: 37
pCO2 arterial: 51 mmHg — ABNORMAL HIGH (ref 32.0–48.0)
pH, Arterial: 7.42 (ref 7.350–7.450)
pO2, Arterial: 63 mmHg — ABNORMAL LOW (ref 83.0–108.0)

## 2020-11-06 LAB — CULTURE, RESPIRATORY W GRAM STAIN

## 2020-11-06 MED ORDER — ASPIRIN EC 81 MG PO TBEC
81.0000 mg | DELAYED_RELEASE_TABLET | Freq: Every day | ORAL | Status: DC
  Administered 2020-11-07: 81 mg via ORAL
  Filled 2020-11-06: qty 1

## 2020-11-06 MED ORDER — METHYLPREDNISOLONE SODIUM SUCC 40 MG IJ SOLR
40.0000 mg | Freq: Two times a day (BID) | INTRAMUSCULAR | Status: DC
  Administered 2020-11-06 – 2020-11-07 (×3): 40 mg via INTRAVENOUS
  Filled 2020-11-06 (×3): qty 1

## 2020-11-06 NOTE — Care Management Important Message (Signed)
Important Message  Patient Details  Name: Chris Lozano MRN: [id number] Date of Birth: 1940/05/11   Medicare Important Message Given:  Yes     Dannette Barbara 11/06/2020, 4:14 PM

## 2020-11-06 NOTE — Consult Note (Addendum)
Pharmacy Antibiotic Note  Chris Lozano is a 80 y.o. male with PMH of COPD, hyperlipidemia, GERD, CAD,left lower lobe mass who was admitted on 11/03/2020 with pneumonia/HCAP.  Pharmacy has been consulted for Cefepime dosing.  Pt was recently admitted from 9/19-9/22 for postobstructive pneumonia, for which he was discharged on Augmentin for 14 days.  Afebrile since admin, WBC 13.1>9.8>16.5, procal<0.10  Bcx NG x3 days, MRSA PCR not detected, sputum cx - Candida tropicals - MD deferred treatment   Plan: Cefepime --Will continue cefepime 2g IV q8h --Spoke with MD regarding possible discontinuation prior to 10/2, MD wants to keep pending evaluation today (9/29) and possibly d/c on PO  Will continue to monitor renal function and cultures to adjust dose or de-escalate therapy as clinically indicated    Height: 5\' 6"  (167.6 cm) Weight: 76.9 kg (169 lb 8.5 oz) IBW/kg (Calculated) : 63.8  Temp (24hrs), Avg:97.6 F (36.4 C), Min:97.4 F (36.3 C), Max:97.9 F (36.6 C)  Recent Labs  Lab 11/03/20 1703 11/03/20 1945 11/04/20 0619 11/05/20 0335  WBC 13.1*  --  9.8 16.5*  CREATININE 0.90  --  0.79 0.86  LATICACIDVEN 1.0 1.1  --   --      Estimated Creatinine Clearance: 65.7 mL/min (by C-G formula based on SCr of 0.86 mg/dL).    Allergies  Allergen Reactions   Fluocinolone Other (See Comments)    Reaction: dizzines, "drunk" Other reaction(s): Unknown   Ciprofloxacin Hcl     Dizzy     Antimicrobials this admission: Ongoing Cefepime 9/26 >> Azithromycin 9/28 >>  Completed  Zosyn 3.375g x 1 Vancomcyin 9/26 >> 9/27  Dose adjustments this admission: Cefepime q12h>q8h  Microbiology results: 9/26 BCx: NG x3 days 9/26 Sputum: pending  9/26 MRSA PCR: not detected   Thank you for allowing pharmacy to be a part of this patient's care.  Narda Rutherford, PharmD Pharmacy Resident  11/06/2020 12:43 PM

## 2020-11-06 NOTE — Progress Notes (Signed)
Patient ID: KEE DRUDGE, male   DOB: February 15, 1940, 80 y.o.   MRN: [id number] [facility] PROGRESS NOTE  TYWON NIDAY BMW:[id number] DOB: November 26, 1940 DOA: 11/03/2020 PCP: Adin Hector, MD  HPI/Subjective: Patient still has some shortness of breath.  This morning he was still on 5 L of oxygen.  Admitted with shortness of breath and found to have acute on chronic hypoxic hypercapnic respiratory failure and postobstructive pneumonia.  Objective: Vitals:   11/06/20 0738 11/06/20 0836  BP:  114/71  Pulse:  72  Resp:  18  Temp:  97.9 F (36.6 C)  SpO2: 92% 96%    Intake/Output Summary (Last 24 hours) at 11/06/2020 1241 Last data filed at 11/06/2020 0502 Gross per 24 hour  Intake 298.45 ml  Output --  Net 298.45 ml   Filed Weights   11/03/20 1730 11/05/20 0403  Weight: 72.6 kg 76.9 kg    ROS: Review of Systems  Respiratory:  Positive for cough and shortness of breath.   Cardiovascular:  Negative for chest pain.  Gastrointestinal:  Negative for abdominal pain, nausea and vomiting.  Exam: Physical Exam HENT:     Head: Normocephalic.     Mouth/Throat:     Pharynx: No oropharyngeal exudate.  Eyes:     General: Lids are normal.     Conjunctiva/sclera: Conjunctivae normal.  Cardiovascular:     Rate and Rhythm: Normal rate and regular rhythm.     Heart sounds: Normal heart sounds, S1 normal and S2 normal.  Pulmonary:     Breath sounds: Examination of the right-lower field reveals decreased breath sounds and rhonchi. Examination of the left-lower field reveals decreased breath sounds and rhonchi. Decreased breath sounds and rhonchi present. No wheezing or rales.  Abdominal:     Palpations: Abdomen is soft.     Tenderness: There is no abdominal tenderness.  Musculoskeletal:     Right lower leg: No swelling.     Left lower leg: No swelling.  Skin:    General: Skin is warm.     Findings: No rash.  Neurological:     Mental Status: He is alert.     Comments: Answers all  questions appropriately.      Scheduled Meds:  [START ON 11/07/2020] aspirin EC  81 mg Oral Daily   atorvastatin  80 mg Oral Daily   azithromycin  250 mg Oral Daily   carvedilol  3.125 mg Oral BID WC   dextromethorphan-guaiFENesin  1 tablet Oral BID   enoxaparin (LOVENOX) injection  40 mg Subcutaneous Q24H   gabapentin  100 mg Oral Daily   influenza vaccine adjuvanted  0.5 mL Intramuscular Tomorrow-1000   ipratropium-albuterol  3 mL Nebulization Q6H   levothyroxine  25 mcg Oral Q0600   methylPREDNISolone (SOLU-MEDROL) injection  40 mg Intravenous Q12H   pantoprazole  40 mg Oral Daily   Continuous Infusions:  ceFEPime (MAXIPIME) IV 2 g (11/06/20 0506)    Assessment/Plan:  Acute on chronic hypoxic hypercapnic respiratory failure.  Patient chronically wears 2 L of oxygen and was on 5 L this morning.  I dialed him down to about 3-1/2 L.  Since the patient is moving better air hopefully can get back to his baseline 2 L of oxygen.  With PCO2 of 88, the patient is a candidate for noninvasive ventilation at home. COPD exacerbation postobstructive pneumonia.  Increase Solu-Medrol 40 mg twice daily.  On Maxipime and Zithromax. Lung mass.  5.9 x 4.9 x 5.8 cm.  Case discussed with  interventional radiology and they do not want to put a needle in this mass without getting an outpatient PET scan first.  Case discussed with patient's daughter on whether or not they want to follow-up at [facility] or here.  She will let me know soon. Unsteady gait.  Appreciate physical therapy consult.  MRI of the brain negative for mass or stroke. Obstructive sleep apnea on BiPAP at night here GERD on PPI Essential hypertension on Coreg Hyperlipidemia unspecified on atorvastatin    Code Status:     Code Status Orders  (From admission, onward)           Start     Ordered   11/03/20 1941  Do not attempt resuscitation (DNR)  Continuous       Question Answer Comment  In the event of cardiac or respiratory ARREST  Do not call a "code blue"   In the event of cardiac or respiratory ARREST Do not perform Intubation, CPR, defibrillation or ACLS   In the event of cardiac or respiratory ARREST Use medication by any route, position, wound care, and other measures to relive pain and suffering. May use oxygen, suction and manual treatment of airway obstruction as needed for comfort.      11/03/20 1940           Code Status History     Date Active Date Inactive Code Status Order ID Comments User Context   10/27/2020 2138 10/30/2020 1807 DNR [id number]  Kayleen Memos, DO ED   10/27/2020 2056 10/27/2020 2138 Full Code [id number]  Kayleen Memos, DO ED   11/29/2016 0020 12/01/2016 2049 Full Code [id number]  Lance Coon, MD Inpatient      Family Communication: Spoke with wife at the bedside and daughter on the phone Disposition Plan: Status is: Inpatient  Dispo: The patient is from: Home              Anticipated d/c is to: Home              Patient currently being treated for post obstructive pneumonia and COPD exacerbation and acute on chronic hypoxic hypercapnic respiratory failure.  Trying to get his oxygen down to his baseline.   Difficult to place patient.  No  Antibiotics: Maxipime Zithromax  Time spent: 28 minutes  [place]

## 2020-11-07 ENCOUNTER — Encounter: Payer: Self-pay | Admitting: *Deleted

## 2020-11-07 DIAGNOSIS — J9602 Acute respiratory failure with hypercapnia: Secondary | ICD-10-CM | POA: Diagnosis not present

## 2020-11-07 DIAGNOSIS — R918 Other nonspecific abnormal finding of lung field: Secondary | ICD-10-CM

## 2020-11-07 DIAGNOSIS — J9601 Acute respiratory failure with hypoxia: Secondary | ICD-10-CM | POA: Diagnosis not present

## 2020-11-07 MED ORDER — CEFDINIR 300 MG PO CAPS: 300.0000 mg | ORAL_CAPSULE | Freq: Two times a day (BID) | ORAL | 0 refills | Status: AC

## 2020-11-07 MED ORDER — PREDNISONE 20 MG PO TABS: ORAL_TABLET | ORAL | 0 refills | Status: DC

## 2020-11-07 NOTE — TOC Transition Note (Addendum)
Transition of Care [facility]) - CM/SW Discharge Note   Patient Details  Name: Chris Lozano MRN: [id number] Date of Birth: 1940-11-22  [facility] CM/SW Contact:  Magnus Ivan, LCSW Phone Number: 11/07/2020, 12:04 PM   Clinical Narrative:    Spoke to patient's daughter [name]. Patient lives with his wife. Daughter or wife provide transportation. PCP is [name]. Pharmacy is [facility]. Patient has oxygen, a shower chair, and a RW. Confirmed home address. Daughter is agreeable to [facility] referral, no HH history.   Confirmed patient has a bipap at home. Daughter sent photo and [employer] Rep Thedore Mins confirmed. Per MD, ok to continue with [facility] ordering and use bipap at home until that is arranged. Updated Zach with [employer].   [facility] accepted for PT services. Asked MD for orders.    Final next level of care: [place] Barriers to Discharge: Barriers Resolved   Patient Goals and CMS Choice Patient states their goals for this hospitalization and ongoing recovery are:: home with home health CMS Medicare.gov Compare Post Acute Care list provided to:: Patient Represenative (must comment) Choice offered to / list presented to : Adult Children  Discharge Placement                    Patient and family notified of of transfer: 11/07/20  Discharge Plan and Services                          HH Arranged: PT [facility] Agency: [place] Date [place]: 11/07/20   Representative spoke with at [place]: [place] ([place]) Interventions     Readmission Risk Interventions No flowsheet data found.

## 2020-11-07 NOTE — Progress Notes (Signed)
Chris Lozano presents with acute on chronic hypoxic hypercapnic respiratory failure secondary to COPD. The use of the NIV will treat patients high PC02 levels (51 with elevated Bicarbonate of 33.1 on 11/06/20) and can reduce risk of exacerbations and future hospitalizations (this readmission comes 4 days following his last admissions discharge date) when used at night and during the day.  All alternate devices [id number] and [id number]) have been proven ineffective to provide essential volume control necessary to maintain acceptable CO2 levels.  An NIV with AVAPS AE is necessary to prevent patient from life-threatening harm.  Interruption or failure to provide NIV would quickly lead to exacerbation of the patients condition, hospital admission, and likely harm to the patient. Continued use is preferred.  Patient is able to protect their airways and clear secretions on their own.  [place], [place]

## 2020-11-07 NOTE — Progress Notes (Signed)
Pt currently admitted and will need outpatient PET scan and CT guided lung biopsy after discharge per Dr. Grayland Ormond. Orders placed. Authorization needed from insurance for PET scan. Once approved, will schedule pt for PET scan and notify pt with appt details. Biopsy and further follow up will be scheduled after PET scan.

## 2020-11-07 NOTE — Discharge Summary (Signed)
Chris [place] NAME: Chris [place]    MR#:  [id number]  DATE OF BIRTH:  1941-01-04  DATE OF ADMISSION:  11/03/2020 ADMITTING PHYSICIAN: Wyvonnia Dusky, MD  DATE OF DISCHARGE: 11/07/2020 12:37 PM  PRIMARY CARE PHYSICIAN: Adin Hector, MD    ADMISSION DIAGNOSIS:  Respiratory failure ([place]) [J96.90] COPD exacerbation ([place]) [J44.1] Acute on chronic respiratory failure ([place]) [J96.20] Acute on chronic respiratory failure with hypercapnia ([place]) [J96.22]  DISCHARGE DIAGNOSIS:  Principal Problem:   Acute respiratory failure with hypoxia and hypercapnia (HCC) Active Problems:   COPD exacerbation (HCC)   CAD (coronary artery disease)   HLD (hyperlipidemia)   Obstructive sleep apnea   GERD (gastroesophageal reflux disease)   Mass of left lung   Respiratory failure ([place])   Unsteady gait   Essential hypertension   Pneumonia of left lower lobe due to infectious organism   SECONDARY DIAGNOSIS:   Past Medical History:  Diagnosis Date  . Asthma   . BPH (benign prostatic hyperplasia)   . CAD (coronary artery disease)   . COPD (chronic obstructive pulmonary disease) ([place])   . GERD (gastroesophageal reflux disease)   . HLD (hyperlipidemia)   . Sleep apnea     HOSPITAL COURSE:   Acute on chronic hypoxic hypercapnic respiratory failure.  Patient chronically on 2 L.  He was on 5 L of oxygen during the hospital course.  Able to be tapered down to 2 L.  With the patient's PCO2 of 88 the patient is a candidate for noninvasive ventilation at home.  I thought the patient had a CPAP but he actually has a BiPAP.  Spoke with transitional care team and we will try to switch over to trilogy machine at home. COPD exacerbation with postobstructive pneumonia.  Switch over to prednisone for a couple more days upon going home home.  Patient switched over to [facility] upon going home.  Completed Zithromax.  Do not treat yeast in sputum culture Lung mass.   5.9 x 4.9 x 5.8 cm.  I discussed the case with interventional radiology and they did not want to put a needle in this mass while here in the hospital.  They recommended an outpatient PET scan.  I consulted Dr. Grayland Ormond oncology and he will set up with PET scan and biopsy as outpatient.  Close clinical follow-up as outpatient needed. Unsteady gait.  MRI of the brain negative for mass or stroke.  Home health PT recommended Obstructive sleep apnea.  Patient on BiPAP at home we will try to switch over to trilogy machine. Essential hypertension on Coreg Hyperlipidemia unspecified on atorvastatin  DISCHARGE CONDITIONS:   Satisfactory  CONSULTS OBTAINED:  Treatment Team:  Lloyd Huger, MD  DRUG ALLERGIES:   Allergies  Allergen Reactions  . Fluocinolone Other (See Comments)    Reaction: dizzines, "drunk" Other reaction(s): Unknown  . Ciprofloxacin Hcl     Dizzy     DISCHARGE MEDICATIONS:   Allergies as of 11/07/2020       Reactions   Fluocinolone Other (See Comments)   Reaction: dizzines, "drunk" Other reaction(s): Unknown   Ciprofloxacin Hcl    Dizzy         Medication List     STOP taking these medications    amoxicillin-clavulanate 875-125 MG tablet Commonly known as: AUGMENTIN   mirtazapine 15 MG tablet Commonly known as: REMERON   mirtazapine 30 MG tablet Commonly known as: REMERON   oxybutynin 5 MG 24 hr  tablet Commonly known as: DITROPAN-XL   tamsulosin 0.4 MG Caps capsule Commonly known as: FLOMAX       TAKE these medications    albuterol (2.5 MG/3ML) 0.083% nebulizer solution Commonly known as: PROVENTIL Take 2.5 mg by nebulization every 6 (six) hours as needed for wheezing or shortness of breath.   albuterol 108 (90 Base) MCG/ACT inhaler Commonly known as: VENTOLIN HFA Inhale 2 puffs into the lungs every 6 (six) hours as needed for wheezing or shortness of breath.   aspirin EC 81 MG tablet Take 81 mg by mouth daily.   atorvastatin 80  MG tablet Commonly known as: LIPITOR Take 80 mg by mouth daily.   carvedilol 3.125 MG tablet Commonly known as: COREG Take 3.125 mg by mouth 2 (two) times daily.   cefdinir 300 MG capsule Commonly known as: OMNICEF Take 1 capsule (300 mg total) by mouth 2 (two) times daily for 2 days.   formoterol 20 MCG/2ML nebulizer solution Commonly known as: PERFOROMIST Inhale 20 mcg into the lungs 2 (two) times daily.   gabapentin 100 MG capsule Commonly known as: NEURONTIN Take 100 mg by mouth daily.   levothyroxine 25 MCG tablet Commonly known as: SYNTHROID Take 25 mcg by mouth daily before breakfast.   predniSONE 20 MG tablet Commonly known as: DELTASONE Two tabs po daily for two days   Tiotropium Bromide Monohydrate 2.5 MCG/ACT Aers Inhale 2 puffs into the lungs daily.   VITAMIN D3 PO Take 1 tablet by mouth daily.   ZINC PO Take 1 tablet by mouth daily.         DISCHARGE INSTRUCTIONS:   Follow-up PMD 5 days Follow-up oncology to set up PET CT scan and biopsy Follow-up pulmonology  If you experience worsening of your admission symptoms, develop shortness of breath, life threatening emergency, suicidal or homicidal thoughts you must seek medical attention immediately by calling 911 or calling your MD immediately  if symptoms less severe.  You Must read complete instructions/literature along with all the possible adverse reactions/side effects for all the Medicines you take and that have been prescribed to you. Take any new Medicines after you have completely understood and accept all the possible adverse reactions/side effects.   Please note  You were cared for by a hospitalist during your hospital stay. If you have any questions about your discharge medications or the care you received while you were in the hospital after you are discharged, you can call the unit and asked to speak with the hospitalist on call if the hospitalist that took care of you is not available.  Once you are discharged, your primary care physician will handle any further medical issues. Please note that NO REFILLS for any discharge medications will be authorized once you are discharged, as it is imperative that you return to your primary care physician (or establish a relationship with a primary care physician if you do not have one) for your aftercare needs so that they can reassess your need for medications and monitor your lab values.    Today   CHIEF COMPLAINT:   Chief Complaint  Patient presents with  . Shortness of Breath    HISTORY OF PRESENT ILLNESS:  Ryosuke Ericksen  is a 80 y.o. male came in with shortness of breath   VITAL SIGNS:  Blood pressure 126/72, pulse 66, temperature 98.3 F (36.8 C), temperature source Oral, resp. rate 18, height 5\' 6"  (1.676 m), weight 76.9 kg, SpO2 93 %.  I/O:   Intake/Output  Summary (Last 24 hours) at 11/07/2020 1617 Last data filed at 11/07/2020 0950 Gross per 24 hour  Intake 1009.53 ml  Output --  Net 1009.53 ml    PHYSICAL EXAMINATION:  GENERAL:  80 y.o.-year-old patient lying in the bed with no acute distress.  EYES: Pupils equal, round, reactive to light and accommodation. No scleral icterus.  HEENT: Head atraumatic, normocephalic. Oropharynx and nasopharynx clear.  LUNGS: Decreased breath sounds bilaterally, slight expiratory wheezing at the bases.  No rales,rhonchi or crepitation. No use of accessory muscles of respiration.  CARDIOVASCULAR: S1, S2 normal. No murmurs, rubs, or gallops.  ABDOMEN: Soft, non-tender, non-distended.  EXTREMITIES: No pedal edema.  NEUROLOGIC: Cranial nerves II through XII are intact. Muscle strength 5/5 in all extremities. Sensation intact. Gait not checked.  PSYCHIATRIC: The patient is alert and oriented x 3.  SKIN: No obvious rash, lesion, or ulcer.   DATA REVIEW:   CBC Recent Labs  Lab 11/05/20 0335  WBC 16.5*  HGB 11.4*  HCT 34.6*  PLT 174    Chemistries  Recent Labs  Lab  11/04/20 0619 11/05/20 0335  NA 138 138  K 4.6 4.4  CL 97* 101  CO2 33* 34*  GLUCOSE 157* 114*  BUN 18 21  CREATININE 0.79 0.86  CALCIUM 8.2* 8.0*  MG  --  1.9  AST 28  --   ALT 24  --   ALKPHOS 73  --   BILITOT 0.9  --       Microbiology Results  Results for orders placed or performed during the hospital encounter of 11/03/20  Blood culture (routine x 2)     Status: None (Preliminary result)   Collection Time: 11/03/20  5:03 PM   Specimen: BLOOD  Result Value Ref Range Status   Specimen Description BLOOD LEFT ANTECUBITAL  Final   Special Requests   Final    BOTTLES DRAWN AEROBIC AND ANAEROBIC Blood Culture adequate volume   Culture   Final    NO GROWTH 4 DAYS Performed at [facility], [address], [place], [place] [address]    Report Status PENDING  Incomplete  Expectorated Sputum Assessment w Gram Stain, Rflx to Resp Cult     Status: None   Collection Time: 11/03/20  5:03 PM   Specimen: Expectorated Sputum  Result Value Ref Range Status   Specimen Description EXPECTORATED SPUTUM  Final   Special Requests NONE  Final   Sputum evaluation   Final    THIS SPECIMEN IS ACCEPTABLE FOR SPUTUM CULTURE Performed at [facility], [address], [place], [place] [address]    Report Status 11/03/2020 FINAL  Final  Culture, Respiratory w Gram Stain     Status: None   Collection Time: 11/03/20  5:03 PM  Result Value Ref Range Status   Specimen Description   Final    EXPECTORATED SPUTUM Performed at [facility], [address], [place], [place] [address]    Special Requests   Final    NONE Reflexed from [phone] Performed at [facility], [address] [place], [place] [address]    Gram Stain   Final    FEW SQUAMOUS EPITHELIAL CELLS PRESENT FEW WBC SEEN FEW YEAST WITH PSEUDOHYPHAE Performed at [facility], [place] [address] [place], [place] [address]    Culture MODERATE CANDIDA TROPICALIS  Final   Report Status  11/06/2020 FINAL  Final  Blood culture (routine x 2)     Status: None (Preliminary result)  Collection Time: 11/03/20  5:48 PM   Specimen: BLOOD  Result Value Ref Range Status   Specimen Description BLOOD BLOOD RIGHT HAND  Final   Special Requests   Final    BOTTLES DRAWN AEROBIC AND ANAEROBIC Blood Culture adequate volume   Culture   Final    NO GROWTH 4 DAYS Performed at [facility] [place], [address], [place], [place] [address]    Report Status PENDING  Incomplete  Resp Panel by RT-PCR (Flu A&B, Covid) Nasopharyngeal Swab     Status: None   Collection Time: 11/03/20  7:45 PM   Specimen: Nasopharyngeal Swab; Nasopharyngeal(NP) swabs in vial transport medium  Result Value Ref Range Status   SARS Coronavirus 2 by RT PCR NEGATIVE NEGATIVE Final    Comment: (NOTE) SARS-CoV-2 target nucleic acids are NOT DETECTED.  The SARS-CoV-2 RNA is generally detectable in upper respiratory specimens during the acute phase of infection. The lowest concentration of SARS-CoV-2 viral copies this assay can detect is 138 copies/mL. A negative result does not preclude SARS-Cov-2 infection and should not be used as the sole basis for treatment or other patient management decisions. A negative result may occur with  improper specimen collection/handling, submission of specimen other than nasopharyngeal swab, presence of viral mutation(s) within the areas targeted by this assay, and inadequate number of viral copies(<138 copies/mL). A negative result must be combined with clinical observations, patient history, and epidemiological information. The expected result is Negative.  Fact Sheet for Patients:  EntrepreneurPulse.com.au  Fact Sheet for Healthcare Providers:  IncredibleEmployment.be  This test is no t yet approved or cleared by the [place] FDA and  has been authorized for detection and/or diagnosis of SARS-CoV-2 by FDA under an Emergency Use  Authorization (EUA). This EUA will remain  in effect (meaning this test can be used) for the duration of the COVID-19 declaration under Section 564(b)(1) of the Act, 21 U.S.C.section 360bbb-3(b)(1), unless the authorization is terminated  or revoked sooner.       Influenza A by PCR NEGATIVE NEGATIVE Final   Influenza B by PCR NEGATIVE NEGATIVE Final    Comment: (NOTE) The Xpert Xpress SARS-CoV-2/FLU/RSV plus assay is intended as an aid in the diagnosis of influenza from Nasopharyngeal swab specimens and should not be used as a sole basis for treatment. Nasal washings and aspirates are unacceptable for Xpert Xpress SARS-CoV-2/FLU/RSV testing.  Fact Sheet for Patients: EntrepreneurPulse.com.au  Fact Sheet for Healthcare Providers: IncredibleEmployment.be  This test is not yet approved or cleared by the [place] FDA and has been authorized for detection and/or diagnosis of SARS-CoV-2 by FDA under an Emergency Use Authorization (EUA). This EUA will remain in effect (meaning this test can be used) for the duration of the COVID-19 declaration under Section 564(b)(1) of the Act, 21 U.S.C. section 360bbb-3(b)(1), unless the authorization is terminated or revoked.  Performed at [facility], [address] [place], [place] [address]   MRSA Next Gen by PCR, Nasal     Status: None   Collection Time: 11/05/20 11:55 AM   Specimen: Nasal Mucosa; Nasal Swab  Result Value Ref Range Status   MRSA by PCR Next Gen NOT DETECTED NOT DETECTED Final    Comment: (NOTE) The GeneXpert MRSA Assay (FDA approved for NASAL specimens only), is one component of a comprehensive MRSA colonization surveillance program. It is not intended to diagnose MRSA infection nor to guide or monitor treatment for MRSA infections. Test performance is not FDA approved in patients less than  34 years old. Performed at [facility], [address]  [place], [place] [address]     RADIOLOGY:  MR BRAIN W WO CONTRAST  Result Date: 11/05/2020 CLINICAL DATA:  Non-small-cell lung cancer staging EXAM: MRI HEAD WITHOUT AND WITH CONTRAST TECHNIQUE: Multiplanar, multiecho pulse sequences of the brain and surrounding structures were obtained without and with intravenous contrast. CONTRAST:  81mL GADAVIST GADOBUTROL 1 MMOL/ML IV SOLN COMPARISON:  CT head 10/27/2020 FINDINGS: Brain: No acute infarction, hemorrhage, hydrocephalus, extra-axial collection or mass lesion. Generalized atrophy. Mild to moderate white matter hyperintensity bilaterally. Hyperintensity in the central pons. Normal enhancement.  Negative for metastatic disease to the brain. Vascular: Normal arterial flow voids at the skull base Skull and upper cervical spine: Negative Sinuses/Orbits: Mild mucosal edema paranasal sinuses. Bilateral cataract extraction Other: None IMPRESSION: Negative for metastatic disease to brain Atrophy and chronic microvascular ischemic change in the white matter. Electronically Signed   By: Franchot Gallo M.D.   On: 11/05/2020 19:51      Management plans discussed with the patient, family and they are in agreement.  CODE STATUS:     Code Status Orders  (From admission, onward)           Start     Ordered   11/03/20 1941  Do not attempt resuscitation (DNR)  Continuous       Question Answer Comment  In the event of cardiac or respiratory ARREST Do not call a "code blue"   In the event of cardiac or respiratory ARREST Do not perform Intubation, CPR, defibrillation or ACLS   In the event of cardiac or respiratory ARREST Use medication by any route, position, wound care, and other measures to relive pain and suffering. May use oxygen, suction and manual treatment of airway obstruction as needed for comfort.      11/03/20 1940           Code Status History     Date Active Date Inactive Code Status Order ID Comments User Context   10/27/2020 2138  10/30/2020 1807 DNR [id number]  Kayleen Memos, DO ED   10/27/2020 2056 10/27/2020 2138 Full Code [id number]  Kayleen Memos, DO ED   11/29/2016 0020 12/01/2016 2049 Full Code [id number]  Lance Coon, MD Inpatient       TOTAL TIME TAKING CARE OF THIS PATIENT: 35 minutes.    Loletha Grayer M.D on 11/07/2020 at 4:17 PM  [facility]  CC: Primary care physician; Adin Hector, MD

## 2020-11-07 NOTE — Plan of Care (Signed)

## 2020-11-07 NOTE — Consult Note (Signed)
[place]  [phone] [phone]  ID: Chris Lozano OB: [birth year]  MR#: [id number]  NGE#:[id number]  Patient Care Team: Adin Hector, MD as PCP - General (Internal Medicine)  CHIEF COMPLAINT: Left lower lobe lung mass highly suspicious for malignancy.  INTERVAL HISTORY: Patient is an 80 year old male who initially presented emergency room with worsening shortness of breath.  Subsequent work-up included CT scan of the chest which revealed a large left lower lobe lung mass as well as mediastinal lymphadenopathy highly suspicious for underlying malignancy.  He continues to have shortness of breath, but feels improved since admission.  He has no neurologic complaints.  He denies any recent fevers.  He has a fair appetite, but denies weight loss.  He has no chest pain, cough, or hemoptysis.  He denies any nausea, vomiting, constipation, or diarrhea.  He has no urinary complaints.  Patient offers no further specific complaints today.  REVIEW OF SYSTEMS:   Review of Systems  Constitutional: Negative.  Negative for fever, malaise/fatigue and weight loss.  Respiratory:  Positive for shortness of breath. Negative for cough and hemoptysis.   Cardiovascular: Negative.  Negative for chest pain and leg swelling.  Gastrointestinal: Negative.  Negative for abdominal pain and nausea.  Genitourinary: Negative.  Negative for dysuria.  Musculoskeletal: Negative.  Negative for back pain.  Skin: Negative.  Negative for rash.  Neurological: Negative.  Negative for dizziness, focal weakness, weakness and headaches.  Psychiatric/Behavioral: Negative.  The patient is not nervous/anxious.    As per HPI. Otherwise, a complete review of systems is negative.  PAST MEDICAL HISTORY: Past Medical History:  Diagnosis Date   Asthma    BPH (benign prostatic hyperplasia)    CAD (coronary artery disease)    COPD (chronic obstructive pulmonary disease) (HCC)    GERD  (gastroesophageal reflux disease)    HLD (hyperlipidemia)    Sleep apnea     PAST SURGICAL HISTORY: Past Surgical History:  Procedure Laterality Date   ADENOIDECTOMY     CARDIAC CATHETERIZATION     GALLBLADDER SURGERY     LEFT HEART CATH AND CORONARY ANGIOGRAPHY Left 01/10/2019   Procedure: LEFT HEART CATH AND CORONARY ANGIOGRAPHY;  Surgeon: Teodoro Spray, MD;  Location: [facility];  Service: Cardiovascular;  Laterality: Left;   TONSILLECTOMY      FAMILY HISTORY: Family History  Problem Relation Age of Onset   Heart disease Other    Kidney disease Neg Hx    Prostate cancer Neg Hx    Kidney cancer Neg Hx    Bladder Cancer Neg Hx     ADVANCED DIRECTIVES (Y/N):  @ADVDIR @  HEALTH MAINTENANCE: Social History   Tobacco Use   Smoking status: Former   Smokeless tobacco: Former    Types: Chew   Tobacco comments:    as a teenager  Scientific laboratory technician Use: Never used  Substance Use Topics   Alcohol use: Yes    Alcohol/week: 0.0 standard drinks    Comment: occasionally   Drug use: No     Colonoscopy:  PAP:  Bone density:  Lipid panel:  Allergies  Allergen Reactions   Fluocinolone Other (See Comments)    Reaction: dizzines, "drunk" Other reaction(s): Unknown   Ciprofloxacin Hcl     Dizzy     Current Facility-Administered Medications  Medication Dose Route Frequency Provider Last Rate Last Admin   aspirin EC tablet 81 mg  81 mg Oral Daily Loletha Grayer, MD  81 mg at 11/07/20 0828   atorvastatin (LIPITOR) tablet 80 mg  80 mg Oral Daily Wyvonnia Dusky, MD   80 mg at 11/07/20 3903   carvedilol (COREG) tablet 3.125 mg  3.125 mg Oral BID WC Wyvonnia Dusky, MD   3.125 mg at 11/07/20 0092   ceFEPIme (MAXIPIME) 2 g in sodium chloride 0.9 % 100 mL IVPB  2 g Intravenous Q8H Rauer, Forde Dandy, RPH   Stopped at 11/07/20 0548   dextromethorphan-guaiFENesin (MUCINEX DM) 30-600 MG per 12 hr tablet 1 tablet  1 tablet Oral BID Wyvonnia Dusky, MD   1  tablet at 11/07/20 0830   enoxaparin (LOVENOX) injection 40 mg  40 mg Subcutaneous Q24H Gala Romney L, MD   40 mg at 11/06/20 2058   gabapentin (NEURONTIN) capsule 100 mg  100 mg Oral Daily Wyvonnia Dusky, MD   100 mg at 11/07/20 3300   ipratropium-albuterol (DUONEB) 0.5-2.5 (3) MG/3ML nebulizer solution 3 mL  3 mL Nebulization Q6H Wyvonnia Dusky, MD   3 mL at 11/07/20 0819   levothyroxine (SYNTHROID) tablet 25 mcg  25 mcg Oral Q0600 Wyvonnia Dusky, MD   25 mcg at 11/07/20 0516   methylPREDNISolone sodium succinate (SOLU-MEDROL) 40 mg/mL injection 40 mg  40 mg Intravenous Q12H Loletha Grayer, MD   40 mg at 11/07/20 0829   ondansetron (ZOFRAN) tablet 4 mg  4 mg Oral Q6H PRN Elwyn Reach, MD       Or   ondansetron (ZOFRAN) injection 4 mg  4 mg Intravenous Q6H PRN Elwyn Reach, MD       pantoprazole (PROTONIX) EC tablet 40 mg  40 mg Oral Daily Wyvonnia Dusky, MD   40 mg at 11/07/20 0828    OBJECTIVE: Vitals:   11/07/20 0733 11/07/20 0819  BP: 126/72   Pulse: 66   Resp:    Temp: 98.3 F (36.8 C)   SpO2: 93% 93%     Body mass index is 27.36 kg/m.    ECOG FS:1 - Symptomatic but completely ambulatory  General: Well-developed, well-nourished, no acute distress. Eyes: Pink conjunctiva, anicteric sclera. HEENT: Normocephalic, moist mucous membranes. Lungs: No audible wheezing or coughing. Heart: Regular rate and rhythm. Abdomen: Soft, nontender, no obvious distention. Musculoskeletal: No edema, cyanosis, or clubbing. Neuro: Alert, answering all questions appropriately. Cranial nerves grossly intact. Skin: No rashes or petechiae noted. Psych: Normal affect. Lymphatics: No cervical, calvicular, axillary or inguinal LAD.   LAB RESULTS:  Lab Results  Component Value Date   NA 138 11/05/2020   K 4.4 11/05/2020   CL 101 11/05/2020   CO2 34 (H) 11/05/2020   GLUCOSE 114 (H) 11/05/2020   BUN 21 11/05/2020   CREATININE 0.86 11/05/2020   CALCIUM 8.0 (L)  11/05/2020   PROT 6.5 11/04/2020   ALBUMIN 2.6 (L) 11/04/2020   AST 28 11/04/2020   ALT 24 11/04/2020   ALKPHOS 73 11/04/2020   BILITOT 0.9 11/04/2020   GFRNONAA >60 11/05/2020   GFRAA >60 11/29/2016    Lab Results  Component Value Date   WBC 16.5 (H) 11/05/2020   NEUTROABS 10.5 (H) 11/03/2020   HGB 11.4 (L) 11/05/2020   HCT 34.6 (L) 11/05/2020   MCV 95.8 11/05/2020   PLT 174 11/05/2020     STUDIES: DG Chest 2 View  Result Date: 10/27/2020 CLINICAL DATA:  Increasing dizziness, elevated white blood cell count, hypoxia EXAM: CHEST - 2 VIEW COMPARISON:  10/27/17 FINDINGS: Cardiac shadow is within normal  limits. Aortic calcifications are noted. Right lung is well aerated with some mild peripheral scarring stable from the prior study. New predominately left lower lobe infiltrative density is noted as well as what appears to be a large left perihilar mass lesion in the superior segment of the left lower lobe. No bony abnormality is noted. IMPRESSION: Changes consistent with a left perihilar mass lesion in the left lower lobe with associated post obstructive infiltrate. CT of the chest with contrast is recommended for further evaluation. Electronically Signed   By: Inez Catalina M.D.   On: 10/27/2020 18:50   CT HEAD WO CONTRAST (5MM)  Result Date: 10/27/2020 CLINICAL DATA:  Increasing dizziness over the past few weeks EXAM: CT HEAD WITHOUT CONTRAST TECHNIQUE: Contiguous axial images were obtained from the base of the skull through the vertex without intravenous contrast. COMPARISON:  11/30/2018 FINDINGS: Brain: No evidence of acute infarction, hemorrhage, hydrocephalus, extra-axial collection or mass lesion/mass effect. Mild atrophic and chronic white matter ischemic changes are noted. Vascular: No hyperdense vessel or unexpected calcification. Skull: Normal. Negative for fracture or focal lesion. Sinuses/Orbits: No acute finding. Other: None. IMPRESSION: Chronic atrophic and ischemic changes  without acute abnormality. Electronically Signed   By: Inez Catalina M.D.   On: 10/27/2020 18:53   CT Chest W Contrast  Result Date: 10/27/2020 CLINICAL DATA:  Dizziness for the past few weeks. Mass versus pneumonia on radiograph. EXAM: CT CHEST WITH CONTRAST TECHNIQUE: Multidetector CT imaging of the chest was performed during intravenous contrast administration. CONTRAST:  62mL OMNIPAQUE IOHEXOL 350 MG/ML SOLN COMPARISON:  Chest radiograph earlier today. Prior chest radiographs reviewed. Most recent CT 01/14/2012 FINDINGS: Cardiovascular: Aortic atherosclerosis and tortuosity. No aortic aneurysm. There are coronary artery calcifications. The heart is normal in size. No pericardial effusion. No evidence of central pulmonary embolus, exam not tailored to pulmonary artery evaluation. Mediastinum/Nodes: Enlarged subcarinal nodes which appear confluent. For example, nodal conglomerate measuring 3 cm AP, series 2, image 75. 10 mm lower anterior paratracheal node, series 2, image 58. Additional subcentimeter mediastinal lymph nodes, including a 10 mm left paratracheal node, series 2, image 50. Low left hilar node measures 10 mm, series 2, image 72. Left lower lobe masslike consolidation extends to the hilum. No right hilar adenopathy. No supraclavicular adenopathy. The esophagus is difficult to delineate from adjacent adenopathy in the mid lower aspect. There may be esophageal wall thickening. Lungs/Pleura: Rounded masslike consolidation in the left lower lobe measures 5.9 x 4.9 x 5.8 cm, series 3, image 86. There is some central low-density but no definite necrosis or internal air. There is surrounding ill-defined opacity and ground-glass extending to the pleural surface posteriorly and the fissure. Central extent towards the hilum in the left lower lobe bronchus. Patchy and ground-glass opacities distally in the left lower lobe, difficult to delineate airspace disease versus basilar honeycombing. There is moderate  emphysema. Breathing motion artifact limits detailed assessment. Subpleural reticulation is noted in the mid lower lung zone on the right. The previous left upper lobe pulmonary nodules appear to represent vascular structures on the current exam. The previous tiny right pulmonary nodules are not convincingly seen. Trace left pleural thickening in the area of consolidation. Upper Abdomen: Motion artifact limits assessment. No visualized adrenal nodule. Cholecystectomy. Probable splenule at the splenic hilum. Musculoskeletal: Partial fusion of left lateral fifth and sixth ribs. This was also seen on prior exam. Definite focal osseous lesion, allowing for motion artifact limitations IMPRESSION: 1. Rounded masslike consolidation in the left lower lobe measuring 5.9  x 4.9 x 5.8 cm. There is surrounding ill-defined opacity and ground-glass extending to the pleural surface posteriorly and the fissure. Differential considerations include primary bronchogenic malignancy or pneumonia, given mediastinal adenopathy, malignancy is favored. Enlarged subcarinal with borderline mediastinal and left hilar lymph nodes. Consider referral to multi disciplinary thoracic conference. 2. Moderately advanced emphysema. Subpleural reticulation suggests an element of underlying interstitial lung disease. 3. Previous left upper lobe pulmonary nodules appear to represent vascular structures on the current exam. The tiny right lung nodules on remote prior CT are not confidently seen, with current motion artifact limitations. 4. Aortic atherosclerosis and coronary artery calcifications. Aortic Atherosclerosis (ICD10-I70.0) and Emphysema (ICD10-J43.9). Electronically Signed   By: Keith Rake M.D.   On: 10/27/2020 19:42   MR BRAIN W WO CONTRAST  Result Date: 11/05/2020 CLINICAL DATA:  Non-small-cell lung cancer staging EXAM: MRI HEAD WITHOUT AND WITH CONTRAST TECHNIQUE: Multiplanar, multiecho pulse sequences of the brain and surrounding  structures were obtained without and with intravenous contrast. CONTRAST:  65mL GADAVIST GADOBUTROL 1 MMOL/ML IV SOLN COMPARISON:  CT head 10/27/2020 FINDINGS: Brain: No acute infarction, hemorrhage, hydrocephalus, extra-axial collection or mass lesion. Generalized atrophy. Mild to moderate white matter hyperintensity bilaterally. Hyperintensity in the central pons. Normal enhancement.  Negative for metastatic disease to the brain. Vascular: Normal arterial flow voids at the skull base Skull and upper cervical spine: Negative Sinuses/Orbits: Mild mucosal edema paranasal sinuses. Bilateral cataract extraction Other: None IMPRESSION: Negative for metastatic disease to brain Atrophy and chronic microvascular ischemic change in the white matter. Electronically Signed   By: Franchot Gallo M.D.   On: 11/05/2020 19:51   DG Chest Portable 1 View  Result Date: 11/03/2020 CLINICAL DATA:  Short of breath, COPD EXAM: PORTABLE CHEST 1 VIEW COMPARISON:  10/27/2020 FINDINGS: Single frontal view of the chest demonstrates stable masslike consolidation at the left hilum. There is increasing consolidation within the left lung, with underlying volume loss. Background emphysema is again noted. Developing consolidation at the right lung base may be hypoventilatory. No large effusion or pneumothorax. IMPRESSION: 1. Persistent masslike consolidation at the left hilum, concerning for malignancy based on prior CT 10/27/2020. 2. Increasing left lung consolidation, which could reflect postobstructive change. 3. Developing right basilar consolidation, favor atelectasis. 4. Stable background emphysema. Electronically Signed   By: Randa Ngo M.D.   On: 11/03/2020 18:15    ASSESSMENT: Left lower lobe lung mass highly suspicious for malignancy.  PLAN:    Left lower lobe lung mass highly suspicious for malignancy: CT scan results from November 05, 2020 reviewed independently and report as above with highly suspicious 6 cm left lower  lobe lung mass and mediastinal lymphadenopathy.  MRI of the brain on the same date was negative for disease.  Patient is being discharged today and will require PET scan and biopsy as an outpatient to complete staging work-up and confirm diagnosis.  We will arrange follow-up in the cancer center in 1 to 2 weeks to discuss his results and treatment planning. Shortness of breath: Continue oxygen as ordered. Leukocytosis: Likely reactive, monitor. Anemia: Mild, patient's most recent hemoglobin is 11.4.  Monitor.  Appreciate consult, call with questions.  Lloyd Huger, MD   11/07/2020 11:07 AM

## 2020-11-07 NOTE — Progress Notes (Signed)
Order to discharge pt home.  Discharge instructions/AVS given to patient and reviewed - education provided as needed.  Pt advised to call PCP and/or come back to the hospital if there are any problems. Pt verbalized understanding.    

## 2020-11-08 LAB — CULTURE, BLOOD (ROUTINE X 2)
Culture: NO GROWTH
Culture: NO GROWTH
Special Requests: ADEQUATE
Special Requests: ADEQUATE

## 2020-11-08 NOTE — Progress Notes (Signed)
Patient ID: Chris Lozano, male   DOB: November 17, 1940, 80 y.o.   MRN: [id number]  I E-prescribed prescription over to his pharmacy yesterday.  Not quite sure what happened with the E scribe.  Family states that the pharmacy did not have the prescriptions.  I called the pharmacy today and verbally gave prescriptions of Omnicef 300 mg twice a day for 2 days (4 pills).  Prednisone 20 mg 2 tablets twice a day for 2 days (4 pills).  Dr Loletha Grayer

## 2020-11-11 ENCOUNTER — Encounter: Payer: Self-pay | Admitting: *Deleted

## 2020-11-12 ENCOUNTER — Other Ambulatory Visit: Payer: Medicare Other

## 2020-11-18 LAB — BLOOD GAS, VENOUS
Acid-Base Excess: 9 mmol/L — ABNORMAL HIGH (ref 0.0–2.0)
Bicarbonate: 39.5 mmol/L — ABNORMAL HIGH (ref 20.0–28.0)
O2 Saturation: 71.9 %
Patient temperature: 37
pCO2, Ven: 88 mmHg (ref 44.0–60.0)
pH, Ven: 7.26 (ref 7.250–7.430)
pO2, Ven: 44 mmHg (ref 32.0–45.0)

## 2020-11-19 ENCOUNTER — Other Ambulatory Visit: Payer: Medicare Other

## 2020-11-19 ENCOUNTER — Ambulatory Visit
Admission: RE | Admit: 2020-11-19 | Discharge: 2020-11-19 | Disposition: A | Discharge status: Home / Self Care | Payer: Medicare Other | Source: Ambulatory Visit | Attending: Oncology | Admitting: Oncology

## 2020-11-19 ENCOUNTER — Other Ambulatory Visit: Payer: Self-pay

## 2020-11-19 DIAGNOSIS — R918 Other nonspecific abnormal finding of lung field: Secondary | ICD-10-CM | POA: Insufficient documentation

## 2020-11-19 LAB — GLUCOSE, CAPILLARY: Glucose-Capillary: 99 mg/dL (ref 70–99)

## 2020-11-19 MED ORDER — FLUDEOXYGLUCOSE F - 18 (FDG) INJECTION
8.8000 | Freq: Once | INTRAVENOUS | Status: AC
  Administered 2020-11-19: 9.46 via INTRAVENOUS

## 2020-11-24 ENCOUNTER — Encounter: Payer: Self-pay | Admitting: *Deleted

## 2020-11-24 NOTE — Progress Notes (Signed)
[facility]  [phone] [phone]  ID: HEARL HEIKES OB: [birth year]  MR#: [id number]  KDT#:[id number]  Patient Care Team: Adin Hector, MD as PCP - General (Internal Medicine) Telford Nab, RN as Oncology Nurse Navigator  CHIEF COMPLAINT: Mass of left lung, likely stage IIIc lung cancer.  INTERVAL HISTORY: Patient returns to clinic today for hospital follow-up, discussion of his PET scan results, and additional diagnostic planning.  He continues to have chronic shortness of breath, but this is at his baseline.  He otherwise feels well.  He has no neurologic complaints.  He denies any recent fevers.  He has a good appetite and denies weight loss.  He denies any pain.  He has no chest pain, cough, or hemoptysis.  He denies any nausea, vomiting, constipation, or diarrhea.  He has no urinary complaints.  Patient offers no further specific complaints today.  REVIEW OF SYSTEMS:   Review of Systems  Constitutional: Negative.  Negative for chills, fever and malaise/fatigue.  Respiratory:  Positive for shortness of breath. Negative for cough and hemoptysis.   Cardiovascular: Negative.  Negative for chest pain and leg swelling.  Gastrointestinal: Negative.  Negative for abdominal pain.  Genitourinary: Negative.  Negative for dysuria.  Musculoskeletal: Negative.  Negative for back pain.  Skin: Negative.  Negative for rash.  Neurological: Negative.  Negative for dizziness, focal weakness, weakness and headaches.  Psychiatric/Behavioral: Negative.  The patient is not nervous/anxious.    As per HPI. Otherwise, a complete review of systems is negative.  PAST MEDICAL HISTORY: Past Medical History:  Diagnosis Date   Asthma    BPH (benign prostatic hyperplasia)    CAD (coronary artery disease)    COPD (chronic obstructive pulmonary disease) (HCC)    GERD (gastroesophageal reflux disease)    HLD (hyperlipidemia)    Sleep apnea     PAST SURGICAL  HISTORY: Past Surgical History:  Procedure Laterality Date   ADENOIDECTOMY     CARDIAC CATHETERIZATION     GALLBLADDER SURGERY     LEFT HEART CATH AND CORONARY ANGIOGRAPHY Left 01/10/2019   Procedure: LEFT HEART CATH AND CORONARY ANGIOGRAPHY;  Surgeon: Teodoro Spray, MD;  Location: [facility];  Service: Cardiovascular;  Laterality: Left;   TONSILLECTOMY      FAMILY HISTORY: Family History  Problem Relation Age of Onset   Heart disease Other    Kidney disease Neg Hx    Prostate cancer Neg Hx    Kidney cancer Neg Hx    Bladder Cancer Neg Hx     ADVANCED DIRECTIVES (Y/N):  N  HEALTH MAINTENANCE: Social History   Tobacco Use   Smoking status: Former   Smokeless tobacco: Former    Types: Chew   Tobacco comments:    as a teenager  Scientific laboratory technician Use: Never used  Substance Use Topics   Alcohol use: Yes    Alcohol/week: 0.0 standard drinks    Comment: occasionally   Drug use: No     Colonoscopy:  PAP:  Bone density:  Lipid panel:  Allergies  Allergen Reactions   Fluocinolone Other (See Comments)    Reaction: dizzines, "drunk" Other reaction(s): Unknown   Ciprofloxacin Hcl     Dizzy     Current Outpatient Medications  Medication Sig Dispense Refill   albuterol (PROVENTIL HFA;VENTOLIN HFA) 108 (90 Base) MCG/ACT inhaler Inhale 2 puffs into the lungs every 6 (six) hours as needed for wheezing or shortness of breath.  albuterol (PROVENTIL) (2.5 MG/3ML) 0.083% nebulizer solution Take 2.5 mg by nebulization every 6 (six) hours as needed for wheezing or shortness of breath.     aspirin EC 81 MG tablet Take 81 mg by mouth daily.     atorvastatin (LIPITOR) 80 MG tablet Take 80 mg by mouth daily.     carvedilol (COREG) 3.125 MG tablet Take 3.125 mg by mouth 2 (two) times daily.     Cholecalciferol (VITAMIN D3 PO) Take 1 tablet by mouth daily.     formoterol (PERFOROMIST) 20 MCG/2ML nebulizer solution Inhale 20 mcg into the lungs 2 (two) times daily.       levothyroxine (SYNTHROID) 25 MCG tablet Take 25 mcg by mouth daily before breakfast.     Multiple Vitamins-Minerals (ZINC PO) Take 1 tablet by mouth daily.     predniSONE (DELTASONE) 20 MG tablet Two tabs po daily for two days 4 tablet 0   Tiotropium Bromide Monohydrate 2.5 MCG/ACT AERS Inhale 2 puffs into the lungs daily.     gabapentin (NEURONTIN) 100 MG capsule Take 100 mg by mouth daily.      No current facility-administered medications for this visit.    OBJECTIVE: Vitals:   11/25/20 1353  BP: 116/73  Pulse: 79  Resp: 18  Temp: (!) 97.5 F (36.4 C)  SpO2: 92%     Body mass index is 26 kg/m.    ECOG FS:1 - Symptomatic but completely ambulatory  General: Well-developed, well-nourished, no acute distress. Eyes: Pink conjunctiva, anicteric sclera. HEENT: Normocephalic, moist mucous membranes. Lungs: No audible wheezing or coughing. Heart: Regular rate and rhythm. Abdomen: Soft, nontender, no obvious distention. Musculoskeletal: No edema, cyanosis, or clubbing. Neuro: Alert, answering all questions appropriately. Cranial nerves grossly intact. Skin: No rashes or petechiae noted. Psych: Normal affect. Lymphatics: No cervical, calvicular, axillary or inguinal LAD.   LAB RESULTS:  Lab Results  Component Value Date   NA 138 11/05/2020   K 4.4 11/05/2020   CL 101 11/05/2020   CO2 34 (H) 11/05/2020   GLUCOSE 114 (H) 11/05/2020   BUN 21 11/05/2020   CREATININE 0.86 11/05/2020   CALCIUM 8.0 (L) 11/05/2020   PROT 6.5 11/04/2020   ALBUMIN 2.6 (L) 11/04/2020   AST 28 11/04/2020   ALT 24 11/04/2020   ALKPHOS 73 11/04/2020   BILITOT 0.9 11/04/2020   GFRNONAA >60 11/05/2020   GFRAA >60 11/29/2016    Lab Results  Component Value Date   WBC 16.5 (H) 11/05/2020   NEUTROABS 10.5 (H) 11/03/2020   HGB 11.4 (L) 11/05/2020   HCT 34.6 (L) 11/05/2020   MCV 95.8 11/05/2020   PLT 174 11/05/2020     STUDIES: DG Chest 2 View  Result Date: 10/27/2020 CLINICAL DATA:   Increasing dizziness, elevated white blood cell count, hypoxia EXAM: CHEST - 2 VIEW COMPARISON:  10/27/17 FINDINGS: Cardiac shadow is within normal limits. Aortic calcifications are noted. Right lung is well aerated with some mild peripheral scarring stable from the prior study. New predominately left lower lobe infiltrative density is noted as well as what appears to be a large left perihilar mass lesion in the superior segment of the left lower lobe. No bony abnormality is noted. IMPRESSION: Changes consistent with a left perihilar mass lesion in the left lower lobe with associated post obstructive infiltrate. CT of the chest with contrast is recommended for further evaluation. Electronically Signed   By: Inez Catalina M.D.   On: 10/27/2020 18:50   CT HEAD WO CONTRAST (5MM)  Result Date: 10/27/2020 CLINICAL DATA:  Increasing dizziness over the past few weeks EXAM: CT HEAD WITHOUT CONTRAST TECHNIQUE: Contiguous axial images were obtained from the base of the skull through the vertex without intravenous contrast. COMPARISON:  11/30/2018 FINDINGS: Brain: No evidence of acute infarction, hemorrhage, hydrocephalus, extra-axial collection or mass lesion/mass effect. Mild atrophic and chronic white matter ischemic changes are noted. Vascular: No hyperdense vessel or unexpected calcification. Skull: Normal. Negative for fracture or focal lesion. Sinuses/Orbits: No acute finding. Other: None. IMPRESSION: Chronic atrophic and ischemic changes without acute abnormality. Electronically Signed   By: Inez Catalina M.D.   On: 10/27/2020 18:53   CT Chest W Contrast  Result Date: 10/27/2020 CLINICAL DATA:  Dizziness for the past few weeks. Mass versus pneumonia on radiograph. EXAM: CT CHEST WITH CONTRAST TECHNIQUE: Multidetector CT imaging of the chest was performed during intravenous contrast administration. CONTRAST:  72mL OMNIPAQUE IOHEXOL 350 MG/ML SOLN COMPARISON:  Chest radiograph earlier today. Prior chest radiographs  reviewed. Most recent CT 01/14/2012 FINDINGS: Cardiovascular: Aortic atherosclerosis and tortuosity. No aortic aneurysm. There are coronary artery calcifications. The heart is normal in size. No pericardial effusion. No evidence of central pulmonary embolus, exam not tailored to pulmonary artery evaluation. Mediastinum/Nodes: Enlarged subcarinal nodes which appear confluent. For example, nodal conglomerate measuring 3 cm AP, series 2, image 75. 10 mm lower anterior paratracheal node, series 2, image 58. Additional subcentimeter mediastinal lymph nodes, including a 10 mm left paratracheal node, series 2, image 50. Low left hilar node measures 10 mm, series 2, image 72. Left lower lobe masslike consolidation extends to the hilum. No right hilar adenopathy. No supraclavicular adenopathy. The esophagus is difficult to delineate from adjacent adenopathy in the mid lower aspect. There may be esophageal wall thickening. Lungs/Pleura: Rounded masslike consolidation in the left lower lobe measures 5.9 x 4.9 x 5.8 cm, series 3, image 86. There is some central low-density but no definite necrosis or internal air. There is surrounding ill-defined opacity and ground-glass extending to the pleural surface posteriorly and the fissure. Central extent towards the hilum in the left lower lobe bronchus. Patchy and ground-glass opacities distally in the left lower lobe, difficult to delineate airspace disease versus basilar honeycombing. There is moderate emphysema. Breathing motion artifact limits detailed assessment. Subpleural reticulation is noted in the mid lower lung zone on the right. The previous left upper lobe pulmonary nodules appear to represent vascular structures on the current exam. The previous tiny right pulmonary nodules are not convincingly seen. Trace left pleural thickening in the area of consolidation. Upper Abdomen: Motion artifact limits assessment. No visualized adrenal nodule. Cholecystectomy. Probable  splenule at the splenic hilum. Musculoskeletal: Partial fusion of left lateral fifth and sixth ribs. This was also seen on prior exam. Definite focal osseous lesion, allowing for motion artifact limitations IMPRESSION: 1. Rounded masslike consolidation in the left lower lobe measuring 5.9 x 4.9 x 5.8 cm. There is surrounding ill-defined opacity and ground-glass extending to the pleural surface posteriorly and the fissure. Differential considerations include primary bronchogenic malignancy or pneumonia, given mediastinal adenopathy, malignancy is favored. Enlarged subcarinal with borderline mediastinal and left hilar lymph nodes. Consider referral to multi disciplinary thoracic conference. 2. Moderately advanced emphysema. Subpleural reticulation suggests an element of underlying interstitial lung disease. 3. Previous left upper lobe pulmonary nodules appear to represent vascular structures on the current exam. The tiny right lung nodules on remote prior CT are not confidently seen, with current motion artifact limitations. 4. Aortic atherosclerosis and coronary artery calcifications. Aortic  Atherosclerosis (ICD10-I70.0) and Emphysema (ICD10-J43.9). Electronically Signed   By: Keith Rake M.D.   On: 10/27/2020 19:42   MR BRAIN W WO CONTRAST  Result Date: 11/05/2020 CLINICAL DATA:  Non-small-cell lung cancer staging EXAM: MRI HEAD WITHOUT AND WITH CONTRAST TECHNIQUE: Multiplanar, multiecho pulse sequences of the brain and surrounding structures were obtained without and with intravenous contrast. CONTRAST:  70mL GADAVIST GADOBUTROL 1 MMOL/ML IV SOLN COMPARISON:  CT head 10/27/2020 FINDINGS: Brain: No acute infarction, hemorrhage, hydrocephalus, extra-axial collection or mass lesion. Generalized atrophy. Mild to moderate white matter hyperintensity bilaterally. Hyperintensity in the central pons. Normal enhancement.  Negative for metastatic disease to the brain. Vascular: Normal arterial flow voids at the skull  base Skull and upper cervical spine: Negative Sinuses/Orbits: Mild mucosal edema paranasal sinuses. Bilateral cataract extraction Other: None IMPRESSION: Negative for metastatic disease to brain Atrophy and chronic microvascular ischemic change in the white matter. Electronically Signed   By: Franchot Gallo M.D.   On: 11/05/2020 19:51   NM PET Image Initial (PI) Skull Base To Thigh  Result Date: 11/20/2020 CLINICAL DATA:  Initial treatment strategy for pulmonary nodule. EXAM: NUCLEAR MEDICINE PET SKULL BASE TO THIGH TECHNIQUE: 9.46 mCi F-18 FDG was injected intravenously. Full-ring PET imaging was performed from the skull base to thigh after the radiotracer. CT data was obtained and used for attenuation correction and anatomic localization. Fasting blood glucose: 99 mg/dl COMPARISON:  Chest CT dated October 27, 2020 FINDINGS: Mediastinal blood pool activity: SUV max 2.8 Liver activity: SUV max 3.2 NECK: No hypermetabolic lymph nodes in the neck. Incidental CT findings: none CHEST: Large hypermetabolic left lower lobe lung mass measuring approximately 6.4 x 4.5 cm, unchanged when remeasured in similar plane, with of SUV max of 18.6. Adjacent ground-glass opacities which possibly represent postobstructive changes versus lymphangitic carcinomatosis. Enlarged and hypermetabolic mediastinal, left supraclavicular lymph nodes, and bilateral cardiophrenic lymph nodes. Reference subcarinal lymph node measures 2.8 cm in short axis on image 100 with an SUV max of 13.5. Reference right lower paratracheal lymph node measures 1.7 cm in short axis on image 86 with an SUV max of 12.5. Reference left supraclavicular lymph node measures 9 mm in short axis on image 58 with an SUV max of 6.1. Right cardiophrenic lymph node measuring 7 mm in short axis on image 119 with an SUV max of 3.1. Incidental CT findings: Three-vessel coronary artery calcifications. Atherosclerotic disease of the thoracic aorta. Severe upper lobe  predominant centrilobular emphysema. Ground-glass opacity of the right middle lobe with mild FDG uptake, likely scarring related to prior infection. ABDOMEN/PELVIS: No abnormal hypermetabolic activity within the liver, pancreas, adrenal glands, or spleen. No hypermetabolic lymph nodes in the abdomen or pelvis. Incidental CT findings: Cholecystectomy clips diverticulosis of the descending and sigmoid colon. Atherosclerotic disease of the abdominal aorta. SKELETON: No focal hypermetabolic activity to suggest skeletal metastasis. Incidental CT findings: none IMPRESSION: Large hypermetabolic left lower lobe lung mass with adjacent hypermetabolic ground-glass opacities which possibly represent postobstructive change versus lymphangitic carcinomatosis. Enlarged and hypermetabolic mediastinal, left supraclavicular lymph nodes, and bilateral cardiophrenic lymph nodes, compatible with metastatic disease. No evidence of metastatic disease in the abdomen or pelvis. Aortic Atherosclerosis (ICD10-I70.0) and Emphysema (ICD10-J43.9). Electronically Signed   By: Yetta Glassman M.D.   On: 11/20/2020 16:26   DG Chest Portable 1 View  Result Date: 11/03/2020 CLINICAL DATA:  Short of breath, COPD EXAM: PORTABLE CHEST 1 VIEW COMPARISON:  10/27/2020 FINDINGS: Single frontal view of the chest demonstrates stable masslike consolidation at the  left hilum. There is increasing consolidation within the left lung, with underlying volume loss. Background emphysema is again noted. Developing consolidation at the right lung base may be hypoventilatory. No large effusion or pneumothorax. IMPRESSION: 1. Persistent masslike consolidation at the left hilum, concerning for malignancy based on prior CT 10/27/2020. 2. Increasing left lung consolidation, which could reflect postobstructive change. 3. Developing right basilar consolidation, favor atelectasis. 4. Stable background emphysema. Electronically Signed   By: Randa Ngo M.D.   On:  11/03/2020 18:15    ASSESSMENT: Mass of left lung, likely stage IIIc lung cancer.  PLAN:    Mass of left lung: PET scan results from November 19, 2020 reviewed independently and reported as above with large 6.4 cm hypermetabolic left lower lobe mass consistent with malignancy.  Patient also noted to have FDG avid mediastinal, left supraclavicular, and bilateral costophrenic lymph nodes.  MRI of the brain on October 28, 2020 did not reveal any metastatic disease.  Patient will require bronchoscopy/EBUS with biopsy to confirm the diagnosis.  Return to clinic 1 week after his procedure to discuss his final pathology results and treatment planning. Oxygen requirement: Patient reports he currently is on 3 L of oxygen which is approximately his baseline.  I spent a total of 30 minutes reviewing chart data, face-to-face evaluation with the patient, counseling and coordination of care as detailed above.   Patient expressed understanding and was in agreement with this plan. He also understands that He can call clinic at any time with any questions, concerns, or complaints.   Cancer Staging No matching staging information was found for the patient.  Lloyd Huger, MD   11/25/2020 6:51 PM

## 2020-11-25 ENCOUNTER — Encounter: Payer: Self-pay | Admitting: Oncology

## 2020-11-25 ENCOUNTER — Other Ambulatory Visit: Payer: Self-pay

## 2020-11-25 ENCOUNTER — Encounter: Payer: Self-pay | Admitting: *Deleted

## 2020-11-25 ENCOUNTER — Inpatient Hospital Stay: Payer: Medicare Other | Attending: Oncology | Admitting: Oncology

## 2020-11-25 VITALS — BP 116/73 | HR 79 | Temp 97.5°F | Resp 18 | Wt 161.1 lb

## 2020-11-25 DIAGNOSIS — A419 Sepsis, unspecified organism: Secondary | ICD-10-CM | POA: Diagnosis not present

## 2020-11-25 DIAGNOSIS — Z7982 Long term (current) use of aspirin: Secondary | ICD-10-CM | POA: Insufficient documentation

## 2020-11-25 DIAGNOSIS — J9600 Acute respiratory failure, unspecified whether with hypoxia or hypercapnia: Secondary | ICD-10-CM | POA: Diagnosis not present

## 2020-11-25 DIAGNOSIS — N400 Enlarged prostate without lower urinary tract symptoms: Secondary | ICD-10-CM | POA: Insufficient documentation

## 2020-11-25 DIAGNOSIS — R918 Other nonspecific abnormal finding of lung field: Secondary | ICD-10-CM | POA: Insufficient documentation

## 2020-11-25 DIAGNOSIS — Z79899 Other long term (current) drug therapy: Secondary | ICD-10-CM | POA: Insufficient documentation

## 2020-11-25 DIAGNOSIS — Z7952 Long term (current) use of systemic steroids: Secondary | ICD-10-CM | POA: Insufficient documentation

## 2020-11-25 DIAGNOSIS — Z87891 Personal history of nicotine dependence: Secondary | ICD-10-CM | POA: Insufficient documentation

## 2020-11-25 NOTE — Progress Notes (Signed)
Met with patient and his wife during initial consult with Dr. Grayland Ormond to review PET scan results. All questions answered during visit. Reviewed with pt and his family that he will follow up with Dr. Grayland Ormond about 1 week after bronchoscopy. Awaiting for pt to be scheduled for bronchoscopy at this time. Will follow up with Dr. Lanney Gins regarding bronchoscopy. Contact info given to pt and his family. Instructed to call with any questions. Pt verbalized understanding.

## 2020-11-25 NOTE — Progress Notes (Signed)
Pt reports recent weight loss of approx 8-9 lbs in 2 weeks d/t poor appetite. No other concerns at this time. Pt sats 92% on 2L but states that is within normal range for him

## 2020-11-27 ENCOUNTER — Other Ambulatory Visit: Admission: RE | Admit: 2020-11-27 | Payer: Medicare Other | Source: Ambulatory Visit

## 2020-11-27 ENCOUNTER — Inpatient Hospital Stay
Admission: EM | Admit: 2020-11-27 | Discharge: 2020-12-03 | DRG: 871 | Disposition: A | Discharge status: Home / Self Care | Payer: Medicare Other | Attending: Internal Medicine | Admitting: Internal Medicine

## 2020-11-27 ENCOUNTER — Emergency Department: Payer: Medicare Other

## 2020-11-27 ENCOUNTER — Encounter: Payer: Self-pay | Admitting: Radiology

## 2020-11-27 ENCOUNTER — Other Ambulatory Visit: Payer: Self-pay

## 2020-11-27 DIAGNOSIS — E875 Hyperkalemia: Secondary | ICD-10-CM | POA: Diagnosis present

## 2020-11-27 DIAGNOSIS — Z7952 Long term (current) use of systemic steroids: Secondary | ICD-10-CM

## 2020-11-27 DIAGNOSIS — G4733 Obstructive sleep apnea (adult) (pediatric): Secondary | ICD-10-CM | POA: Diagnosis present

## 2020-11-27 DIAGNOSIS — J9600 Acute respiratory failure, unspecified whether with hypoxia or hypercapnia: Secondary | ICD-10-CM | POA: Diagnosis present

## 2020-11-27 DIAGNOSIS — Z20822 Contact with and (suspected) exposure to covid-19: Secondary | ICD-10-CM | POA: Diagnosis present

## 2020-11-27 DIAGNOSIS — Z7982 Long term (current) use of aspirin: Secondary | ICD-10-CM | POA: Diagnosis not present

## 2020-11-27 DIAGNOSIS — R918 Other nonspecific abnormal finding of lung field: Secondary | ICD-10-CM

## 2020-11-27 DIAGNOSIS — J9602 Acute respiratory failure with hypercapnia: Secondary | ICD-10-CM | POA: Diagnosis not present

## 2020-11-27 DIAGNOSIS — I2510 Atherosclerotic heart disease of native coronary artery without angina pectoris: Secondary | ICD-10-CM | POA: Diagnosis present

## 2020-11-27 DIAGNOSIS — J441 Chronic obstructive pulmonary disease with (acute) exacerbation: Secondary | ICD-10-CM

## 2020-11-27 DIAGNOSIS — K219 Gastro-esophageal reflux disease without esophagitis: Secondary | ICD-10-CM | POA: Diagnosis present

## 2020-11-27 DIAGNOSIS — Z79890 Hormone replacement therapy (postmenopausal): Secondary | ICD-10-CM | POA: Diagnosis not present

## 2020-11-27 DIAGNOSIS — J189 Pneumonia, unspecified organism: Secondary | ICD-10-CM | POA: Diagnosis present

## 2020-11-27 DIAGNOSIS — Z66 Do not resuscitate: Secondary | ICD-10-CM | POA: Diagnosis present

## 2020-11-27 DIAGNOSIS — C3492 Malignant neoplasm of unspecified part of left bronchus or lung: Secondary | ICD-10-CM

## 2020-11-27 DIAGNOSIS — Z79899 Other long term (current) drug therapy: Secondary | ICD-10-CM

## 2020-11-27 DIAGNOSIS — J9621 Acute and chronic respiratory failure with hypoxia: Secondary | ICD-10-CM | POA: Diagnosis present

## 2020-11-27 DIAGNOSIS — C3432 Malignant neoplasm of lower lobe, left bronchus or lung: Secondary | ICD-10-CM | POA: Diagnosis present

## 2020-11-27 DIAGNOSIS — A419 Sepsis, unspecified organism: Secondary | ICD-10-CM | POA: Diagnosis present

## 2020-11-27 DIAGNOSIS — R0603 Acute respiratory distress: Secondary | ICD-10-CM

## 2020-11-27 DIAGNOSIS — E785 Hyperlipidemia, unspecified: Secondary | ICD-10-CM | POA: Diagnosis present

## 2020-11-27 DIAGNOSIS — J9601 Acute respiratory failure with hypoxia: Secondary | ICD-10-CM

## 2020-11-27 DIAGNOSIS — Z888 Allergy status to other drugs, medicaments and biological substances status: Secondary | ICD-10-CM

## 2020-11-27 DIAGNOSIS — Z9889 Other specified postprocedural states: Secondary | ICD-10-CM

## 2020-11-27 DIAGNOSIS — E039 Hypothyroidism, unspecified: Secondary | ICD-10-CM | POA: Diagnosis present

## 2020-11-27 DIAGNOSIS — J9622 Acute and chronic respiratory failure with hypercapnia: Secondary | ICD-10-CM | POA: Diagnosis present

## 2020-11-27 DIAGNOSIS — I10 Essential (primary) hypertension: Secondary | ICD-10-CM | POA: Diagnosis present

## 2020-11-27 DIAGNOSIS — J439 Emphysema, unspecified: Secondary | ICD-10-CM | POA: Diagnosis present

## 2020-11-27 DIAGNOSIS — I700 Atherosclerosis of aorta: Secondary | ICD-10-CM | POA: Diagnosis present

## 2020-11-27 DIAGNOSIS — Z87891 Personal history of nicotine dependence: Secondary | ICD-10-CM | POA: Diagnosis not present

## 2020-11-27 DIAGNOSIS — R509 Fever, unspecified: Secondary | ICD-10-CM

## 2020-11-27 LAB — CBC WITH DIFFERENTIAL/PLATELET
Abs Immature Granulocytes: 0.18 10*3/uL — ABNORMAL HIGH (ref 0.00–0.07)
Basophils Absolute: 0 10*3/uL (ref 0.0–0.1)
Basophils Relative: 0 %
Eosinophils Absolute: 0 10*3/uL (ref 0.0–0.5)
Eosinophils Relative: 0 %
HCT: 42.5 % (ref 39.0–52.0)
Hemoglobin: 13.5 g/dL (ref 13.0–17.0)
Immature Granulocytes: 2 %
Lymphocytes Relative: 18 %
Lymphs Abs: 2.2 10*3/uL (ref 0.7–4.0)
MCH: 30.2 pg (ref 26.0–34.0)
MCHC: 31.8 g/dL (ref 30.0–36.0)
MCV: 95.1 fL (ref 80.0–100.0)
Monocytes Absolute: 0.8 10*3/uL (ref 0.1–1.0)
Monocytes Relative: 7 %
Neutro Abs: 8.8 10*3/uL — ABNORMAL HIGH (ref 1.7–7.7)
Neutrophils Relative %: 73 %
Platelets: 334 10*3/uL (ref 150–400)
RBC: 4.47 MIL/uL (ref 4.22–5.81)
RDW: 14.6 % (ref 11.5–15.5)
WBC: 12 10*3/uL — ABNORMAL HIGH (ref 4.0–10.5)
nRBC: 0 % (ref 0.0–0.2)

## 2020-11-27 LAB — COMPREHENSIVE METABOLIC PANEL
ALT: 29 U/L (ref 0–44)
AST: 32 U/L (ref 15–41)
Albumin: 3.1 g/dL — ABNORMAL LOW (ref 3.5–5.0)
Alkaline Phosphatase: 90 U/L (ref 38–126)
Anion gap: 7 (ref 5–15)
BUN: 20 mg/dL (ref 8–23)
CO2: 33 mmol/L — ABNORMAL HIGH (ref 22–32)
Calcium: 8.4 mg/dL — ABNORMAL LOW (ref 8.9–10.3)
Chloride: 98 mmol/L (ref 98–111)
Creatinine, Ser: 1 mg/dL (ref 0.61–1.24)
GFR, Estimated: >60 mL/min (ref >60)
Glucose, Bld: 161 mg/dL — ABNORMAL HIGH (ref 70–99)
Potassium: 5 mmol/L (ref 3.5–5.1)
Sodium: 138 mmol/L (ref 135–145)
Total Bilirubin: 0.8 mg/dL (ref 0.3–1.2)
Total Protein: 6.9 g/dL (ref 6.5–8.1)

## 2020-11-27 LAB — BLOOD GAS, VENOUS
Acid-Base Excess: 10.4 mmol/L — ABNORMAL HIGH (ref 0.0–2.0)
Bicarbonate: 39.4 mmol/L — ABNORMAL HIGH (ref 20.0–28.0)
Delivery systems: POSITIVE
FIO2: 0.35
O2 Saturation: 78.8 %
PEEP: 8 cmH2O
Patient temperature: 37
Pressure support: 14 cmH2O
pCO2, Ven: 73 mmHg (ref 44.0–60.0)
pH, Ven: 7.34 (ref 7.250–7.430)
pO2, Ven: 46 mmHg — ABNORMAL HIGH (ref 32.0–45.0)

## 2020-11-27 LAB — RESP PANEL BY RT-PCR (FLU A&B, COVID) ARPGX2
Influenza A by PCR: NEGATIVE
Influenza B by PCR: NEGATIVE
SARS Coronavirus 2 by RT PCR: NEGATIVE

## 2020-11-27 LAB — LACTIC ACID, PLASMA: Lactic Acid, Venous: 1.2 mmol/L (ref 0.5–1.9)

## 2020-11-27 MED ORDER — ACETAMINOPHEN 325 MG RE SUPP
975.0000 mg | Freq: Once | RECTAL | Status: AC
  Administered 2020-11-27: 975 mg via RECTAL
  Filled 2020-11-27: qty 3

## 2020-11-27 MED ORDER — VANCOMYCIN HCL IN DEXTROSE 1-5 GM/200ML-% IV SOLN
1000.0000 mg | Freq: Once | INTRAVENOUS | Status: AC
  Administered 2020-11-27: 1000 mg via INTRAVENOUS

## 2020-11-27 MED ORDER — IOHEXOL 350 MG/ML SOLN
75.0000 mL | Freq: Once | INTRAVENOUS | Status: AC | PRN
  Administered 2020-11-28: 75 mL via INTRAVENOUS

## 2020-11-27 MED ORDER — SODIUM CHLORIDE 0.9 % IV SOLN
2.0000 g | Freq: Once | INTRAVENOUS | Status: AC
  Administered 2020-11-27: 2 g via INTRAVENOUS

## 2020-11-27 NOTE — ED Notes (Signed)
Per Hassan Rowan, NP

## 2020-11-27 NOTE — ED Notes (Signed)
RT notified pt's O2 satsitting at 88%, states will come to bedside and up O2 concentration.

## 2020-11-27 NOTE — ED Provider Notes (Signed)
[facility] Emergency Department Provider Note  ____________________________________________   I have reviewed the triage vital signs and the nursing notes.   HISTORY  Chief Complaint Shortness of breath.   History limited by and level 5 caveat due to: Respiratory difficulty HPI Chris Lozano is a 80 y.o. male who presents to the emergency department today via EMS because of concern for respiratory distress. Patient had admission to the hospital roughly 1 month ago for acute on chronic respiratory distress. Found to have pneumonia. Also found to have lung mass for which he has been seeing pulmonology and oncology. The patient started having worse shortness of breath today. The patient was found to be febrile and hypoxic by EMS. Was started on CPAP.   Records reviewed. Per medical record review patient has a history of COPD, CAD.   Past Medical History:  Diagnosis Date   Asthma    BPH (benign prostatic hyperplasia)    CAD (coronary artery disease)    COPD (chronic obstructive pulmonary disease) (HCC)    GERD (gastroesophageal reflux disease)    HLD (hyperlipidemia)    Sleep apnea     Patient Active Problem List   Diagnosis Date Noted   Pneumonia of left lower lobe due to infectious organism    Unsteady gait    Essential hypertension    Respiratory failure ([place]) 11/04/2020   Acute on chronic respiratory failure ([place]) 11/03/2020   Mass of left lung 10/29/2020   Dizziness 10/27/2020   Acute respiratory failure with hypoxia and hypercapnia ([place]) 11/28/2016   COPD exacerbation ([place]) 11/28/2016   CAD (coronary artery disease) 11/28/2016   HLD (hyperlipidemia) 11/28/2016   Obstructive sleep apnea 11/28/2016   GERD (gastroesophageal reflux disease) 11/28/2016   Acute on chronic respiratory failure with hypoxia ([place]) 11/28/2016    Past Surgical History:  Procedure Laterality Date   ADENOIDECTOMY     CARDIAC CATHETERIZATION     GALLBLADDER SURGERY      LEFT HEART CATH AND CORONARY ANGIOGRAPHY Left 01/10/2019   Procedure: LEFT HEART CATH AND CORONARY ANGIOGRAPHY;  Surgeon: Teodoro Spray, MD;  Location: [facility];  Service: Cardiovascular;  Laterality: Left;   TONSILLECTOMY      Prior to Admission medications   Medication Sig Start Date End Date Taking? Authorizing Provider  albuterol (PROVENTIL HFA;VENTOLIN HFA) 108 (90 Base) MCG/ACT inhaler Inhale 2 puffs into the lungs every 6 (six) hours as needed for wheezing or shortness of breath.  12/12/13   [provider]  Ascorbic Acid (VITAMIN C) 500 MG CHEW Chew 500 mg by mouth in the morning.    [provider]  aspirin EC 81 MG tablet Take 81 mg by mouth daily.    [provider]  atorvastatin (LIPITOR) 80 MG tablet Take 80 mg by mouth daily.    [provider]  carvedilol (COREG) 3.125 MG tablet Take 3.125 mg by mouth 2 (two) times daily. 05/05/20   [provider]  Cholecalciferol (VITAMIN D3 PO) Take 1 tablet by mouth daily.    [provider]  formoterol (PERFOROMIST) 20 MCG/2ML nebulizer solution Inhale 20 mcg into the lungs 2 (two) times daily.  11/16/17   [provider]  furosemide (LASIX) 20 MG tablet Take 20 mg by mouth daily as needed. Patient not taking: No sig reported 11/13/20   [provider]  gabapentin (NEURONTIN) 100 MG capsule Take 100 mg by mouth daily as needed (back pain.). 12/21/17 11/26/21  [provider]  ipratropium (ATROVENT) 0.02 % nebulizer solution Take 2.5 mLs by nebulization 4 (four) times daily. 11/22/20   [provider]  levothyroxine (SYNTHROID) 25 MCG tablet Take 25 mcg by mouth daily before breakfast. 12/15/18   [provider]  Multiple Vitamin (MULTIVITAMIN WITH MINERALS) TABS tablet Take 1 tablet by mouth in the morning. MEN'S ONE A DAY    [provider]  potassium chloride (KLOR-CON) 10 MEQ tablet Take 10 mEq by mouth daily as  needed. Patient not taking: Reported on 11/26/2020 11/13/20   [provider]  predniSONE (DELTASONE) 10 MG tablet Take 10 mg by mouth in the morning.    [provider]  predniSONE (DELTASONE) 20 MG tablet Two tabs po daily for two days Patient not taking: No sig reported 11/07/20   Loletha Grayer, MD  sulfamethoxazole-trimethoprim (BACTRIM) 400-80 MG tablet Take 1 tablet by mouth every Monday, Wednesday, and Friday. 11/21/20   [provider]    Allergies Fluocinolone and Ciprofloxacin hcl  Family History  Problem Relation Age of Onset   Heart disease Other    Kidney disease Neg Hx    Prostate cancer Neg Hx    Kidney cancer Neg Hx    Bladder Cancer Neg Hx     Social History Social History   Tobacco Use   Smoking status: Former   Smokeless tobacco: Former    Types: Chew   Tobacco comments:    as a teenager  Scientific laboratory technician Use: Never used  Substance Use Topics   Alcohol use: Yes    Alcohol/week: 0.0 standard drinks    Comment: occasionally   Drug use: No    Review of Systems Constitutional: Positive for fever. Eyes: No visual changes. ENT: No sore throat. Cardiovascular: Denies chest pain. Respiratory: Positive for shortness of breath. Gastrointestinal: No abdominal pain.  No nausea, no vomiting.  No diarrhea.   Genitourinary: Negative for dysuria. Musculoskeletal: Negative for back pain. Skin: Negative for rash. Neurological: Negative for headaches, focal weakness or numbness.  ____________________________________________   PHYSICAL EXAM:  VITAL SIGNS: ED Triage Vitals  Enc Vitals Group     BP 11/27/20 1939 117/75     Pulse Rate 11/27/20 1939 97     Resp 11/27/20 1939 19     Temp 11/27/20 1949 (!) 101.5 F (38.6 C)     Temp Source 11/27/20 1949 Oral     SpO2 11/27/20 1937 (!) 74 %    Constitutional: Alert and oriented.  Eyes: Conjunctivae are normal.  ENT      Head: Normocephalic and atraumatic.      Nose: No  congestion/rhinnorhea.      Mouth/Throat: Mucous membranes are moist.      Neck: No stridor. Hematological/Lymphatic/Immunilogical: No cervical lymphadenopathy. Cardiovascular: Normal rate, regular rhythm.  No murmurs, rubs, or gallops.  Respiratory: Increased respiratory distress. Diffuse rhonchi.  Gastrointestinal: Soft and non tender. No rebound. No guarding.  Genitourinary: Deferred Musculoskeletal: Normal range of motion in all extremities. No lower extremity edema. Neurologic:  Normal speech and language. No gross focal neurologic deficits are appreciated.  Skin:  Skin is warm, dry and intact. No rash noted. Psychiatric: Mood and affect are normal. Speech and behavior are normal. Patient exhibits appropriate insight and judgment.  ____________________________________________    LABS (pertinent positives/negatives)  Lactic acid 1.2 VBG CO2 73 CMP wnl except co2 33, glu 161, ca 8.4, alb 3.1 CBC wbc 12.0, hgb 13.5, plt 334  ____________________________________________   EKG  I, Nance Pear,  attending physician, personally viewed and interpreted this EKG  EKG Time: 1940 Rate: 97 Rhythm: ectopic atrial rhythm Axis: left axis deviation Intervals: qtc 398 QRS: IVCD ST changes: no st elevation Impression: abnormal ekg  ____________________________________________    RADIOLOGY  CXR Stable left lung mass. No acute airspace opacities.  ____________________________________________   PROCEDURES  Procedures  CRITICAL CARE Performed by: Nance Pear   Total critical care time: 30 minutes  Critical care time was exclusive of separately billable procedures and treating other patients.  Critical care was necessary to treat or prevent imminent or life-threatening deterioration.  Critical care was time spent personally by me on the following activities: development of treatment plan with patient and/or surrogate as well as nursing, discussions with  consultants, evaluation of patient's response to treatment, examination of patient, obtaining history from patient or surrogate, ordering and performing treatments and interventions, ordering and review of laboratory studies, ordering and review of radiographic studies, pulse oximetry and re-evaluation of patient's condition.  ____________________________________________   INITIAL IMPRESSION / ASSESSMENT AND PLAN / ED COURSE  Pertinent labs & imaging results that were available during my care of the patient were reviewed by me and considered in my medical decision making (see chart for details).   Patient presented to the emergency department today with shortness of breath.  Patient was in significant respiratory distress.  Patient had been put on CPAP by EMS and given solumedrol and duoneb and was transferred to BiPAP here.  Patient was also found to be febrile.  Patient did have a recent hospitalization for pneumonia and acute on chronic COPD exacerbation respiratory failure.  Given fever and concern for possible recurrent pneumonia patient was given antibiotics to help treat possible healthcare associated pneumonia.  Chest x-ray again shows findings concerning for a possible lung mass.  Patient is already being worked up for this as an outpatient.  Given potential for possible cancer, low-grade fever and hypoxia did have some concern for possible PE as well.  Did order CT angio.  Patient will certainly need to be admitted to the hospital service.  ____________________________________________   FINAL CLINICAL IMPRESSION(S) / ED DIAGNOSES  Final diagnoses:  Respiratory distress  Fever, unspecified fever cause     Note: This dictation was prepared with Dragon dictation. Any transcriptional errors that result from this process are unintentional     Nance Pear, MD 11/27/20 2355

## 2020-11-27 NOTE — ED Triage Notes (Signed)
Pt. To ED from home via EMS c/o increasing SOB/work of breathing since lunchtime today. Per medic, pt. Was 74% on RA, temp was 102.7. Pt. Was on bipap in ED 3 weeks ago for pneumonia.

## 2020-11-28 ENCOUNTER — Inpatient Hospital Stay: Admission: RE | Admit: 2020-11-28 | Payer: Medicare Other | Source: Ambulatory Visit

## 2020-11-28 DIAGNOSIS — R918 Other nonspecific abnormal finding of lung field: Secondary | ICD-10-CM | POA: Diagnosis not present

## 2020-11-28 DIAGNOSIS — J189 Pneumonia, unspecified organism: Secondary | ICD-10-CM | POA: Diagnosis not present

## 2020-11-28 DIAGNOSIS — J9601 Acute respiratory failure with hypoxia: Secondary | ICD-10-CM | POA: Diagnosis not present

## 2020-11-28 DIAGNOSIS — J9602 Acute respiratory failure with hypercapnia: Secondary | ICD-10-CM | POA: Diagnosis not present

## 2020-11-28 LAB — BLOOD GAS, ARTERIAL
Acid-Base Excess: 6.1 mmol/L — ABNORMAL HIGH (ref 0.0–2.0)
Bicarbonate: 35.1 mmol/L — ABNORMAL HIGH (ref 20.0–28.0)
Delivery systems: POSITIVE
Expiratory PAP: 8
FIO2: 50
Inspiratory PAP: 14
O2 Saturation: 98.3 %
Patient temperature: 37
RATE: 12 resp/min
pCO2 arterial: 73 mmHg (ref 32.0–48.0)
pH, Arterial: 7.29 — ABNORMAL LOW (ref 7.350–7.450)
pO2, Arterial: 121 mmHg — ABNORMAL HIGH (ref 83.0–108.0)

## 2020-11-28 LAB — BLOOD CULTURE ID PANEL (REFLEXED) - BCID2

## 2020-11-28 LAB — CBC
HCT: 41.3 % (ref 39.0–52.0)
Hemoglobin: 12.9 g/dL — ABNORMAL LOW (ref 13.0–17.0)
MCH: 29.9 pg (ref 26.0–34.0)
MCHC: 31.2 g/dL (ref 30.0–36.0)
MCV: 95.6 fL (ref 80.0–100.0)
Platelets: 236 10*3/uL (ref 150–400)
RBC: 4.32 MIL/uL (ref 4.22–5.81)
RDW: 14.5 % (ref 11.5–15.5)
WBC: 8.2 10*3/uL (ref 4.0–10.5)
nRBC: 0 % (ref 0.0–0.2)

## 2020-11-28 LAB — BASIC METABOLIC PANEL
Anion gap: 4 — ABNORMAL LOW (ref 5–15)
BUN: 21 mg/dL (ref 8–23)
CO2: 33 mmol/L — ABNORMAL HIGH (ref 22–32)
Calcium: 8.2 mg/dL — ABNORMAL LOW (ref 8.9–10.3)
Chloride: 102 mmol/L (ref 98–111)
Creatinine, Ser: 0.79 mg/dL (ref 0.61–1.24)
GFR, Estimated: >60 mL/min (ref >60)
Glucose, Bld: 168 mg/dL — ABNORMAL HIGH (ref 70–99)
Potassium: 5.5 mmol/L — ABNORMAL HIGH (ref 3.5–5.1)
Sodium: 139 mmol/L (ref 135–145)

## 2020-11-28 LAB — POTASSIUM: Potassium: 4.1 mmol/L (ref 3.5–5.1)

## 2020-11-28 LAB — LACTIC ACID, PLASMA: Lactic Acid, Venous: 1.3 mmol/L (ref 0.5–1.9)

## 2020-11-28 LAB — MRSA NEXT GEN BY PCR, NASAL: MRSA by PCR Next Gen: NOT DETECTED

## 2020-11-28 MED ORDER — ONDANSETRON HCL 4 MG/2ML IJ SOLN: 4.0000 mg | Freq: Four times a day (QID) | INTRAMUSCULAR | Status: DC | PRN

## 2020-11-28 MED ORDER — ATORVASTATIN CALCIUM 80 MG PO TABS
80.0000 mg | ORAL_TABLET | Freq: Every day | ORAL | Status: DC
  Administered 2020-11-28 – 2020-12-03 (×6): 80 mg via ORAL
  Filled 2020-11-28 (×4): qty 1
  Filled 2020-11-28: qty 4
  Filled 2020-11-28: qty 1

## 2020-11-28 MED ORDER — ASCORBIC ACID 500 MG PO TABS
500.0000 mg | ORAL_TABLET | Freq: Every morning | ORAL | Status: DC
  Administered 2020-11-28 – 2020-12-03 (×6): 500 mg via ORAL
  Filled 2020-11-28 (×7): qty 1

## 2020-11-28 MED ORDER — CARVEDILOL 3.125 MG PO TABS
3.1250 mg | ORAL_TABLET | Freq: Two times a day (BID) | ORAL | Status: DC
  Administered 2020-11-29 – 2020-12-03 (×9): 3.125 mg via ORAL
  Filled 2020-11-28 (×10): qty 1

## 2020-11-28 MED ORDER — TRAZODONE HCL 50 MG PO TABS: 25.0000 mg | ORAL_TABLET | Freq: Every evening | ORAL | Status: DC | PRN

## 2020-11-28 MED ORDER — ALBUTEROL SULFATE (2.5 MG/3ML) 0.083% IN NEBU: 2.5000 mg | INHALATION_SOLUTION | Freq: Four times a day (QID) | RESPIRATORY_TRACT | Status: DC | PRN

## 2020-11-28 MED ORDER — GABAPENTIN 100 MG PO CAPS: 100.0000 mg | ORAL_CAPSULE | Freq: Every day | ORAL | Status: DC | PRN

## 2020-11-28 MED ORDER — ADULT MULTIVITAMIN W/MINERALS CH
1.0000 | ORAL_TABLET | Freq: Every morning | ORAL | Status: DC
  Administered 2020-11-28 – 2020-12-03 (×6): 1 via ORAL
  Filled 2020-11-28 (×6): qty 1

## 2020-11-28 MED ORDER — ONDANSETRON HCL 4 MG PO TABS: 4.0000 mg | ORAL_TABLET | Freq: Four times a day (QID) | ORAL | Status: DC | PRN

## 2020-11-28 MED ORDER — ENOXAPARIN SODIUM 40 MG/0.4ML IJ SOSY
40.0000 mg | PREFILLED_SYRINGE | INTRAMUSCULAR | Status: DC
  Administered 2020-11-28 – 2020-11-29 (×2): 40 mg via SUBCUTANEOUS
  Filled 2020-11-28 (×3): qty 0.4

## 2020-11-28 MED ORDER — IPRATROPIUM-ALBUTEROL 0.5-2.5 (3) MG/3ML IN SOLN
3.0000 mL | Freq: Four times a day (QID) | RESPIRATORY_TRACT | Status: DC
  Administered 2020-11-28 – 2020-11-29 (×4): 3 mL via RESPIRATORY_TRACT
  Filled 2020-11-28 (×4): qty 3

## 2020-11-28 MED ORDER — ACETAMINOPHEN 325 MG PO TABS: 650.0000 mg | ORAL_TABLET | Freq: Four times a day (QID) | ORAL | Status: DC | PRN

## 2020-11-28 MED ORDER — METHYLPREDNISOLONE SODIUM SUCC 40 MG IJ SOLR
40.0000 mg | Freq: Two times a day (BID) | INTRAMUSCULAR | Status: AC
  Administered 2020-11-28 (×2): 40 mg via INTRAVENOUS
  Filled 2020-11-28 (×2): qty 1

## 2020-11-28 MED ORDER — SODIUM CHLORIDE 0.9 % IV SOLN
3.0000 g | Freq: Three times a day (TID) | INTRAVENOUS | Status: DC
  Administered 2020-11-28 – 2020-12-03 (×14): 3 g via INTRAVENOUS
  Filled 2020-11-28: qty 3
  Filled 2020-11-28 (×4): qty 8
  Filled 2020-11-28: qty 3
  Filled 2020-11-28 (×9): qty 8
  Filled 2020-11-28: qty 3
  Filled 2020-11-28: qty 8

## 2020-11-28 MED ORDER — ACETAMINOPHEN 650 MG RE SUPP
650.0000 mg | Freq: Four times a day (QID) | RECTAL | Status: DC | PRN
  Filled 2020-11-28: qty 1

## 2020-11-28 MED ORDER — MAGNESIUM HYDROXIDE 400 MG/5ML PO SUSP: 30.0000 mL | Freq: Every day | ORAL | Status: DC | PRN

## 2020-11-28 MED ORDER — SODIUM ZIRCONIUM CYCLOSILICATE 10 G PO PACK
10.0000 g | PACK | Freq: Once | ORAL | Status: DC
  Filled 2020-11-28: qty 1

## 2020-11-28 MED ORDER — FUROSEMIDE 10 MG/ML IJ SOLN
40.0000 mg | Freq: Once | INTRAMUSCULAR | Status: AC
  Administered 2020-11-28: 40 mg via INTRAVENOUS
  Filled 2020-11-28: qty 4

## 2020-11-28 MED ORDER — GUAIFENESIN ER 600 MG PO TB12
600.0000 mg | ORAL_TABLET | Freq: Two times a day (BID) | ORAL | Status: DC
  Administered 2020-11-28 – 2020-12-03 (×10): 600 mg via ORAL
  Filled 2020-11-28 (×11): qty 1

## 2020-11-28 MED ORDER — ASPIRIN EC 81 MG PO TBEC
81.0000 mg | DELAYED_RELEASE_TABLET | Freq: Every day | ORAL | Status: DC
  Administered 2020-11-28: 81 mg via ORAL
  Filled 2020-11-28 (×2): qty 1

## 2020-11-28 MED ORDER — ALBUTEROL SULFATE HFA 108 (90 BASE) MCG/ACT IN AERS: 2.0000 | INHALATION_SPRAY | Freq: Four times a day (QID) | RESPIRATORY_TRACT | Status: DC | PRN

## 2020-11-28 MED ORDER — LEVOTHYROXINE SODIUM 25 MCG PO TABS
25.0000 ug | ORAL_TABLET | Freq: Every day | ORAL | Status: DC
  Administered 2020-11-28 – 2020-12-03 (×6): 25 ug via ORAL
  Filled 2020-11-28 (×7): qty 1

## 2020-11-28 MED ORDER — PREDNISONE 20 MG PO TABS
40.0000 mg | ORAL_TABLET | Freq: Every day | ORAL | Status: AC
  Administered 2020-11-29 – 2020-12-02 (×4): 40 mg via ORAL
  Filled 2020-11-28 (×5): qty 2

## 2020-11-28 MED ORDER — HYDROCOD POLST-CPM POLST ER 10-8 MG/5ML PO SUER: 5.0000 mL | Freq: Two times a day (BID) | ORAL | Status: DC | PRN

## 2020-11-28 MED ORDER — VITAMIN D3 25 MCG (1000 UNIT) PO TABS
1000.0000 [IU] | ORAL_TABLET | Freq: Every day | ORAL | Status: DC
  Administered 2020-11-28 – 2020-12-03 (×6): 1000 [IU] via ORAL
  Filled 2020-11-28 (×12): qty 1

## 2020-11-28 MED ORDER — SODIUM CHLORIDE 0.9 % IV SOLN: INTRAVENOUS | Status: DC

## 2020-11-28 NOTE — Progress Notes (Signed)
PROGRESS NOTE    [name]  GQB:[id number] DOB: August 02, 1940 DOA: 11/27/2020 PCP: Adin Hector, MD   Assessment & Plan:   Active Problems:   Acute respiratory failure (HCC)  Acute hypoxic and hypercarbic respiratory failure: secondary to COPD exacerbation superimposed on bacterial postobstructive pneumonia secondary to left lower lung mass.  Continue on IV unasyn, steroids, bronchodilators and supplemental oxygen. Encourage incentive spirometry. Has plans to f/u w/ oncology at [facility]   Lung mass: was supposed get outpatient lung biopsy 10/24 at [facility] w/ Dr. Lanney Gins. Will consult onco  Post obstructive pneumonia: likely secondary to lung mass. Continue on IV unasyn, bronchodilators, & supplemental oxygen   Sepsis: met criteria w/ tachypnea, tachycardia and likely postobstructive pneumonia. Management as stated above   Hyperkalemia: lasix x1 & lokelma x 1 ordered. Repeat K level ordered  Unlikely bacteremia: blood cxs growing stap epi. Likely containment. Will repeat blood cxs tomorrow   HTN:  continue on home dose of coreg   Hypothyroidism: continue on home dose of synthroid   HLD: continue on stain   Hx of CAD: continue on home dose of coreg, statin& aspirin    DVT prophylaxis: lovenox  Code Status: full  Family Communication: discussed pt's care w/ pt's daughter, Chris Lozano, and answered her questions  Disposition Plan: depends on PT/OT recs (not consulted yet)   Level of care: Progressive Cardiac  Status is: Inpatient  Remains inpatient appropriate because: severity of illness     Consultants:  Onco   Procedures:   Antimicrobials:    Subjective: Pt c/o malaise   Objective: Vitals:   11/28/20 0215 11/28/20 0230 11/28/20 0245 11/28/20 0602  BP: _0 109/65  Pulse: (!) 52 (!) 52 (!) 58 79  Resp: _1 Temp:      TempSrc:      SpO2: 99% 99% 97% 98%    Intake/Output Summary (Last 24 hours) at 11/28/2020 8280 Last data filed at  11/27/2020 2116 Gross per 24 hour  Intake 592.9 ml  Output --  Net 592.9 ml   There were no vitals filed for this visit.  Examination:  General exam: Appears calm and comfortable. Hard of hearing  Respiratory system: course breath sounds b/l  Cardiovascular system: S1 & S2 +. No  rubs, gallops or clicks.  Gastrointestinal system: Abdomen is nondistended, soft and nontender. Normal bowel sounds heard. Central nervous system: Alert and oriented. Moves all extremities  Psychiatry: Judgement and insight appear normal. Flat mood and affect    Data Reviewed: I have personally reviewed following labs and imaging studies  CBC: Recent Labs  Lab 11/27/20 1944 11/28/20 0616  WBC 12.0* 8.2  NEUTROABS 8.8*  --   HGB 13.5 12.9*  HCT 42.5 41.3  MCV 95.1 95.6  PLT 334 034   Basic Metabolic Panel: Recent Labs  Lab 11/27/20 1944 11/28/20 0616  NA 138 139  K 5.0 5.5*  CL 98 102  CO2 33* 33*  GLUCOSE 161* 168*  BUN 20 21  CREATININE 1.00 0.79  CALCIUM 8.4* 8.2*   GFR: Estimated Creatinine Clearance: 65.4 mL/min (by C-G formula based on SCr of 0.79 mg/dL). Liver Function Tests: Recent Labs  Lab 11/27/20 1944  AST 32  ALT 29  ALKPHOS 90  BILITOT 0.8  PROT 6.9  ALBUMIN 3.1*   No results for input(s): LIPASE, AMYLASE in the last 168 hours. No results for input(s): AMMONIA in the last 168 hours. Coagulation Profile: No results for input(s): INR,  PROTIME in the last 168 hours. Cardiac Enzymes: No results for input(s): CKTOTAL, CKMB, CKMBINDEX, TROPONINI in the last 168 hours. BNP (last 3 results) No results for input(s): PROBNP in the last 8760 hours. HbA1C: No results for input(s): HGBA1C in the last 72 hours. CBG: No results for input(s): GLUCAP in the last 168 hours. Lipid Profile: No results for input(s): CHOL, HDL, LDLCALC, TRIG, CHOLHDL, LDLDIRECT in the last 72 hours. Thyroid Function Tests: No results for input(s): TSH, T4TOTAL, FREET4, T3FREE, THYROIDAB in  the last 72 hours. Anemia Panel: No results for input(s): VITAMINB12, FOLATE, FERRITIN, TIBC, IRON, RETICCTPCT in the last 72 hours. Sepsis Labs: Recent Labs  Lab 11/27/20 1946 11/28/20 0616  LATICACIDVEN 1.2 1.3    Recent Results (from the past 240 hour(s))  Blood Culture (routine x 2)     Status: None (Preliminary result)   Collection Time: 11/27/20  7:44 PM   Specimen: BLOOD  Result Value Ref Range Status   Specimen Description BLOOD BLOOD LEFT FOREARM  Final   Special Requests   Final    BOTTLES DRAWN AEROBIC AND ANAEROBIC Blood Culture adequate volume   Culture   Final    NO GROWTH < 12 HOURS Performed at [facility] [address] [place], [place] [address]    Report Status PENDING  Incomplete  Blood Culture (routine x 2)     Status: None (Preliminary result)   Collection Time: 11/27/20  7:44 PM   Specimen: BLOOD  Result Value Ref Range Status   Specimen Description BLOOD RIGHT ANTECUBITAL  Final   Special Requests   Final    BOTTLES DRAWN AEROBIC AND ANAEROBIC Blood Culture adequate volume   Culture   Final    NO GROWTH < 12 HOURS Performed at [facility], [address] [place], [place] [address]    Report Status PENDING  Incomplete  Resp Panel by RT-PCR (Flu A&B, Covid) Nasopharyngeal Swab     Status: None   Collection Time: 11/27/20  7:44 PM   Specimen: Nasopharyngeal Swab; Nasopharyngeal(NP) swabs in vial transport medium  Result Value Ref Range Status   SARS Coronavirus 2 by RT PCR NEGATIVE NEGATIVE Final    Comment: (NOTE) SARS-CoV-2 target nucleic acids are NOT DETECTED.  The SARS-CoV-2 RNA is generally detectable in upper respiratory specimens during the acute phase of infection. The lowest concentration of SARS-CoV-2 viral copies this assay can detect is 138 copies/mL. A negative result does not preclude SARS-Cov-2 infection and should not be used as the sole basis for treatment or other patient management decisions. A negative  result may occur with  improper specimen collection/handling, submission of specimen other than nasopharyngeal swab, presence of viral mutation(s) within the areas targeted by this assay, and inadequate number of viral copies(<138 copies/mL). A negative result must be combined with clinical observations, patient history, and epidemiological information. The expected result is Negative.  Fact Sheet for Patients:  EntrepreneurPulse.com.au  Fact Sheet for Healthcare Providers:  IncredibleEmployment.be  This test is no t yet approved or cleared by the [place] FDA and  has been authorized for detection and/or diagnosis of SARS-CoV-2 by FDA under an Emergency Use Authorization (EUA). This EUA will remain  in effect (meaning this test can be used) for the duration of the COVID-19 declaration under Section 564(b)(1) of the Act, 21 U.S.C.section 360bbb-3(b)(1), unless the authorization is terminated  or revoked sooner.       Influenza A by PCR NEGATIVE NEGATIVE Final   Influenza B  by PCR NEGATIVE NEGATIVE Final    Comment: (NOTE) The Xpert Xpress SARS-CoV-2/FLU/RSV plus assay is intended as an aid in the diagnosis of influenza from Nasopharyngeal swab specimens and should not be used as a sole basis for treatment. Nasal washings and aspirates are unacceptable for Xpert Xpress SARS-CoV-2/FLU/RSV testing.  Fact Sheet for Patients: EntrepreneurPulse.com.au  Fact Sheet for Healthcare Providers: IncredibleEmployment.be  This test is not yet approved or cleared by the [place] FDA and has been authorized for detection and/or diagnosis of SARS-CoV-2 by FDA under an Emergency Use Authorization (EUA). This EUA will remain in effect (meaning this test can be used) for the duration of the COVID-19 declaration under Section 564(b)(1) of the Act, 21 U.S.C. section 360bbb-3(b)(1), unless the authorization is  terminated or revoked.  Performed at [facility], [address], [place], [place] [address]          Radiology Studies: CT Angio Chest PE W and/or Wo Contrast  Result Date: 11/28/2020 CLINICAL DATA:  Increased shortness of breath, hypoxia, fever EXAM: CT ANGIOGRAPHY CHEST WITH CONTRAST TECHNIQUE: Multidetector CT imaging of the chest was performed using the standard protocol during bolus administration of intravenous contrast. Multiplanar CT image reconstructions and MIPs were obtained to evaluate the vascular anatomy. CONTRAST:  38mL OMNIPAQUE IOHEXOL 350 MG/ML SOLN COMPARISON:  PET-CT dated 11/19/2020 FINDINGS: Cardiovascular: Satisfactory opacification the bilateral pulmonary arteries to the segmental level. No evidence of pulmonary embolism. Although not tailored for evaluation of the thoracic aorta, there is no evidence of thoracic aortic aneurysm or dissection. Atherosclerotic calcifications of the aortic arch. Heart is normal in size.  No pericardial effusion. Coronary atherosclerosis of the LAD and right coronary artery. Mediastinum/Nodes: 10 mm short axis left supraclavicular node (series 4/image 10), unchanged. Mediastinal lymphadenopathy, including a dominant 2.5 cm short axis subcarinal node (series 4/image 55), similar. Visualized thyroid is unremarkable. Lungs/Pleura: 4.5 x 6.7 cm left lower lobe mass (series 6/image 56), compatible with primary bronchogenic neoplasm when correlating with recent PET. Mild subpleural patchy opacities in the left lower lobe (series 6/image 33), unchanged, possibly post obstructive. New patchy opacities in the central right middle lobe (series 6/image 32), suspicious for pneumonia. Small right pleural effusion. Associated right lower lobe atelectasis. Moderate centrilobular and paraseptal emphysematous changes, upper lung predominant. No pneumothorax. Upper Abdomen: Visualized upper abdomen is notable for prior cholecystectomy and vascular  calcifications. Musculoskeletal: Mild degenerative changes of the visualized thoracolumbar spine. Review of the MIP images confirms the above findings. IMPRESSION: No evidence of pulmonary embolism. 6.7 cm left lower lobe mass, compatible with primary bronchogenic neoplasm when correlating with recent CT. Associated thoracic nodal metastases. Mild subpleural patchy opacities in the left lower lobe, unchanged, possibly postobstructive. New patchy opacities in the central right middle lobe, suspicious for pneumonia. Aortic Atherosclerosis (ICD10-I70.0) and Emphysema (ICD10-J43.9). Electronically Signed   By: Julian Hy M.D.   On: 11/28/2020 00:28   DG Chest Port 1 View  Result Date: 11/27/2020 CLINICAL DATA:  Sepsis, increasing shortness of breath, left lung mass EXAM: PORTABLE CHEST 1 VIEW COMPARISON:  11/03/2020, 11/19/2020 FINDINGS: 2 frontal views of the chest demonstrate a stable cardiac silhouette. The masslike left lower lobe consolidation seen on prior PET scan is again identified unchanged, concerning for malignancy based on previous PET scan findings. The adjacent interstitial and ground-glass opacities throughout the left lung could reflect lymphangitic spread of disease. Stable background scarring elsewhere throughout the lungs. No new consolidation, effusion, or pneumothorax. No acute bony abnormality. IMPRESSION: 1. Stable masslike  left lower lobe consolidation concerning for malignancy based on previous PET scan findings. Interstitial opacities throughout the left lower lobe could reflect lymphangitic spread of disease. 2. Otherwise stable background scarring without new airspace disease or effusion. Electronically Signed   By: Randa Ngo M.D.   On: 11/27/2020 20:04        Scheduled Meds:  ascorbic acid  500 mg Oral q AM   aspirin EC  81 mg Oral Daily   atorvastatin  80 mg Oral Daily   carvedilol  3.125 mg Oral BID   cholecalciferol  1,000 Units Oral Daily   enoxaparin  (LOVENOX) injection  40 mg Subcutaneous Q24H   furosemide  40 mg Intravenous Once   guaiFENesin  600 mg Oral BID   ipratropium-albuterol  3 mL Nebulization QID   levothyroxine  25 mcg Oral QAC breakfast   methylPREDNISolone (SOLU-MEDROL) injection  40 mg Intravenous Q12H   Followed by   Derrill Memo ON 11/29/2020] predniSONE  40 mg Oral Q breakfast   multivitamin with minerals  1 tablet Oral q AM   sodium zirconium cyclosilicate  10 g Oral Once   Continuous Infusions:  sodium chloride 100 mL/hr at 11/28/20 0143     LOS: 1 day    Time spent: 33 mins     Wyvonnia Dusky, MD [facility] Pager [phone]  If 7PM-7AM, please contact night-coverage 11/28/2020, 8:29 AM

## 2020-11-28 NOTE — Progress Notes (Signed)
Pt. Was taken off BIPAP and placed on a 3L nasal cannula. He stated he is breathing much better. Will continue to monitor.

## 2020-11-28 NOTE — ED Notes (Signed)
Pt taken off bipap by Respiratory therapist, Amy, placed on 3L per [place], tolerating well at this time. Repositioned in bed. Pt appreciative, bed locked and low, call light in reach, daughter and wife at bedside.

## 2020-11-28 NOTE — ED Notes (Signed)
This RN to bedside to assist pt. With use of urinal at bedside. Pt. Urinated and repositioned in bed for comfort.cont. cardiac monitoring maintained. Pt and daughter updated on POC.

## 2020-11-28 NOTE — ED Notes (Signed)
This RN to bedside, previous shift nurse at bedside. Pt. On 4L [place], settled back into bed after getting up to bathroom with stand by assist. Pt. Is conversational, denies pain or need. NAD. Pt. Verbalizes to this RN he will call with any needs, SOB, CP dizziness or other symptoms.

## 2020-11-28 NOTE — H&P (Addendum)
[place]   PATIENT NAME: Chris Lozano    MR#:  [id number]  DATE OF BIRTH:  March 03, 1940  DATE OF ADMISSION:  11/27/2020  PRIMARY CARE PHYSICIAN: Adin Hector, MD   Patient is coming from: Home  REQUESTING/REFERRING PHYSICIAN: Nance Pear, MD  CHIEF COMPLAINT:   Chief Complaint  Patient presents with  . Shortness of Breath    Pt. To [facility] from home via EMS c/o increasing SOB/work of breathing since lunchtime today. Per medic, pt. Was 74% on RA, temp was 102.7. Pt. Was on bipap in [facility] 3 weeks ago for pneumonia.    HISTORY OF PRESENT ILLNESS:  Chris Lozano is a 80 y.o. Caucasian male with medical history significant for COPD, coronary artery disease, GERD, hyperlipidemia, obstructive sleep apnea and asthma with recent diagnosis of lung mass who is awaiting work-up at [facility] for possible lung cancer, who presented to the ER with acute onset of worsening dyspnea that started this afternoon per his wife with hypoxemia to mid 23s by EMS.  He was in significant respiratory distress he was placed on CPAP by EMS and switched to BiPAP in the ER.  His wife gave all the history stated that he was having wheezing without significant cough and that he was having shaking chills and feeling warm.  No reported chest pain or palpitations.  She denied any nausea or vomiting or abdominal pain.  No headache or dizziness or blurred vision, paresthesias or focal muscle weakness.  No dysuria, oliguria, urinary frequency or urgency or flank pain.  He had similar presentation for which she was admitted here on 9/26 and discharged on 9/30 after being managed for pneumonia, COPD exacerbation and acute respiratory failure.  Per his wife he has been doing well since his discharge until until this p.m.  [facility] Course: When he came to the ER, temperature was one 1.5 and respiratory rate was 23 with a pulse oximetry of 90 to 94% on BiPAP.  Labs revealed VBG with pH 7.34 and HCO3 of 39.4 and CMP was  remarkable for a CO2 of 33 and blood glucose was 161 with albumin of 3.1 and total protein of 6.9.  Lactic acid was 1.2 and CBC showed leukocytosis of 12.  Influenza antigens and COVID-19 PCR came back negative.  Blood cultures were drawn. EKG as reviewed by me : EKG showed sinus rhythm with a rate of 97 with IVCD and likely atypical right bundle branch block as well as LVH with T wave inversion anteroseptally and laterally. Imaging: Portable chest ray showed 1. Stable masslike left lower lobe consolidation concerning for malignancy based on previous PET scan findings. Interstitial opacities throughout the left lower lobe could reflect lymphangitic spread of disease. 2. Otherwise stable background scarring without new airspace disease or effusion. Chest CTA was ordered and came back with the following: No evidence of pulmonary embolism.   6.7 cm left lower lobe mass, compatible with primary bronchogenic neoplasm when correlating with recent CT. Associated thoracic nodal metastases.   Mild subpleural patchy opacities in the left lower lobe, unchanged, possibly postobstructive.   New patchy opacities in the central right middle lobe, suspicious for pneumonia.   Aortic Atherosclerosis and emphysema.  The patient was given IV vancomycin and cefepime as well as 975 mg of rectal Tylenol.  He will be admitted to progressive care unit bed for further evaluation and management. PAST MEDICAL HISTORY:   Past Medical History:  Diagnosis Date  . Asthma   .  BPH (benign prostatic hyperplasia)   . CAD (coronary artery disease)   . COPD (chronic obstructive pulmonary disease) ([place])   . GERD (gastroesophageal reflux disease)   . HLD (hyperlipidemia)   . Sleep apnea     PAST SURGICAL HISTORY:   Past Surgical History:  Procedure Laterality Date  . ADENOIDECTOMY    . CARDIAC CATHETERIZATION    . GALLBLADDER SURGERY    . LEFT HEART CATH AND CORONARY ANGIOGRAPHY Left 01/10/2019   Procedure:  LEFT HEART CATH AND CORONARY ANGIOGRAPHY;  Surgeon: Teodoro Spray, MD;  Location: [place] [facility];  Service: Cardiovascular;  Laterality: Left;  . TONSILLECTOMY      SOCIAL HISTORY:   Social History   Tobacco Use  . Smoking status: Former  . Smokeless tobacco: Former    Types: Chew  . Tobacco comments:    as a teenager  Substance Use Topics  . Alcohol use: Yes    Alcohol/week: 0.0 standard drinks    Comment: occasionally    FAMILY HISTORY:   Family History  Problem Relation Age of Onset  . Heart disease Other   . Kidney disease Neg Hx   . Prostate cancer Neg Hx   . Kidney cancer Neg Hx   . Bladder Cancer Neg Hx     DRUG ALLERGIES:   Allergies  Allergen Reactions  . Fluocinolone Other (See Comments)    Reaction: dizzines, "drunk" Other reaction(s): Unknown  . Ciprofloxacin Hcl     Dizzy     REVIEW OF SYSTEMS:   ROS As per history of present illness. All pertinent systems were reviewed above. Constitutional, HEENT, cardiovascular, respiratory, GI, GU, musculoskeletal, neuro, psychiatric, endocrine, integumentary and hematologic systems were reviewed and are otherwise negative/unremarkable except for positive findings mentioned above in the HPI.   MEDICATIONS AT HOME:   Prior to Admission medications   Medication Sig Start Date End Date Taking? Authorizing Provider  albuterol (PROVENTIL HFA;VENTOLIN HFA) 108 (90 Base) MCG/ACT inhaler Inhale 2 puffs into the lungs every 6 (six) hours as needed for wheezing or shortness of breath.  12/12/13   [provider]  Ascorbic Acid (VITAMIN C) 500 MG CHEW Chew 500 mg by mouth in the morning.    [provider]  aspirin EC 81 MG tablet Take 81 mg by mouth daily.    [provider]  atorvastatin (LIPITOR) 80 MG tablet Take 80 mg by mouth daily.    [provider]  carvedilol (COREG) 3.125 MG tablet Take 3.125 mg by mouth 2 (two) times daily. 05/05/20   [provider]   Cholecalciferol (VITAMIN D3 PO) Take 1 tablet by mouth daily.    [provider]  formoterol (PERFOROMIST) 20 MCG/2ML nebulizer solution Inhale 20 mcg into the lungs 2 (two) times daily.  11/16/17   [provider]  furosemide (LASIX) 20 MG tablet Take 20 mg by mouth daily as needed. Patient not taking: No sig reported 11/13/20   [provider]  gabapentin (NEURONTIN) 100 MG capsule Take 100 mg by mouth daily as needed (back pain.). 12/21/17 11/26/21  [provider]  ipratropium (ATROVENT) 0.02 % nebulizer solution Take 2.5 mLs by nebulization 4 (four) times daily. 11/22/20   [provider]  levothyroxine (SYNTHROID) 25 MCG tablet Take 25 mcg by mouth daily before breakfast. 12/15/18   [provider]  Multiple Vitamin (MULTIVITAMIN WITH MINERALS) TABS tablet Take 1 tablet by mouth in the morning. MEN'S ONE A DAY    [provider]  potassium chloride (KLOR-CON) 10 MEQ tablet Take 10 mEq by mouth daily as needed. Patient not taking: Reported on 11/26/2020 11/13/20   [provider]  predniSONE (DELTASONE) 10 MG tablet Take 10 mg by mouth in the morning.    [provider]  predniSONE (DELTASONE) 20 MG tablet Two tabs po daily for two days Patient not taking: No sig reported 11/07/20   Loletha Grayer, MD  sulfamethoxazole-trimethoprim (BACTRIM) 400-80 MG tablet Take 1 tablet by mouth every Monday, Wednesday, and Friday. 11/21/20   [provider]      VITAL SIGNS:  Blood pressure 111/70, pulse 67, temperature (!) 101.5 F (38.6 C), temperature source Oral, resp. rate (!) 21, SpO2 97 %.  PHYSICAL EXAMINATION:  Physical Exam  GENERAL: Acutely ill 80 y.o.-year-old Caucasian male patient lying in the bed with moderate respiratory distress on BiPAP.  The patient was fairly somnolent and would briefly open his eyes and close them. EYES: Pupils equal, round, reactive to light and accommodation. No scleral  icterus. Extraocular muscles intact.  HEENT: Head atraumatic, normocephalic. Oropharynx and nasopharynx clear.  NECK:  Supple, no jugular venous distention. No thyroid enlargement, no tenderness.  LUNGS: Diminished expiratory airflow with residual expiratory wheezes and diffuse rhonchi with diminished left basal breath sounds with crackles and harsh vesicular breathing. CARDIOVASCULAR: Regular rate and rhythm, S1, S2 normal. No murmurs, rubs, or gallops.  ABDOMEN: Soft, nondistended, nontender. Bowel sounds present. No organomegaly or mass.  EXTREMITIES: No pedal edema, cyanosis, or clubbing.  NEUROLOGIC: Cranial nerves II through XII are intact. Muscle strength 5/5 in all extremities. Sensation intact. Gait not checked.  PSYCHIATRIC: The patient is alert and oriented x 3.  Normal affect and good eye contact. SKIN: No obvious rash, lesion, or ulcer.   LABORATORY PANEL:   CBC Recent Labs  Lab 11/27/20 1944  WBC 12.0*  HGB 13.5  HCT 42.5  PLT 334   ------------------------------------------------------------------------------------------------------------------  Chemistries  Recent Labs  Lab 11/27/20 1944  NA 138  K 5.0  CL 98  CO2 33*  GLUCOSE 161*  BUN 20  CREATININE 1.00  CALCIUM 8.4*  AST 32  ALT 29  ALKPHOS 90  BILITOT 0.8   ------------------------------------------------------------------------------------------------------------------  Cardiac Enzymes No results for input(s): TROPONINI in the last 168 hours. ------------------------------------------------------------------------------------------------------------------  RADIOLOGY:  DG Chest Port 1 View  Result Date: 11/27/2020 CLINICAL DATA:  Sepsis, increasing shortness of breath, left lung mass EXAM: PORTABLE CHEST 1 VIEW COMPARISON:  11/03/2020, 11/19/2020 FINDINGS: 2 frontal views of the chest demonstrate a stable cardiac silhouette. The masslike left lower lobe consolidation seen on prior PET scan is  again identified unchanged, concerning for malignancy based on previous PET scan findings. The adjacent interstitial and ground-glass opacities throughout the left lung could reflect lymphangitic spread of disease. Stable background scarring elsewhere throughout the lungs. No new consolidation, effusion, or pneumothorax. No acute bony abnormality. IMPRESSION: 1. Stable masslike left lower lobe consolidation concerning for malignancy based on previous PET scan findings. Interstitial opacities throughout the left lower lobe could reflect lymphangitic spread of disease. 2. Otherwise stable background scarring without new airspace disease or effusion. Electronically Signed   By: Randa Ngo M.D.   On: 11/27/2020 20:04      IMPRESSION AND PLAN:  Active Problems:   Acute respiratory failure ([place])  1.  Acute hypoxic and  hypercarbic respiratory failure secondary to COPD exacerbation likely secondary to bacterial postobstructive pneumonia with underlying left lower lobe lung mass.  The patient has subsequent sepsis  due to his pneumonia, as manifested by tachypnea, tachycardia and his WBC was borderline at 12. - The patient will be admitted to a PCU bed. - We will continue antibiotic therapy with IV Rocephin and Zithromax. - The patient has been doing well since his discharge 3 weeks ago per his wife and therefore I doubt healthcare associated pneumonia. - We will place him on mucolytic's as well as nebulizer therapy on a as needed and scheduled basis. - We will continue steroid therapy with IV Solu-Medrol for his COPD exacerbation. - We will continue him on BiPAP. - We will follow ABGs and adjust settings as appropriate.  His latest ABG showed a pH 7.29 with PCO2 of 73 and PO2 121 with HCO3 of 35.1 and O2 sat 90.3% and therefore will increase his rate for persistent respiratory acidosis. - We will follow blood and sputum cultures as well as pneumonia antigens. - We will hold off lung acting beta  agonist. - His wife understands that his prognosis is guarded. - I will hold off on pulmonary and oncology consults at this time as he had a plan for follow-up at [facility].  2.  Essential hypertension. - We will continue his Coreg.  3.  Hypothyroidism. - We will continue Synthroid.  4.  Dyslipidemia. - We will continue statin therapy.  5.  Coronary artery disease. - We will continue his aspirin, beta-blocker and statin therapy.   DVT prophylaxis: Lovenox. Code Status: The patient is DNR/DNI.  This was confirmed with his wife.   Family Communication:  The plan of care was discussed in details with the patient 's wife. I answered all questions.  She agreed to proceed with the above mentioned plan. Further management will depend upon hospital course. Disposition Plan: Back to previous home environment Consults called: none. All the records are reviewed and case discussed with [facility] provider.  Status is: Inpatient  Remains inpatient appropriate because:Ongoing diagnostic testing needed not appropriate for outpatient work up, Unsafe d/c plan, IV treatments appropriate due to intensity of illness use of BiPAP, and Inpatient level of care appropriate due to severity of illness   Dispo: The patient is from: Home              Anticipated d/c is to: Home              Patient currently is not medically stable to d/c.              Difficult to place patient: No  Authorized and performed by: Eugenie Norrie, MD Total critical care time: Approximately  60     minutes. Due to a high probability of clinically significant, life-threatening deterioration, the patient required my highest level of preparedness to intervene emergently and I personally spent this critical care time directly and personally managing the patient.  This critical care time included obtaining a history, examining the patient, pulse oximetry, ordering and review of studies, arranging urgent treatment with development of  management plan, evaluation of patient's response to treatment, frequent reassessment, and discussions with other providers. This critical care time was performed to assess and manage the high probability of imminent, life-threatening deterioration that could result in multiorgan failure.  It was exclusive of separately billable procedures and treating other patients and teaching time.      Christel Mormon M.D on 11/28/2020 at 12:19 AM  [facility]   From 7 PM-7 AM, contact night-coverage [email or web address]  CC: Primary care physician; Adin Hector, MD

## 2020-11-28 NOTE — ED Notes (Signed)
Pt walked to bathroom with this RN assist and portable oxygen tolerated well. Urine obtained. Placed in gown and back in bed, this RN placed clean sheets on bed, pt hooked to monitor, bed locked and low, call light in reach.

## 2020-11-28 NOTE — ED Notes (Signed)
Pt. Sitting up in bed, in good spirits, and conversational. Lunch tray set up. Daughter at bedside

## 2020-11-28 NOTE — ED Notes (Signed)
Pt resting in bed with no complaints or appearance of distress at this time. Call light in reach, bed locked and low, will continue to monitor.

## 2020-11-28 NOTE — ED Notes (Signed)
Pt. Up to bedside with this RN as stand by assist. 466mL urine out.

## 2020-11-28 NOTE — Progress Notes (Signed)
PHARMACY - PHYSICIAN COMMUNICATION CRITICAL VALUE ALERT - BLOOD CULTURE IDENTIFICATION (BCID)  Chris Lozano is an 80 y.o. male who presented to [facility] on 11/27/2020 with a chief complaint of SOB  Assessment:  Blood cultures from 10/20 with 1/4 GPC, BCID is MRSE  Name of physician (or Provider) Contacted: Dr Jimmye Norman  Current antibiotics: none (received x 1 vancomycin and cefepime 10/20).  Plan to start ampicillin/sulbactam for aspiration pneumonia  Changes to prescribed antibiotics recommended:  Recommendations accepted by provider - likely contaminant, monitor off vancomycin therapy  Results for orders placed or performed during the hospital encounter of 11/27/20  Blood Culture ID Panel (Reflexed) (Collected: 11/27/2020  7:44 PM)  Result Value Ref Range   Enterococcus faecalis NOT DETECTED NOT DETECTED   Enterococcus Faecium NOT DETECTED NOT DETECTED   Listeria monocytogenes NOT DETECTED NOT DETECTED   Staphylococcus species DETECTED (A) NOT DETECTED   Staphylococcus aureus (BCID) NOT DETECTED NOT DETECTED   Staphylococcus epidermidis DETECTED (A) NOT DETECTED   Staphylococcus lugdunensis NOT DETECTED NOT DETECTED   Streptococcus species NOT DETECTED NOT DETECTED   Streptococcus agalactiae NOT DETECTED NOT DETECTED   Streptococcus pneumoniae NOT DETECTED NOT DETECTED   Streptococcus pyogenes NOT DETECTED NOT DETECTED   A.calcoaceticus-baumannii NOT DETECTED NOT DETECTED   Bacteroides fragilis NOT DETECTED NOT DETECTED   Enterobacterales NOT DETECTED NOT DETECTED   Enterobacter cloacae complex NOT DETECTED NOT DETECTED   Escherichia coli NOT DETECTED NOT DETECTED   Klebsiella aerogenes NOT DETECTED NOT DETECTED   Klebsiella oxytoca NOT DETECTED NOT DETECTED   Klebsiella pneumoniae NOT DETECTED NOT DETECTED   Proteus species NOT DETECTED NOT DETECTED   Salmonella species NOT DETECTED NOT DETECTED   Serratia marcescens NOT DETECTED NOT DETECTED   Haemophilus influenzae NOT  DETECTED NOT DETECTED   Neisseria meningitidis NOT DETECTED NOT DETECTED   Pseudomonas aeruginosa NOT DETECTED NOT DETECTED   Stenotrophomonas maltophilia NOT DETECTED NOT DETECTED   Candida albicans NOT DETECTED NOT DETECTED   Candida auris NOT DETECTED NOT DETECTED   Candida glabrata NOT DETECTED NOT DETECTED   Candida krusei NOT DETECTED NOT DETECTED   Candida parapsilosis NOT DETECTED NOT DETECTED   Candida tropicalis NOT DETECTED NOT DETECTED   Cryptococcus neoformans/gattii NOT DETECTED NOT DETECTED   Methicillin resistance mecA/C DETECTED (A) NOT DETECTED    Doreene Eland, PharmD, BCPS.   Work Cell: [phone] 11/28/2020 3:32 PM

## 2020-11-28 NOTE — ED Notes (Signed)
Pt assessed sleeping in bed with eyes closed, bipap intact, oxygen sat 100%, rise and fall of chest noted. Wife at bedside, monitor intact on pt, bed locked and low, call light in reach.

## 2020-11-29 ENCOUNTER — Encounter: Payer: Self-pay | Admitting: Family Medicine

## 2020-11-29 DIAGNOSIS — R918 Other nonspecific abnormal finding of lung field: Secondary | ICD-10-CM

## 2020-11-29 DIAGNOSIS — J9602 Acute respiratory failure with hypercapnia: Secondary | ICD-10-CM | POA: Diagnosis not present

## 2020-11-29 DIAGNOSIS — Z87891 Personal history of nicotine dependence: Secondary | ICD-10-CM | POA: Diagnosis not present

## 2020-11-29 DIAGNOSIS — J9601 Acute respiratory failure with hypoxia: Secondary | ICD-10-CM | POA: Diagnosis not present

## 2020-11-29 DIAGNOSIS — J189 Pneumonia, unspecified organism: Secondary | ICD-10-CM | POA: Diagnosis not present

## 2020-11-29 LAB — CBC
HCT: 36.6 % — ABNORMAL LOW (ref 39.0–52.0)
Hemoglobin: 11.7 g/dL — ABNORMAL LOW (ref 13.0–17.0)
MCH: 29.6 pg (ref 26.0–34.0)
MCHC: 32 g/dL (ref 30.0–36.0)
MCV: 92.7 fL (ref 80.0–100.0)
Platelets: 268 10*3/uL (ref 150–400)
RBC: 3.95 MIL/uL — ABNORMAL LOW (ref 4.22–5.81)
RDW: 14.3 % (ref 11.5–15.5)
WBC: 13.6 10*3/uL — ABNORMAL HIGH (ref 4.0–10.5)
nRBC: 0.1 % (ref 0.0–0.2)

## 2020-11-29 LAB — BASIC METABOLIC PANEL
Anion gap: 9 (ref 5–15)
BUN: 24 mg/dL — ABNORMAL HIGH (ref 8–23)
CO2: 32 mmol/L (ref 22–32)
Calcium: 7.9 mg/dL — ABNORMAL LOW (ref 8.9–10.3)
Chloride: 98 mmol/L (ref 98–111)
Creatinine, Ser: 0.78 mg/dL (ref 0.61–1.24)
GFR, Estimated: >60 mL/min (ref >60)
Glucose, Bld: 129 mg/dL — ABNORMAL HIGH (ref 70–99)
Potassium: 4.2 mmol/L (ref 3.5–5.1)
Sodium: 139 mmol/L (ref 135–145)

## 2020-11-29 MED ORDER — IPRATROPIUM-ALBUTEROL 0.5-2.5 (3) MG/3ML IN SOLN
3.0000 mL | Freq: Three times a day (TID) | RESPIRATORY_TRACT | Status: DC
  Administered 2020-11-29 – 2020-12-02 (×8): 3 mL via RESPIRATORY_TRACT
  Filled 2020-11-29 (×8): qty 3

## 2020-11-29 NOTE — Assessment & Plan Note (Signed)
#  80 year old male patient with a history of COPD and also recently diagnosed left lung mass is currently admitted hospital for worsening shortness of breath/acute respiratory failure.  #Left lung mass-approximately 6-7 centimeters in size; mediastinal adenopathy; left supra-clavicular adenopathy-highly concerning for malignancy.  #Acute on chronic respiratory failure/right-sided pneumonia noted in addition to atelectatic changes noted left lower lobe lung/mass.  On antibiotics/steroids clinically improving.  Recommendation:  #I reviewed my significant concerns for underlying malignancy based on imaging.  Patient awaiting biopsy with Dr. Ellard Artis 10/24.  We will make patient n.p.o. postmidnight.  #If patient cannot get a bronchoscopy/biopsy for any reason-left supraclavicular adenopathy could be considered for core biopsy.   # I reviewed the blood work- with the patient/wife and daughter by the bedside in detail; also reviewed the imaging independently [as summarized above]; and with the patient in detail.

## 2020-11-29 NOTE — Consult Note (Signed)
[place] NOTE  Patient Care Team: Adin Hector, MD as PCP - General (Internal Medicine) Telford Nab, RN as Oncology Nurse Navigator  CHIEF COMPLAINTS/PURPOSE OF CONSULTATION: Lung mass  HISTORY OF PRESENTING ILLNESS:  Chris Lozano 80 y.o.  male with chronic respiratory failure 2 days of oxygen/COPD; history of CAD and newly diagnosed left lung mass is currently admitted hospital for worsening shortness of breath.  Patient was evaluated by pulmonary as outpatient-awaiting bronchoscopy/biopsy on 10/24.  However in the interim patient had episode of worsening shortness of breath cough which led to admission to the hospital.  Patient is on antibiotics/steroids.  He is currently on 4 L of oxygen.  Chest CT scan shows-no PE but left lower lobe lung mass 67 cm in size with mediastinal adenopathy.   Overall patient was to have improvement of his breathing.  However is not back to baseline.  He is accompanied by his wife/daughter who are very anxious.  Review of Systems  Constitutional:  Positive for malaise/fatigue and weight loss. Negative for chills, diaphoresis and fever.  HENT:  Negative for nosebleeds and sore throat.   Eyes:  Negative for double vision.  Respiratory:  Positive for cough, sputum production and shortness of breath. Negative for hemoptysis and wheezing.   Cardiovascular:  Negative for chest pain, palpitations, orthopnea and leg swelling.  Gastrointestinal:  Negative for abdominal pain, blood in stool, constipation, diarrhea, heartburn, melena, nausea and vomiting.  Genitourinary:  Negative for dysuria, frequency and urgency.  Musculoskeletal:  Positive for back pain and joint pain.  Skin: Negative.  Negative for itching and rash.  Neurological:  Positive for dizziness. Negative for tingling, focal weakness, weakness and headaches.  Endo/Heme/Allergies:  Does not bruise/bleed easily.  Psychiatric/Behavioral:  Negative for depression. The  patient is not nervous/anxious and does not have insomnia.     MEDICAL HISTORY:  Past Medical History:  Diagnosis Date   Asthma    BPH (benign prostatic hyperplasia)    CAD (coronary artery disease)    COPD (chronic obstructive pulmonary disease) (HCC)    GERD (gastroesophageal reflux disease)    HLD (hyperlipidemia)    Sleep apnea     SURGICAL HISTORY: Past Surgical History:  Procedure Laterality Date   ADENOIDECTOMY     CARDIAC CATHETERIZATION     GALLBLADDER SURGERY     LEFT HEART CATH AND CORONARY ANGIOGRAPHY Left 01/10/2019   Procedure: LEFT HEART CATH AND CORONARY ANGIOGRAPHY;  Surgeon: Teodoro Spray, MD;  Location: [facility];  Service: Cardiovascular;  Laterality: Left;   TONSILLECTOMY      SOCIAL HISTORY: Social History   Socioeconomic History   Marital status: Married    Spouse name: Not on file   Number of children: Not on file   Years of education: Not on file   Highest education level: Not on file  Occupational History   Not on file  Tobacco Use   Smoking status: Former   Smokeless tobacco: Former    Types: Chew   Tobacco comments:    as a teenager  Media planner   Vaping Use: Never used  Substance and Sexual Activity   Alcohol use: Yes    Alcohol/week: 0.0 standard drinks    Comment: occasionally   Drug use: No   Sexual activity: Not on file  Other Topics Concern   Not on file  Social History Narrative   Not on file   Social Determinants of Health   Financial Resource  Strain: Not on file  Food Insecurity: Not on file  Transportation Needs: Not on file  Physical Activity: Not on file  Stress: Not on file  Social Connections: Not on file  Intimate Partner Violence: Not on file    FAMILY HISTORY: Family History  Problem Relation Age of Onset   Heart disease Other    Kidney disease Neg Hx    Prostate cancer Neg Hx    Kidney cancer Neg Hx    Bladder Cancer Neg Hx     ALLERGIES:  is allergic to fluocinolone and ciprofloxacin  hcl.  MEDICATIONS:  Current Facility-Administered Medications  Medication Dose Route Frequency Provider Last Rate Last Admin   acetaminophen (TYLENOL) tablet 650 mg  650 mg Oral Q6H PRN Mansy, Arvella Merles, MD       Or   acetaminophen (TYLENOL) suppository 650 mg  650 mg Rectal Q6H PRN Mansy, Jan A, MD       albuterol (PROVENTIL) (2.5 MG/3ML) 0.083% nebulizer solution 2.5 mg  2.5 mg Nebulization Q6H PRN Renda Rolls, RPH       Ampicillin-Sulbactam (UNASYN) 3 g in sodium chloride 0.9 % 100 mL IVPB  3 g Intravenous Q8H Wyvonnia Dusky, MD 200 mL/hr at 11/29/20 1032 3 g at 11/29/20 1032   ascorbic acid (VITAMIN C) tablet 500 mg  500 mg Oral q AM Mansy, Jan A, MD   500 mg at 11/29/20 7353   aspirin EC tablet 81 mg  81 mg Oral Daily Mansy, Jan A, MD   81 mg at 11/28/20 0939   atorvastatin (LIPITOR) tablet 80 mg  80 mg Oral Daily Mansy, Jan A, MD   80 mg at 11/29/20 0827   carvedilol (COREG) tablet 3.125 mg  3.125 mg Oral BID Mansy, Jan A, MD   3.125 mg at 11/29/20 0827   chlorpheniramine-HYDROcodone (Nolan) 10-8 MG/5ML suspension 5 mL  5 mL Oral Q12H PRN Mansy, Jan A, MD       cholecalciferol (VITAMIN D) tablet 1,000 Units  1,000 Units Oral Daily Mansy, Jan A, MD   1,000 Units at 11/29/20 0827   enoxaparin (LOVENOX) injection 40 mg  40 mg Subcutaneous Q24H Mansy, Jan A, MD   40 mg at 11/29/20 2992   gabapentin (NEURONTIN) capsule 100 mg  100 mg Oral Daily PRN Mansy, Jan A, MD       guaiFENesin (MUCINEX) 12 hr tablet 600 mg  600 mg Oral BID Mansy, Jan A, MD   600 mg at 11/29/20 4268   ipratropium-albuterol (DUONEB) 0.5-2.5 (3) MG/3ML nebulizer solution 3 mL  3 mL Nebulization TID Wyvonnia Dusky, MD   3 mL at 11/29/20 1325   levothyroxine (SYNTHROID) tablet 25 mcg  25 mcg Oral QAC breakfast Mansy, Jan A, MD   25 mcg at 11/29/20 3419   magnesium hydroxide (MILK OF MAGNESIA) suspension 30 mL  30 mL Oral Daily PRN Mansy, Jan A, MD       multivitamin with minerals tablet 1 tablet  1 tablet Oral q  AM Mansy, Jan A, MD   1 tablet at 11/29/20 0828   ondansetron [facility]) tablet 4 mg  4 mg Oral Q6H PRN Mansy, Jan A, MD       Or   ondansetron [facility]) injection 4 mg  4 mg Intravenous Q6H PRN Mansy, Jan A, MD       predniSONE (DELTASONE) tablet 40 mg  40 mg Oral Q breakfast Mansy, Jan A, MD   40 mg at 11/29/20 [id number]  sodium zirconium cyclosilicate (LOKELMA) packet 10 g  10 g Oral Once Wyvonnia Dusky, MD       traZODone (DESYREL) tablet 25 mg  25 mg Oral QHS PRN Mansy, Jan A, MD        PHYSICAL EXAMINATION:  Vitals:   11/29/20 1325 11/29/20 1531  BP:  101/66  Pulse:  72  Resp:  18  Temp:  98.2 F (36.8 C)  SpO2: 90% 92%   Filed Weights   11/29/20 0310  Weight: 157 lb (71.2 kg)   Patient on 4 L of oxygen.  Accompanied by his daughter and wife.  Physical Exam Vitals and nursing note reviewed.  HENT:     Head: Normocephalic and atraumatic.     Mouth/Throat:     Pharynx: Oropharynx is clear.  Eyes:     Extraocular Movements: Extraocular movements intact.     Pupils: Pupils are equal, round, and reactive to light.  Cardiovascular:     Rate and Rhythm: Normal rate and regular rhythm.  Pulmonary:     Comments: Decreased breath sounds bilaterally.  Abdominal:     Palpations: Abdomen is soft.  Musculoskeletal:        General: Normal range of motion.     Cervical back: Normal range of motion.  Skin:    General: Skin is warm.  Neurological:     General: No focal deficit present.     Mental Status: He is alert and oriented to person, place, and time.  Psychiatric:        Behavior: Behavior normal.        Judgment: Judgment normal.     LABORATORY DATA:  I have reviewed the data as listed Lab Results  Component Value Date   WBC 13.6 (H) 11/29/2020   HGB 11.7 (L) 11/29/2020   HCT 36.6 (L) 11/29/2020   MCV 92.7 11/29/2020   PLT 268 11/29/2020   Recent Labs    11/03/20 1703 11/04/20 0619 11/05/20 0335 11/27/20 1944 11/28/20 0616 11/28/20 1730 11/29/20 0400   NA 138 138   < > 138 139  --  139  K 4.5 4.6   < > 5.0 5.5* 4.1 4.2  CL 96* 97*   < > 98 102  --  98  CO2 37* 33*   < > 33* 33*  --  32  GLUCOSE 157* 157*   < > 161* 168*  --  129*  BUN 15 18   < > 20 21  --  24*  CREATININE 0.90 0.79   < > 1.00 0.79  --  0.78  CALCIUM 8.1* 8.2*   < > 8.4* 8.2*  --  7.9*  GFRNONAA >60 >60   < > >60 >60  --  >60  PROT 7.1 6.5  --  6.9  --   --   --   ALBUMIN 3.0* 2.6*  --  3.1*  --   --   --   AST 32 28  --  32  --   --   --   ALT 29 24  --  29  --   --   --   ALKPHOS 89 73  --  90  --   --   --   BILITOT 1.0 0.9  --  0.8  --   --   --    < > = values in this interval not displayed.    RADIOGRAPHIC STUDIES: I have personally reviewed the radiological images as listed and  agreed with the findings in the report. CT Angio Chest PE W and/or Wo Contrast  Result Date: 11/28/2020 CLINICAL DATA:  Increased shortness of breath, hypoxia, fever EXAM: CT ANGIOGRAPHY CHEST WITH CONTRAST TECHNIQUE: Multidetector CT imaging of the chest was performed using the standard protocol during bolus administration of intravenous contrast. Multiplanar CT image reconstructions and MIPs were obtained to evaluate the vascular anatomy. CONTRAST:  35mL OMNIPAQUE IOHEXOL 350 MG/ML SOLN COMPARISON:  PET-CT dated 11/19/2020 FINDINGS: Cardiovascular: Satisfactory opacification the bilateral pulmonary arteries to the segmental level. No evidence of pulmonary embolism. Although not tailored for evaluation of the thoracic aorta, there is no evidence of thoracic aortic aneurysm or dissection. Atherosclerotic calcifications of the aortic arch. Heart is normal in size.  No pericardial effusion. Coronary atherosclerosis of the LAD and right coronary artery. Mediastinum/Nodes: 10 mm short axis left supraclavicular node (series 4/image 10), unchanged. Mediastinal lymphadenopathy, including a dominant 2.5 cm short axis subcarinal node (series 4/image 55), similar. Visualized thyroid is unremarkable.  Lungs/Pleura: 4.5 x 6.7 cm left lower lobe mass (series 6/image 56), compatible with primary bronchogenic neoplasm when correlating with recent PET. Mild subpleural patchy opacities in the left lower lobe (series 6/image 33), unchanged, possibly post obstructive. New patchy opacities in the central right middle lobe (series 6/image 32), suspicious for pneumonia. Small right pleural effusion. Associated right lower lobe atelectasis. Moderate centrilobular and paraseptal emphysematous changes, upper lung predominant. No pneumothorax. Upper Abdomen: Visualized upper abdomen is notable for prior cholecystectomy and vascular calcifications. Musculoskeletal: Mild degenerative changes of the visualized thoracolumbar spine. Review of the MIP images confirms the above findings. IMPRESSION: No evidence of pulmonary embolism. 6.7 cm left lower lobe mass, compatible with primary bronchogenic neoplasm when correlating with recent CT. Associated thoracic nodal metastases. Mild subpleural patchy opacities in the left lower lobe, unchanged, possibly postobstructive. New patchy opacities in the central right middle lobe, suspicious for pneumonia. Aortic Atherosclerosis (ICD10-I70.0) and Emphysema (ICD10-J43.9). Electronically Signed   By: Julian Hy M.D.   On: 11/28/2020 00:28   MR BRAIN W WO CONTRAST  Result Date: 11/05/2020 CLINICAL DATA:  Non-small-cell lung cancer staging EXAM: MRI HEAD WITHOUT AND WITH CONTRAST TECHNIQUE: Multiplanar, multiecho pulse sequences of the brain and surrounding structures were obtained without and with intravenous contrast. CONTRAST:  18mL GADAVIST GADOBUTROL 1 MMOL/ML IV SOLN COMPARISON:  CT head 10/27/2020 FINDINGS: Brain: No acute infarction, hemorrhage, hydrocephalus, extra-axial collection or mass lesion. Generalized atrophy. Mild to moderate white matter hyperintensity bilaterally. Hyperintensity in the central pons. Normal enhancement.  Negative for metastatic disease to the brain.  Vascular: Normal arterial flow voids at the skull base Skull and upper cervical spine: Negative Sinuses/Orbits: Mild mucosal edema paranasal sinuses. Bilateral cataract extraction Other: None IMPRESSION: Negative for metastatic disease to brain Atrophy and chronic microvascular ischemic change in the white matter. Electronically Signed   By: Franchot Gallo M.D.   On: 11/05/2020 19:51   NM PET Image Initial (PI) Skull Base To Thigh  Result Date: 11/20/2020 CLINICAL DATA:  Initial treatment strategy for pulmonary nodule. EXAM: NUCLEAR MEDICINE PET SKULL BASE TO THIGH TECHNIQUE: 9.46 mCi F-18 FDG was injected intravenously. Full-ring PET imaging was performed from the skull base to thigh after the radiotracer. CT data was obtained and used for attenuation correction and anatomic localization. Fasting blood glucose: 99 mg/dl COMPARISON:  Chest CT dated October 27, 2020 FINDINGS: Mediastinal blood pool activity: SUV max 2.8 Liver activity: SUV max 3.2 NECK: No hypermetabolic lymph nodes in the neck. Incidental CT findings: none  CHEST: Large hypermetabolic left lower lobe lung mass measuring approximately 6.4 x 4.5 cm, unchanged when remeasured in similar plane, with of SUV max of 18.6. Adjacent ground-glass opacities which possibly represent postobstructive changes versus lymphangitic carcinomatosis. Enlarged and hypermetabolic mediastinal, left supraclavicular lymph nodes, and bilateral cardiophrenic lymph nodes. Reference subcarinal lymph node measures 2.8 cm in short axis on image 100 with an SUV max of 13.5. Reference right lower paratracheal lymph node measures 1.7 cm in short axis on image 86 with an SUV max of 12.5. Reference left supraclavicular lymph node measures 9 mm in short axis on image 58 with an SUV max of 6.1. Right cardiophrenic lymph node measuring 7 mm in short axis on image 119 with an SUV max of 3.1. Incidental CT findings: Three-vessel coronary artery calcifications. Atherosclerotic disease  of the thoracic aorta. Severe upper lobe predominant centrilobular emphysema. Ground-glass opacity of the right middle lobe with mild FDG uptake, likely scarring related to prior infection. ABDOMEN/PELVIS: No abnormal hypermetabolic activity within the liver, pancreas, adrenal glands, or spleen. No hypermetabolic lymph nodes in the abdomen or pelvis. Incidental CT findings: Cholecystectomy clips diverticulosis of the descending and sigmoid colon. Atherosclerotic disease of the abdominal aorta. SKELETON: No focal hypermetabolic activity to suggest skeletal metastasis. Incidental CT findings: none IMPRESSION: Large hypermetabolic left lower lobe lung mass with adjacent hypermetabolic ground-glass opacities which possibly represent postobstructive change versus lymphangitic carcinomatosis. Enlarged and hypermetabolic mediastinal, left supraclavicular lymph nodes, and bilateral cardiophrenic lymph nodes, compatible with metastatic disease. No evidence of metastatic disease in the abdomen or pelvis. Aortic Atherosclerosis (ICD10-I70.0) and Emphysema (ICD10-J43.9). Electronically Signed   By: Yetta Glassman M.D.   On: 11/20/2020 16:26   DG Chest Port 1 View  Result Date: 11/27/2020 CLINICAL DATA:  Sepsis, increasing shortness of breath, left lung mass EXAM: PORTABLE CHEST 1 VIEW COMPARISON:  11/03/2020, 11/19/2020 FINDINGS: 2 frontal views of the chest demonstrate a stable cardiac silhouette. The masslike left lower lobe consolidation seen on prior PET scan is again identified unchanged, concerning for malignancy based on previous PET scan findings. The adjacent interstitial and ground-glass opacities throughout the left lung could reflect lymphangitic spread of disease. Stable background scarring elsewhere throughout the lungs. No new consolidation, effusion, or pneumothorax. No acute bony abnormality. IMPRESSION: 1. Stable masslike left lower lobe consolidation concerning for malignancy based on previous PET scan  findings. Interstitial opacities throughout the left lower lobe could reflect lymphangitic spread of disease. 2. Otherwise stable background scarring without new airspace disease or effusion. Electronically Signed   By: Randa Ngo M.D.   On: 11/27/2020 20:04   DG Chest Portable 1 View  Result Date: 11/03/2020 CLINICAL DATA:  Short of breath, COPD EXAM: PORTABLE CHEST 1 VIEW COMPARISON:  10/27/2020 FINDINGS: Single frontal view of the chest demonstrates stable masslike consolidation at the left hilum. There is increasing consolidation within the left lung, with underlying volume loss. Background emphysema is again noted. Developing consolidation at the right lung base may be hypoventilatory. No large effusion or pneumothorax. IMPRESSION: 1. Persistent masslike consolidation at the left hilum, concerning for malignancy based on prior CT 10/27/2020. 2. Increasing left lung consolidation, which could reflect postobstructive change. 3. Developing right basilar consolidation, favor atelectasis. 4. Stable background emphysema. Electronically Signed   By: Randa Ngo M.D.   On: 11/03/2020 18:15    Mass of lower lobe of left lung #80 year old male patient with a history of COPD and also recently diagnosed left lung mass is currently admitted hospital for worsening shortness  of breath/acute respiratory failure.  #Left lung mass-approximately 6-7 centimeters in size; mediastinal adenopathy; left supra-clavicular adenopathy-highly concerning for malignancy.  #Acute on chronic respiratory failure/right-sided pneumonia noted in addition to atelectatic changes noted left lower lobe lung/mass.  On antibiotics/steroids clinically improving.  Recommendation:  #I reviewed my significant concerns for underlying malignancy based on imaging.  Patient awaiting biopsy with Dr. Ellard Artis 10/24.  We will make patient n.p.o. postmidnight.  #If patient cannot get a bronchoscopy/biopsy for any reason-left  supraclavicular adenopathy could be considered for core biopsy.   # I reviewed the blood work- with the patient/wife and daughter by the bedside in detail; also reviewed the imaging independently [as summarized above]; and with the patient in detail.   All questions were answered. The patient knows to call the clinic with any problems, questions or concerns.    Cammie Sickle, MD 11/29/2020 4:17 PM

## 2020-11-29 NOTE — Progress Notes (Signed)
PROGRESS NOTE    Chris Lozano  VZC:[id number] DOB: Jun 15, 1940 DOA: 11/27/2020 PCP: Adin Hector, MD   Assessment & Plan:   Active Problems:   Acute respiratory failure (HCC)  Acute hypoxic and hypercarbic respiratory failure: secondary to COPD exacerbation superimposed on bacterial postobstructive pneumonia secondary to left lower lung mass. Continue on IV unasyn, steroids, bronchodilators & supplemental oxygen. Encourage incentive spirometry. COVID19 and influenza are both neg   Lung mass: was supposed get outpatient lung biopsy 10/24 at [facility] w/ Dr. Lanney Gins. Onco consulted. Dr. Lanney Gins is not on call today   Post obstructive pneumonia: likely secondary to lung mass. Continue on IV unasyn, bronchodilators & supplemental oxygen   Sepsis: met criteria w/ tachypnea, tachycardia and likely postobstructive pneumonia.Sepsis resolved  Hyperkalemia: resolved  Unlikely bacteremia: blood cxs growing stap epi. Likely containment. Repeat blood cxs are pending   HTN: continue on home dose of BB   Hypothyroidism: continue on home dose of levothyroxine   HLD: continue on statin   Hx of CAD: continue on home dose of statin, coreg, & aspirin    DVT prophylaxis: lovenox  Code Status: full  Family Communication: discussed pt's care w/ pt's daughter, [name], and answered her questions  Disposition Plan: depends on PT/OT recs (not consulted yet)   Level of care: Progressive Cardiac  Status is: Inpatient  Remains inpatient appropriate because: severity of illness     Consultants:  Onco   Procedures:   Antimicrobials:    Subjective: Pt c/o shortness of breath slightly improved from day prior   Objective: Vitals:   11/29/20 0134 11/29/20 0232 11/29/20 0310 11/29/20 0435  BP: 116/68   124/76  Pulse: 66 64  63  Resp: 18   18  Temp: 97.8 F (36.6 C)   (!) 97.5 F (36.4 C)  TempSrc:      SpO2: 93% 92%  98%  Weight:   71.2 kg   Height:   5' 6"  (1.676 m)      Intake/Output Summary (Last 24 hours) at 11/29/2020 0800 Last data filed at 11/28/2020 1720 Gross per 24 hour  Intake 99 ml  Output 1300 ml  Net -1201 ml   Filed Weights   11/29/20 0310  Weight: 71.2 kg    Examination:  General exam: Appears comfortable   Respiratory system: course breath sounds b/l.  Cardiovascular system: S1/S2+. No rubs or clicks   Gastrointestinal system: Abd is soft, NT, ND & normal bowel sounds Central nervous system: Alert and oriented. Moves all extremities  Psychiatry: Judgement and insight appear normal. Flat mood and affect    Data Reviewed: I have personally reviewed following labs and imaging studies  CBC: Recent Labs  Lab 11/27/20 1944 11/28/20 0616 11/29/20 0400  WBC 12.0* 8.2 13.6*  NEUTROABS 8.8*  --   --   HGB 13.5 12.9* 11.7*  HCT 42.5 41.3 36.6*  MCV 95.1 95.6 92.7  PLT 334 236 128   Basic Metabolic Panel: Recent Labs  Lab 11/27/20 1944 11/28/20 0616 11/28/20 1730 11/29/20 0400  NA 138 139  --  139  K 5.0 5.5* 4.1 4.2  CL 98 102  --  98  CO2 33* 33*  --  32  GLUCOSE 161* 168*  --  129*  BUN 20 21  --  24*  CREATININE 1.00 0.79  --  0.78  CALCIUM 8.4* 8.2*  --  7.9*   GFR: Estimated Creatinine Clearance: 65.4 mL/min (by C-G formula based on SCr of 0.78 mg/dL).  Liver Function Tests: Recent Labs  Lab 11/27/20 1944  AST 32  ALT 29  ALKPHOS 90  BILITOT 0.8  PROT 6.9  ALBUMIN 3.1*   No results for input(s): LIPASE, AMYLASE in the last 168 hours. No results for input(s): AMMONIA in the last 168 hours. Coagulation Profile: No results for input(s): INR, PROTIME in the last 168 hours. Cardiac Enzymes: No results for input(s): CKTOTAL, CKMB, CKMBINDEX, TROPONINI in the last 168 hours. BNP (last 3 results) No results for input(s): PROBNP in the last 8760 hours. HbA1C: No results for input(s): HGBA1C in the last 72 hours. CBG: No results for input(s): GLUCAP in the last 168 hours. Lipid Profile: No results  for input(s): CHOL, HDL, LDLCALC, TRIG, CHOLHDL, LDLDIRECT in the last 72 hours. Thyroid Function Tests: No results for input(s): TSH, T4TOTAL, FREET4, T3FREE, THYROIDAB in the last 72 hours. Anemia Panel: No results for input(s): VITAMINB12, FOLATE, FERRITIN, TIBC, IRON, RETICCTPCT in the last 72 hours. Sepsis Labs: Recent Labs  Lab 11/27/20 1946 11/28/20 0616  LATICACIDVEN 1.2 1.3    Recent Results (from the past 240 hour(s))  Blood Culture (routine x 2)     Status: None (Preliminary result)   Collection Time: 11/27/20  7:44 PM   Specimen: BLOOD  Result Value Ref Range Status   Specimen Description BLOOD BLOOD LEFT FOREARM  Final   Special Requests   Final    BOTTLES DRAWN AEROBIC AND ANAEROBIC Blood Culture adequate volume   Culture   Final    NO GROWTH < 12 HOURS Performed at [facility], [address] [place], [place] [address]    Report Status PENDING  Incomplete  Blood Culture (routine x 2)     Status: None (Preliminary result)   Collection Time: 11/27/20  7:44 PM   Specimen: BLOOD  Result Value Ref Range Status   Specimen Description BLOOD RIGHT ANTECUBITAL  Final   Special Requests   Final    BOTTLES DRAWN AEROBIC AND ANAEROBIC Blood Culture adequate volume   Culture  Setup Time   Final    Organism ID to follow GRAM POSITIVE COCCI AEROBIC BOTTLE ONLY CRITICAL RESULT CALLED TO, READ BACK BY AND VERIFIED WITH: SUSAN WATSON 11/28/20 Dale Performed at [facility], [address] [place], [place] [address]    Culture GRAM POSITIVE COCCI  Final   Report Status PENDING  Incomplete  Resp Panel by RT-PCR (Flu A&B, Covid) Nasopharyngeal Swab     Status: None   Collection Time: 11/27/20  7:44 PM   Specimen: Nasopharyngeal Swab; Nasopharyngeal(NP) swabs in vial transport medium  Result Value Ref Range Status   SARS Coronavirus 2 by RT PCR NEGATIVE NEGATIVE Final    Comment: (NOTE) SARS-CoV-2 target nucleic acids are NOT DETECTED.  The  SARS-CoV-2 RNA is generally detectable in upper respiratory specimens during the acute phase of infection. The lowest concentration of SARS-CoV-2 viral copies this assay can detect is 138 copies/mL. A negative result does not preclude SARS-Cov-2 infection and should not be used as the sole basis for treatment or other patient management decisions. A negative result may occur with  improper specimen collection/handling, submission of specimen other than nasopharyngeal swab, presence of viral mutation(s) within the areas targeted by this assay, and inadequate number of viral copies(<138 copies/mL). A negative result must be combined with clinical observations, patient history, and epidemiological information. The expected result is Negative.  Fact Sheet for Patients:  EntrepreneurPulse.com.au  Fact Sheet for Healthcare Providers:  IncredibleEmployment.be  This test is no t yet approved or cleared by the [place] and  has been authorized for detection and/or diagnosis of SARS-CoV-2 by FDA under an Emergency Use Authorization (EUA). This EUA will remain  in effect (meaning this test can be used) for the duration of the COVID-19 declaration under Section 564(b)(1) of the Act, 21 U.S.C.section 360bbb-3(b)(1), unless the authorization is terminated  or revoked sooner.       Influenza A by PCR NEGATIVE NEGATIVE Final   Influenza B by PCR NEGATIVE NEGATIVE Final    Comment: (NOTE) The Xpert Xpress SARS-CoV-2/FLU/RSV plus assay is intended as an aid in the diagnosis of influenza from Nasopharyngeal swab specimens and should not be used as a sole basis for treatment. Nasal washings and aspirates are unacceptable for Xpert Xpress SARS-CoV-2/FLU/RSV testing.  Fact Sheet for Patients: EntrepreneurPulse.com.au  Fact Sheet for Healthcare Providers: IncredibleEmployment.be  This test is not yet approved or  cleared by the [place] FDA and has been authorized for detection and/or diagnosis of SARS-CoV-2 by FDA under an Emergency Use Authorization (EUA). This EUA will remain in effect (meaning this test can be used) for the duration of the COVID-19 declaration under Section 564(b)(1) of the Act, 21 U.S.C. section 360bbb-3(b)(1), unless the authorization is terminated or revoked.  Performed at [facility], [address] [place], [place] [address]   Blood Culture ID Panel (Reflexed)     Status: Abnormal   Collection Time: 11/27/20  7:44 PM  Result Value Ref Range Status   Enterococcus faecalis NOT DETECTED NOT DETECTED Final   Enterococcus Faecium NOT DETECTED NOT DETECTED Final   Listeria monocytogenes NOT DETECTED NOT DETECTED Final   Staphylococcus species DETECTED (A) NOT DETECTED Final    Comment: CRITICAL RESULT CALLED TO, READ BACK BY AND VERIFIED WITH: SUSAN WATSON 11/28/20 1526 KLW    Staphylococcus aureus (BCID) NOT DETECTED NOT DETECTED Final   Staphylococcus epidermidis DETECTED (A) NOT DETECTED Final    Comment: Methicillin (oxacillin) resistant coagulase negative staphylococcus. Possible blood culture contaminant (unless isolated from more than one blood culture draw or clinical case suggests pathogenicity). No antibiotic treatment is indicated for blood  culture contaminants. CRITICAL RESULT CALLED TO, READ BACK BY AND VERIFIED WITH: SUSAN WATSON 11/28/20 1526 KLW    Staphylococcus lugdunensis NOT DETECTED NOT DETECTED Final   Streptococcus species NOT DETECTED NOT DETECTED Final   Streptococcus agalactiae NOT DETECTED NOT DETECTED Final   Streptococcus pneumoniae NOT DETECTED NOT DETECTED Final   Streptococcus pyogenes NOT DETECTED NOT DETECTED Final   A.calcoaceticus-baumannii NOT DETECTED NOT DETECTED Final   Bacteroides fragilis NOT DETECTED NOT DETECTED Final   Enterobacterales NOT DETECTED NOT DETECTED Final   Enterobacter cloacae complex NOT  DETECTED NOT DETECTED Final   Escherichia coli NOT DETECTED NOT DETECTED Final   Klebsiella aerogenes NOT DETECTED NOT DETECTED Final   Klebsiella oxytoca NOT DETECTED NOT DETECTED Final   Klebsiella pneumoniae NOT DETECTED NOT DETECTED Final   Proteus species NOT DETECTED NOT DETECTED Final   Salmonella species NOT DETECTED NOT DETECTED Final   Serratia marcescens NOT DETECTED NOT DETECTED Final   Haemophilus influenzae NOT DETECTED NOT DETECTED Final   Neisseria meningitidis NOT DETECTED NOT DETECTED Final   Pseudomonas aeruginosa NOT DETECTED NOT DETECTED Final   Stenotrophomonas maltophilia NOT DETECTED NOT DETECTED Final   Candida albicans NOT DETECTED NOT DETECTED Final   Candida auris NOT DETECTED NOT DETECTED Final   Candida glabrata NOT DETECTED NOT DETECTED Final  Candida krusei NOT DETECTED NOT DETECTED Final   Candida parapsilosis NOT DETECTED NOT DETECTED Final   Candida tropicalis NOT DETECTED NOT DETECTED Final   Cryptococcus neoformans/gattii NOT DETECTED NOT DETECTED Final   Methicillin resistance mecA/C DETECTED (A) NOT DETECTED Final    Comment: CRITICAL RESULT CALLED TO, READ BACK BY AND VERIFIED WITH: SUSAN WATSON 11/28/20 1526 KLW Performed at [facility], [address] [place], [place] [address]   MRSA Next Gen by PCR, Nasal     Status: None   Collection Time: 11/28/20  7:49 PM   Specimen: Nasal Mucosa; Nasal Swab  Result Value Ref Range Status   MRSA by PCR Next Gen NOT DETECTED NOT DETECTED Final    Comment: (NOTE) The GeneXpert MRSA Assay (FDA approved for NASAL specimens only), is one component of a comprehensive MRSA colonization surveillance program. It is not intended to diagnose MRSA infection nor to guide or monitor treatment for MRSA infections. Test performance is not FDA approved in patients less than 59 years old. Performed at [facility], [address] [place], [place] [address]          Radiology Studies: CT  Angio Chest PE W and/or Wo Contrast  Result Date: 11/28/2020 CLINICAL DATA:  Increased shortness of breath, hypoxia, fever EXAM: CT ANGIOGRAPHY CHEST WITH CONTRAST TECHNIQUE: Multidetector CT imaging of the chest was performed using the standard protocol during bolus administration of intravenous contrast. Multiplanar CT image reconstructions and MIPs were obtained to evaluate the vascular anatomy. CONTRAST:  13m OMNIPAQUE IOHEXOL 350 MG/ML SOLN COMPARISON:  PET-CT dated 11/19/2020 FINDINGS: Cardiovascular: Satisfactory opacification the bilateral pulmonary arteries to the segmental level. No evidence of pulmonary embolism. Although not tailored for evaluation of the thoracic aorta, there is no evidence of thoracic aortic aneurysm or dissection. Atherosclerotic calcifications of the aortic arch. Heart is normal in size.  No pericardial effusion. Coronary atherosclerosis of the LAD and right coronary artery. Mediastinum/Nodes: 10 mm short axis left supraclavicular node (series 4/image 10), unchanged. Mediastinal lymphadenopathy, including a dominant 2.5 cm short axis subcarinal node (series 4/image 55), similar. Visualized thyroid is unremarkable. Lungs/Pleura: 4.5 x 6.7 cm left lower lobe mass (series 6/image 56), compatible with primary bronchogenic neoplasm when correlating with recent PET. Mild subpleural patchy opacities in the left lower lobe (series 6/image 33), unchanged, possibly post obstructive. New patchy opacities in the central right middle lobe (series 6/image 32), suspicious for pneumonia. Small right pleural effusion. Associated right lower lobe atelectasis. Moderate centrilobular and paraseptal emphysematous changes, upper lung predominant. No pneumothorax. Upper Abdomen: Visualized upper abdomen is notable for prior cholecystectomy and vascular calcifications. Musculoskeletal: Mild degenerative changes of the visualized thoracolumbar spine. Review of the MIP images confirms the above findings.  IMPRESSION: No evidence of pulmonary embolism. 6.7 cm left lower lobe mass, compatible with primary bronchogenic neoplasm when correlating with recent CT. Associated thoracic nodal metastases. Mild subpleural patchy opacities in the left lower lobe, unchanged, possibly postobstructive. New patchy opacities in the central right middle lobe, suspicious for pneumonia. Aortic Atherosclerosis (ICD10-I70.0) and Emphysema (ICD10-J43.9). Electronically Signed   By: SJulian HyM.D.   On: 11/28/2020 00:28   DG Chest Port 1 View  Result Date: 11/27/2020 CLINICAL DATA:  Sepsis, increasing shortness of breath, left lung mass EXAM: PORTABLE CHEST 1 VIEW COMPARISON:  11/03/2020, 11/19/2020 FINDINGS: 2 frontal views of the chest demonstrate a stable cardiac silhouette. The masslike left lower lobe consolidation seen on prior PET scan is again identified unchanged, concerning for malignancy  based on previous PET scan findings. The adjacent interstitial and ground-glass opacities throughout the left lung could reflect lymphangitic spread of disease. Stable background scarring elsewhere throughout the lungs. No new consolidation, effusion, or pneumothorax. No acute bony abnormality. IMPRESSION: 1. Stable masslike left lower lobe consolidation concerning for malignancy based on previous PET scan findings. Interstitial opacities throughout the left lower lobe could reflect lymphangitic spread of disease. 2. Otherwise stable background scarring without new airspace disease or effusion. Electronically Signed   By: Randa Ngo M.D.   On: 11/27/2020 20:04        Scheduled Meds:  ascorbic acid  500 mg Oral q AM   aspirin EC  81 mg Oral Daily   atorvastatin  80 mg Oral Daily   carvedilol  3.125 mg Oral BID   cholecalciferol  1,000 Units Oral Daily   enoxaparin (LOVENOX) injection  40 mg Subcutaneous Q24H   guaiFENesin  600 mg Oral BID   ipratropium-albuterol  3 mL Nebulization QID   levothyroxine  25 mcg Oral QAC  breakfast   multivitamin with minerals  1 tablet Oral q AM   predniSONE  40 mg Oral Q breakfast   sodium zirconium cyclosilicate  10 g Oral Once   Continuous Infusions:  sodium chloride 75 mL/hr at 11/29/20 0143   ampicillin-sulbactam (UNASYN) IV 3 g (11/29/20 0309)     LOS: 2 days    Time spent: 30 mins     Wyvonnia Dusky, MD [facility] Pager [phone]  If 7PM-7AM, please contact night-coverage 11/29/2020, 8:00 AM

## 2020-11-30 DIAGNOSIS — J9601 Acute respiratory failure with hypoxia: Secondary | ICD-10-CM | POA: Diagnosis not present

## 2020-11-30 DIAGNOSIS — J9602 Acute respiratory failure with hypercapnia: Secondary | ICD-10-CM | POA: Diagnosis not present

## 2020-11-30 DIAGNOSIS — J189 Pneumonia, unspecified organism: Secondary | ICD-10-CM | POA: Diagnosis not present

## 2020-11-30 DIAGNOSIS — R918 Other nonspecific abnormal finding of lung field: Secondary | ICD-10-CM | POA: Diagnosis not present

## 2020-11-30 LAB — CBC
HCT: 36.8 % — ABNORMAL LOW (ref 39.0–52.0)
Hemoglobin: 12.3 g/dL — ABNORMAL LOW (ref 13.0–17.0)
MCH: 31.6 pg (ref 26.0–34.0)
MCHC: 33.4 g/dL (ref 30.0–36.0)
MCV: 94.6 fL (ref 80.0–100.0)
Platelets: 234 10*3/uL (ref 150–400)
RBC: 3.89 MIL/uL — ABNORMAL LOW (ref 4.22–5.81)
RDW: 14.7 % (ref 11.5–15.5)
WBC: 12 10*3/uL — ABNORMAL HIGH (ref 4.0–10.5)
nRBC: 0 % (ref 0.0–0.2)

## 2020-11-30 LAB — BASIC METABOLIC PANEL
Anion gap: 6 (ref 5–15)
BUN: 23 mg/dL (ref 8–23)
CO2: 33 mmol/L — ABNORMAL HIGH (ref 22–32)
Calcium: 8 mg/dL — ABNORMAL LOW (ref 8.9–10.3)
Chloride: 102 mmol/L (ref 98–111)
Creatinine, Ser: 0.71 mg/dL (ref 0.61–1.24)
GFR, Estimated: >60 mL/min (ref >60)
Glucose, Bld: 103 mg/dL — ABNORMAL HIGH (ref 70–99)
Potassium: 3.8 mmol/L (ref 3.5–5.1)
Sodium: 141 mmol/L (ref 135–145)

## 2020-11-30 LAB — CULTURE, BLOOD (ROUTINE X 2): Special Requests: ADEQUATE

## 2020-11-30 LAB — URINE CULTURE: Culture: NO GROWTH

## 2020-11-30 NOTE — Consult Note (Signed)
Pulmonary Medicine          Date: 11/30/2020,   MRN# [id number] Chris Lozano 04/30/1940     AdmissionWeight: 71.2 kg                 CurrentWeight: 71.2 kg   Refferring physician: Dr Jimmye Norman   CHIEF COMPLAINT:   Acute on chronic hypoxemic respiratory failure   HISTORY OF PRESENT ILLNESS   This is a very pleasant 80yo M with hx of Advanced COPD, dyslipidemia, OSA/Asthma overlap syndrome who came in to ER with worsening dyspnea. He has supportive family and daughter who keeps close watch over him. I had evaluated patient 11/21/20 in pulmonology clinic with [facility] and he shared that he was inconsistently compliant with his BIPAP and uses 2L bleed in O2 supplementally at his baseline while resting. He had Mass found on chest CT which was appx 4.9cm and we looked at images together and talked about diagnostic options including bronchoscopy with biopsy which patient wanted to have done asap. 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 He is now here in hospital for acute on chronic hypoxemic respiratory failure. PCCM consultation for further evaluation and management.    PAST MEDICAL HISTORY   Past Medical History:  Diagnosis Date  . Asthma   . BPH (benign prostatic hyperplasia)   . CAD (coronary artery disease)   . COPD (chronic obstructive pulmonary disease) ([place])   . GERD (gastroesophageal reflux disease)   . HLD (hyperlipidemia)   . Sleep apnea      SURGICAL HISTORY   Past Surgical History:  Procedure Laterality Date  . ADENOIDECTOMY    . CARDIAC CATHETERIZATION    . GALLBLADDER SURGERY    . LEFT HEART CATH AND CORONARY ANGIOGRAPHY Left 01/10/2019   Procedure: LEFT HEART CATH AND CORONARY ANGIOGRAPHY;  Surgeon: Teodoro Spray, MD;  Location: [facility];  Service: Cardiovascular;  Laterality: Left;  . TONSILLECTOMY       FAMILY HISTORY   Family History  Problem Relation Age of Onset  .  Heart disease Other   . Kidney disease Neg Hx   . Prostate cancer Neg Hx   . Kidney cancer Neg Hx   . Bladder Cancer Neg Hx      SOCIAL HISTORY   Social History   Tobacco Use  . Smoking status: Former  . Smokeless tobacco: Former    Types: Chew  . Tobacco comments:    as a teenager  Vaping Use  . Vaping Use: Never used  Substance Use Topics  . Alcohol use: Yes    Alcohol/week: 0.0 standard drinks    Comment: occasionally  . Drug use: No     MEDICATIONS    Home Medication:    Current Medication:  Current Facility-Administered Medications:  .  acetaminophen (TYLENOL) tablet 650 mg, 650 mg, Oral, Q6H PRN **OR** acetaminophen (TYLENOL) suppository 650 mg, 650 mg, Rectal, Q6H PRN, Mansy, Jan A, MD .  albuterol (PROVENTIL) (2.5 MG/3ML) 0.083% nebulizer solution 2.5 mg, 2.5 mg, Nebulization, Q6H PRN, Renda Rolls, RPH .  Ampicillin-Sulbactam (UNASYN) 3 g in sodium chloride 0.9 % 100 mL IVPB, 3 g, Intravenous, Q8H, Wyvonnia Dusky, MD, Last Rate: 200 mL/hr at 11/30/20 1108, 3 g at 11/30/20 1108 .  ascorbic acid (VITAMIN C) tablet 500 mg, 500 mg, Oral, q AM, Mansy, Jan A, MD, 500 mg at 11/30/20 0849 .  atorvastatin (LIPITOR) tablet 80 mg, 80 mg, Oral, Daily, Mansy, Jan A, MD, 80 mg at 11/30/20 0840 .  carvedilol (COREG) tablet 3.125 mg, 3.125 mg, Oral, BID, Mansy, Jan A, MD, 3.125 mg at 11/30/20 0841 .  chlorpheniramine-HYDROcodone (TUSSIONEX) 10-8 MG/5ML suspension 5 mL, 5 mL, Oral, Q12H PRN, Mansy, Arvella Merles, MD .  cholecalciferol (VITAMIN D) tablet 1,000 Units, 1,000 Units, Oral, Daily, Mansy, Arvella Merles, MD, 1,000 Units at 11/30/20 0840 .  gabapentin (NEURONTIN) capsule 100 mg, 100 mg, Oral, Daily PRN, Mansy, Jan A, MD .  guaiFENesin (MUCINEX) 12 hr tablet 600 mg, 600 mg, Oral, BID, Mansy, Jan A, MD, 600 mg at 11/30/20 0840 .  ipratropium-albuterol (DUONEB) 0.5-2.5 (3) MG/3ML nebulizer solution 3 mL, 3 mL, Nebulization, TID, Wyvonnia Dusky, MD, 3 mL at 11/30/20 1411 .   levothyroxine (SYNTHROID) tablet 25 mcg, 25 mcg, Oral, QAC breakfast, Mansy, Jan A, MD, 25 mcg at 11/30/20 1610 .  magnesium hydroxide (MILK OF MAGNESIA) suspension 30 mL, 30 mL, Oral, Daily PRN, Mansy, Jan A, MD .  multivitamin with minerals tablet 1 tablet, 1 tablet, Oral, q AM, Mansy, Arvella Merles, MD, 1 tablet at 11/30/20 0840 .  ondansetron (ZOFRAN) tablet 4 mg, 4 mg, Oral, Q6H PRN **OR** ondansetron (ZOFRAN) injection 4 mg, 4 mg, Intravenous, Q6H PRN, Mansy, Jan A, MD .  [COMPLETED] methylPREDNISolone sodium succinate (SOLU-MEDROL) 40 mg/mL injection 40 mg, 40 mg, Intravenous, Q12H, 40 mg at 11/28/20 1308 **FOLLOWED BY** predniSONE (DELTASONE) tablet 40 mg, 40 mg, Oral, Q breakfast, Mansy, Jan A, MD, 40 mg at 11/30/20 0841 .  sodium zirconium cyclosilicate (LOKELMA) packet 10 g, 10 g, Oral, Once, Wyvonnia Dusky, MD .  traZODone (DESYREL) tablet 25 mg, 25 mg, Oral, QHS PRN, Mansy, Arvella Merles, MD    ALLERGIES   Fluocinolone and Ciprofloxacin hcl     REVIEW OF SYSTEMS    Review of Systems:  Gen:  Denies  fever, sweats, chills weigh loss  HEENT: Denies blurred vision, double vision, ear pain, eye pain, hearing loss, nose bleeds, sore throat Cardiac:  No dizziness, chest pain or heaviness, chest tightness,edema Resp:   Denies cough or sputum porduction, shortness of breath,wheezing, hemoptysis,  Gi: Denies swallowing difficulty, stomach pain, nausea or vomiting, diarrhea, constipation, bowel incontinence Gu:  Denies bladder incontinence, burning urine Ext:   Denies Joint pain, stiffness or swelling Skin: Denies  skin rash, easy bruising or bleeding or hives Endoc:  Denies polyuria, polydipsia , polyphagia or weight change Psych:   Denies depression, insomnia or hallucinations   Other:  All other systems negative   VS: BP 113/72 (BP Location: Left Arm)   Pulse 76   Temp 97.9 F (36.6 C)   Resp 18   Ht 5\' 6"  (1.676 m)   Wt 71.2 kg   SpO2 92%   BMI 25.34 kg/m      PHYSICAL  EXAM    GENERAL:NAD, no fevers, chills, no weakness no fatigue HEAD: Normocephalic, atraumatic.  EYES: Pupils equal, round, reactive to light. Extraocular muscles intact. No scleral icterus.  MOUTH: Moist mucosal membrane. Dentition intact. No abscess noted.  EAR, NOSE, THROAT: Clear without exudates. No external lesions.  NECK: Supple. No thyromegaly. No nodules. No JVD.  PULMONARY: bilateral mild rhonchi CARDIOVASCULAR: S1 and S2. Regular rate and rhythm. No murmurs, rubs, or gallops. No edema. Pedal pulses 2+ bilaterally.  GASTROINTESTINAL: Soft, nontender, nondistended. No masses. Positive bowel sounds. No hepatosplenomegaly.  MUSCULOSKELETAL: No swelling, clubbing, or edema. Range of motion full  in all extremities.  NEUROLOGIC: Cranial nerves II through XII are intact. No gross focal neurological deficits. Sensation intact. Reflexes intact.  SKIN: No ulceration, lesions, rashes, or cyanosis. Skin warm and dry. Turgor intact.  PSYCHIATRIC: Mood, affect within normal limits. The patient is awake, alert and oriented x 3. Insight, judgment intact.       IMAGING    CT Angio Chest PE W and/or Wo Contrast  Result Date: 11/28/2020 CLINICAL DATA:  Increased shortness of breath, hypoxia, fever EXAM: CT ANGIOGRAPHY CHEST WITH CONTRAST TECHNIQUE: Multidetector CT imaging of the chest was performed using the standard protocol during bolus administration of intravenous contrast. Multiplanar CT image reconstructions and MIPs were obtained to evaluate the vascular anatomy. CONTRAST:  28mL OMNIPAQUE IOHEXOL 350 MG/ML SOLN COMPARISON:  PET-CT dated 11/19/2020 FINDINGS: Cardiovascular: Satisfactory opacification the bilateral pulmonary arteries to the segmental level. No evidence of pulmonary embolism. Although not tailored for evaluation of the thoracic aorta, there is no evidence of thoracic aortic aneurysm or dissection. Atherosclerotic calcifications of the aortic arch. Heart is normal in size.  No  pericardial effusion. Coronary atherosclerosis of the LAD and right coronary artery. Mediastinum/Nodes: 10 mm short axis left supraclavicular node (series 4/image 10), unchanged. Mediastinal lymphadenopathy, including a dominant 2.5 cm short axis subcarinal node (series 4/image 55), similar. Visualized thyroid is unremarkable. Lungs/Pleura: 4.5 x 6.7 cm left lower lobe mass (series 6/image 56), compatible with primary bronchogenic neoplasm when correlating with recent PET. Mild subpleural patchy opacities in the left lower lobe (series 6/image 33), unchanged, possibly post obstructive. New patchy opacities in the central right middle lobe (series 6/image 32), suspicious for pneumonia. Small right pleural effusion. Associated right lower lobe atelectasis. Moderate centrilobular and paraseptal emphysematous changes, upper lung predominant. No pneumothorax. Upper Abdomen: Visualized upper abdomen is notable for prior cholecystectomy and vascular calcifications. Musculoskeletal: Mild degenerative changes of the visualized thoracolumbar spine. Review of the MIP images confirms the above findings. IMPRESSION: No evidence of pulmonary embolism. 6.7 cm left lower lobe mass, compatible with primary bronchogenic neoplasm when correlating with recent CT. Associated thoracic nodal metastases. Mild subpleural patchy opacities in the left lower lobe, unchanged, possibly postobstructive. New patchy opacities in the central right middle lobe, suspicious for pneumonia. Aortic Atherosclerosis (ICD10-I70.0) and Emphysema (ICD10-J43.9). Electronically Signed   By: Julian Hy M.D.   On: 11/28/2020 00:28   MR BRAIN W WO CONTRAST  Result Date: 11/05/2020 CLINICAL DATA:  Non-small-cell lung cancer staging EXAM: MRI HEAD WITHOUT AND WITH CONTRAST TECHNIQUE: Multiplanar, multiecho pulse sequences of the brain and surrounding structures were obtained without and with intravenous contrast. CONTRAST:  82mL GADAVIST GADOBUTROL 1  MMOL/ML IV SOLN COMPARISON:  CT head 10/27/2020 FINDINGS: Brain: No acute infarction, hemorrhage, hydrocephalus, extra-axial collection or mass lesion. Generalized atrophy. Mild to moderate white matter hyperintensity bilaterally. Hyperintensity in the central pons. Normal enhancement.  Negative for metastatic disease to the brain. Vascular: Normal arterial flow voids at the skull base Skull and upper cervical spine: Negative Sinuses/Orbits: Mild mucosal edema paranasal sinuses. Bilateral cataract extraction Other: None IMPRESSION: Negative for metastatic disease to brain Atrophy and chronic microvascular ischemic change in the white matter. Electronically Signed   By: Franchot Gallo M.D.   On: 11/05/2020 19:51   NM PET Image Initial (PI) Skull Base To Thigh  Result Date: 11/20/2020 CLINICAL DATA:  Initial treatment strategy for pulmonary nodule. EXAM: NUCLEAR MEDICINE PET SKULL BASE TO THIGH TECHNIQUE: 9.46 mCi F-18 FDG was injected intravenously. Full-ring PET imaging was performed from the  skull base to thigh after the radiotracer. CT data was obtained and used for attenuation correction and anatomic localization. Fasting blood glucose: 99 mg/dl COMPARISON:  Chest CT dated October 27, 2020 FINDINGS: Mediastinal blood pool activity: SUV max 2.8 Liver activity: SUV max 3.2 NECK: No hypermetabolic lymph nodes in the neck. Incidental CT findings: none CHEST: Large hypermetabolic left lower lobe lung mass measuring approximately 6.4 x 4.5 cm, unchanged when remeasured in similar plane, with of SUV max of 18.6. Adjacent ground-glass opacities which possibly represent postobstructive changes versus lymphangitic carcinomatosis. Enlarged and hypermetabolic mediastinal, left supraclavicular lymph nodes, and bilateral cardiophrenic lymph nodes. Reference subcarinal lymph node measures 2.8 cm in short axis on image 100 with an SUV max of 13.5. Reference right lower paratracheal lymph node measures 1.7 cm in short axis  on image 86 with an SUV max of 12.5. Reference left supraclavicular lymph node measures 9 mm in short axis on image 58 with an SUV max of 6.1. Right cardiophrenic lymph node measuring 7 mm in short axis on image 119 with an SUV max of 3.1. Incidental CT findings: Three-vessel coronary artery calcifications. Atherosclerotic disease of the thoracic aorta. Severe upper lobe predominant centrilobular emphysema. Ground-glass opacity of the right middle lobe with mild FDG uptake, likely scarring related to prior infection. ABDOMEN/PELVIS: No abnormal hypermetabolic activity within the liver, pancreas, adrenal glands, or spleen. No hypermetabolic lymph nodes in the abdomen or pelvis. Incidental CT findings: Cholecystectomy clips diverticulosis of the descending and sigmoid colon. Atherosclerotic disease of the abdominal aorta. SKELETON: No focal hypermetabolic activity to suggest skeletal metastasis. Incidental CT findings: none IMPRESSION: Large hypermetabolic left lower lobe lung mass with adjacent hypermetabolic ground-glass opacities which possibly represent postobstructive change versus lymphangitic carcinomatosis. Enlarged and hypermetabolic mediastinal, left supraclavicular lymph nodes, and bilateral cardiophrenic lymph nodes, compatible with metastatic disease. No evidence of metastatic disease in the abdomen or pelvis. Aortic Atherosclerosis (ICD10-I70.0) and Emphysema (ICD10-J43.9). Electronically Signed   By: Yetta Glassman M.D.   On: 11/20/2020 16:26   DG Chest Port 1 View  Result Date: 11/27/2020 CLINICAL DATA:  Sepsis, increasing shortness of breath, left lung mass EXAM: PORTABLE CHEST 1 VIEW COMPARISON:  11/03/2020, 11/19/2020 FINDINGS: 2 frontal views of the chest demonstrate a stable cardiac silhouette. The masslike left lower lobe consolidation seen on prior PET scan is again identified unchanged, concerning for malignancy based on previous PET scan findings. The adjacent interstitial and  ground-glass opacities throughout the left lung could reflect lymphangitic spread of disease. Stable background scarring elsewhere throughout the lungs. No new consolidation, effusion, or pneumothorax. No acute bony abnormality. IMPRESSION: 1. Stable masslike left lower lobe consolidation concerning for malignancy based on previous PET scan findings. Interstitial opacities throughout the left lower lobe could reflect lymphangitic spread of disease. 2. Otherwise stable background scarring without new airspace disease or effusion. Electronically Signed   By: Randa Ngo M.D.   On: 11/27/2020 20:04   DG Chest Portable 1 View  Result Date: 11/03/2020 CLINICAL DATA:  Short of breath, COPD EXAM: PORTABLE CHEST 1 VIEW COMPARISON:  10/27/2020 FINDINGS: Single frontal view of the chest demonstrates stable masslike consolidation at the left hilum. There is increasing consolidation within the left lung, with underlying volume loss. Background emphysema is again noted. Developing consolidation at the right lung base may be hypoventilatory. No large effusion or pneumothorax. IMPRESSION: 1. Persistent masslike consolidation at the left hilum, concerning for malignancy based on prior CT 10/27/2020. 2. Increasing left lung consolidation, which could reflect postobstructive change.  3. Developing right basilar consolidation, favor atelectasis. 4. Stable background emphysema. Electronically Signed   By: Randa Ngo M.D.   On: 11/03/2020 18:15      ASSESSMENT/PLAN   Mild acute exacerbation of COPD with hypoxemia and hypercapnia -Agree with Unasyn and prednisone combination with typical COPD carepath utilizing Duoneb    -Left lung mass with hilar lymphadenopathy 4.9cm LLL mass  - plan for tissue diagnosis in AM  - Navigational bronchoscopy and EBUS planned for 1230pm  - patient has not had anticoagulation nor antiplatelets  - patient is NPO  - patient is stable and wishes to proceed with surgical  plan    Thank you for allowing me to participate in the care of this patient.  Total face to face encounter time for this patient visit was >45 min. >50% of the time was  spent in counseling and coordination of care.   Patient/Family are satisfied with care plan and all questions have been answered.  This document was prepared using Dragon voice recognition software and may include unintentional dictation errors.     Ottie Glazier, M.D.  Division of [place]

## 2020-11-30 NOTE — Progress Notes (Signed)
PROGRESS NOTE    Chris Lozano  HWE:[id number] DOB: [birth year] DOA: 11/27/2020 PCP: Adin Hector, MD   Assessment & Plan:   Active Problems:   Mass of lower lobe of left lung   Acute respiratory failure (HCC)  Acute hypoxic and hypercarbic respiratory failure: secondary to COPD exacerbation superimposed on bacterial postobstructive pneumonia secondary to left lower lung mass. Continue on IV unasyn, steroids, bronchodilators & supplemental oxygen. Encourage incentive spirometry. COVID19 and influenza are both neg   Lung mass: will go for biopsy tomorrow as per pulmon. NPO after midnight   Post obstructive pneumonia: likely secondary to lung mass. Continue on IV unasyn, bronchodilators & supplemental oxygen   Sepsis: met criteria w/ tachypnea, tachycardia and likely postobstructive pneumonia.Sepsis resolved  Hyperkalemia: resolved  Unlikely bacteremia: blood cxs growing stap epi. Likely containment. Repeat blood cxs NGTD  HTN: continue on home dose of coreg   Hypothyroidism: continue on home dose of levothyroxine   HLD: continue on statin   Hx of CAD: continue on home dose of statin, coreg & aspirin    DVT prophylaxis: lovenox  Code Status: full  Family Communication: discussed pt's care w/ pt's daughter, [name], and answered her questions  Disposition Plan: depends on PT/OT recs (not consulted yet)   Level of care: Progressive Cardiac  Status is: Inpatient  Remains inpatient appropriate because: severity of illness     Consultants:  Onco   Procedures:   Antimicrobials:    Subjective: Pt c/o cough   Objective: Vitals:   11/29/20 2347 11/30/20 0058 11/30/20 0440 11/30/20 0753  BP: (!) 104/91  120/75 126/77  Pulse: 72 66 66 70  Resp: 18  18 19   Temp: 97.7 F (36.5 C)  97.7 F (36.5 C) 97.8 F (36.6 C)  TempSrc:    Oral  SpO2: 90% 95% (!) 87% 95%  Weight:      Height:        Intake/Output Summary (Last 24 hours) at 11/30/2020 0824 Last data  filed at 11/30/2020 0810 Gross per 24 hour  Intake 960 ml  Output 2200 ml  Net -1240 ml   Filed Weights   11/29/20 0310  Weight: 71.2 kg    Examination:  General exam: Appears calm & comfortable  Respiratory system: course breath sounds b/l  Cardiovascular system: S1 & S2+. No clicks or rubs  Gastrointestinal system: Abd is soft, NT, ND & normal bowel sounds  Central nervous system: Alert and oriented. Moves all extremities   Psychiatry: Judgement and insight appear normal. Flat mood and affect     Data Reviewed: I have personally reviewed following labs and imaging studies  CBC: Recent Labs  Lab 11/27/20 1944 11/28/20 0616 11/29/20 0400 11/30/20 0419  WBC 12.0* 8.2 13.6* 12.0*  NEUTROABS 8.8*  --   --   --   HGB 13.5 12.9* 11.7* 12.3*  HCT 42.5 41.3 36.6* 36.8*  MCV 95.1 95.6 92.7 94.6  PLT 334 236 268 893   Basic Metabolic Panel: Recent Labs  Lab 11/27/20 1944 11/28/20 0616 11/28/20 1730 11/29/20 0400 11/30/20 0419  NA 138 139  --  139 141  K 5.0 5.5* 4.1 4.2 3.8  CL 98 102  --  98 102  CO2 33* 33*  --  32 33*  GLUCOSE 161* 168*  --  129* 103*  BUN 20 21  --  24* 23  CREATININE 1.00 0.79  --  0.78 0.71  CALCIUM 8.4* 8.2*  --  7.9* 8.0*  GFR: Estimated Creatinine Clearance: 65.4 mL/min (by C-G formula based on SCr of 0.71 mg/dL). Liver Function Tests: Recent Labs  Lab 11/27/20 1944  AST 32  ALT 29  ALKPHOS 90  BILITOT 0.8  PROT 6.9  ALBUMIN 3.1*   No results for input(s): LIPASE, AMYLASE in the last 168 hours. No results for input(s): AMMONIA in the last 168 hours. Coagulation Profile: No results for input(s): INR, PROTIME in the last 168 hours. Cardiac Enzymes: No results for input(s): CKTOTAL, CKMB, CKMBINDEX, TROPONINI in the last 168 hours. BNP (last 3 results) No results for input(s): PROBNP in the last 8760 hours. HbA1C: No results for input(s): HGBA1C in the last 72 hours. CBG: No results for input(s): GLUCAP in the last 168  hours. Lipid Profile: No results for input(s): CHOL, HDL, LDLCALC, TRIG, CHOLHDL, LDLDIRECT in the last 72 hours. Thyroid Function Tests: No results for input(s): TSH, T4TOTAL, FREET4, T3FREE, THYROIDAB in the last 72 hours. Anemia Panel: No results for input(s): VITAMINB12, FOLATE, FERRITIN, TIBC, IRON, RETICCTPCT in the last 72 hours. Sepsis Labs: Recent Labs  Lab 11/27/20 1946 11/28/20 0616  LATICACIDVEN 1.2 1.3    Recent Results (from the past 240 hour(s))  Blood Culture (routine x 2)     Status: None (Preliminary result)   Collection Time: 11/27/20  7:44 PM   Specimen: BLOOD  Result Value Ref Range Status   Specimen Description BLOOD BLOOD LEFT FOREARM  Final   Special Requests   Final    BOTTLES DRAWN AEROBIC AND ANAEROBIC Blood Culture adequate volume   Culture   Final    NO GROWTH 3 DAYS Performed at [facility], [address] [place], [place] [address]    Report Status PENDING  Incomplete  Blood Culture (routine x 2)     Status: None (Preliminary result)   Collection Time: 11/27/20  7:44 PM   Specimen: BLOOD  Result Value Ref Range Status   Specimen Description   Final    BLOOD RIGHT ANTECUBITAL Performed at [facility], [address] [place], [place] [address]    Special Requests   Final    BOTTLES DRAWN AEROBIC AND ANAEROBIC Blood Culture adequate volume Performed at [facility], [address], [place], [place] [address]    Culture  Setup Time   Final    Organism ID to follow [place] CRITICAL RESULT CALLED TO, READ BACK BY AND VERIFIED WITH: SUSAN WATSON 11/28/20 [place] Performed at [facility], [address], [place], [place] [address]    Culture   Final    Lonell Grandchild POSITIVE COCCI TOO YOUNG TO READ Performed at [facility], [place] [address] [place], [place] [address]    Report Status PENDING  Incomplete  Resp Panel by RT-PCR (Flu A&B, Covid) Nasopharyngeal Swab      Status: None   Collection Time: 11/27/20  7:44 PM   Specimen: Nasopharyngeal Swab; Nasopharyngeal(NP) swabs in vial transport medium  Result Value Ref Range Status   SARS Coronavirus 2 by RT PCR NEGATIVE NEGATIVE Final    Comment: (NOTE) SARS-CoV-2 target nucleic acids are NOT DETECTED.  The SARS-CoV-2 RNA is generally detectable in upper respiratory specimens during the acute phase of infection. The lowest concentration of SARS-CoV-2 viral copies this assay can detect is 138 copies/mL. A negative result does not preclude SARS-Cov-2 infection and should not be used as the sole basis for treatment or other patient management decisions. A negative result may  occur with  improper specimen collection/handling, submission of specimen other than nasopharyngeal swab, presence of viral mutation(s) within the areas targeted by this assay, and inadequate number of viral copies(<138 copies/mL). A negative result must be combined with clinical observations, patient history, and epidemiological information. The expected result is Negative.  Fact Sheet for Patients:  EntrepreneurPulse.com.au  Fact Sheet for Healthcare Providers:  IncredibleEmployment.be  This test is no t yet approved or cleared by the [place] FDA and  has been authorized for detection and/or diagnosis of SARS-CoV-2 by FDA under an Emergency Use Authorization (EUA). This EUA will remain  in effect (meaning this test can be used) for the duration of the COVID-19 declaration under Section 564(b)(1) of the Act, 21 U.S.C.section 360bbb-3(b)(1), unless the authorization is terminated  or revoked sooner.       Influenza A by PCR NEGATIVE NEGATIVE Final   Influenza B by PCR NEGATIVE NEGATIVE Final    Comment: (NOTE) The Xpert Xpress SARS-CoV-2/FLU/RSV plus assay is intended as an aid in the diagnosis of influenza from Nasopharyngeal swab specimens and should not be used as a sole basis  for treatment. Nasal washings and aspirates are unacceptable for Xpert Xpress SARS-CoV-2/FLU/RSV testing.  Fact Sheet for Patients: EntrepreneurPulse.com.au  Fact Sheet for Healthcare Providers: IncredibleEmployment.be  This test is not yet approved or cleared by the [place] FDA and has been authorized for detection and/or diagnosis of SARS-CoV-2 by FDA under an Emergency Use Authorization (EUA). This EUA will remain in effect (meaning this test can be used) for the duration of the COVID-19 declaration under Section 564(b)(1) of the Act, 21 U.S.C. section 360bbb-3(b)(1), unless the authorization is terminated or revoked.  Performed at [facility], [address] [place], [place] [address]   Blood Culture ID Panel (Reflexed)     Status: Abnormal   Collection Time: 11/27/20  7:44 PM  Result Value Ref Range Status   Enterococcus faecalis NOT DETECTED NOT DETECTED Final   Enterococcus Faecium NOT DETECTED NOT DETECTED Final   Listeria monocytogenes NOT DETECTED NOT DETECTED Final   Staphylococcus species DETECTED (A) NOT DETECTED Final    Comment: CRITICAL RESULT CALLED TO, READ BACK BY AND VERIFIED WITH: SUSAN WATSON 11/28/20 1526 KLW    Staphylococcus aureus (BCID) NOT DETECTED NOT DETECTED Final   Staphylococcus epidermidis DETECTED (A) NOT DETECTED Final    Comment: Methicillin (oxacillin) resistant coagulase negative staphylococcus. Possible blood culture contaminant (unless isolated from more than one blood culture draw or clinical case suggests pathogenicity). No antibiotic treatment is indicated for blood  culture contaminants. CRITICAL RESULT CALLED TO, READ BACK BY AND VERIFIED WITH: SUSAN WATSON 11/28/20 1526 KLW    Staphylococcus lugdunensis NOT DETECTED NOT DETECTED Final   Streptococcus species NOT DETECTED NOT DETECTED Final   Streptococcus agalactiae NOT DETECTED NOT DETECTED Final   Streptococcus pneumoniae NOT  DETECTED NOT DETECTED Final   Streptococcus pyogenes NOT DETECTED NOT DETECTED Final   A.calcoaceticus-baumannii NOT DETECTED NOT DETECTED Final   Bacteroides fragilis NOT DETECTED NOT DETECTED Final   Enterobacterales NOT DETECTED NOT DETECTED Final   Enterobacter cloacae complex NOT DETECTED NOT DETECTED Final   Escherichia coli NOT DETECTED NOT DETECTED Final   Klebsiella aerogenes NOT DETECTED NOT DETECTED Final   Klebsiella oxytoca NOT DETECTED NOT DETECTED Final   Klebsiella pneumoniae NOT DETECTED NOT DETECTED Final   Proteus species NOT DETECTED NOT DETECTED Final   Salmonella species NOT DETECTED NOT DETECTED Final   Serratia marcescens NOT DETECTED NOT DETECTED  Final   Haemophilus influenzae NOT DETECTED NOT DETECTED Final   Neisseria meningitidis NOT DETECTED NOT DETECTED Final   Pseudomonas aeruginosa NOT DETECTED NOT DETECTED Final   Stenotrophomonas maltophilia NOT DETECTED NOT DETECTED Final   Candida albicans NOT DETECTED NOT DETECTED Final   Candida auris NOT DETECTED NOT DETECTED Final   Candida glabrata NOT DETECTED NOT DETECTED Final   Candida krusei NOT DETECTED NOT DETECTED Final   Candida parapsilosis NOT DETECTED NOT DETECTED Final   Candida tropicalis NOT DETECTED NOT DETECTED Final   Cryptococcus neoformans/gattii NOT DETECTED NOT DETECTED Final   Methicillin resistance mecA/C DETECTED (A) NOT DETECTED Final    Comment: CRITICAL RESULT CALLED TO, READ BACK BY AND VERIFIED WITH: SUSAN WATSON 11/28/20 [place] [facility], [address] [place], [place] [address]   MRSA Next Gen by PCR, Nasal     Status: None   Collection Time: 11/28/20  7:49 PM   Specimen: Nasal Mucosa; Nasal Swab  Result Value Ref Range Status   MRSA by PCR Next Gen NOT DETECTED NOT DETECTED Final    Comment: (NOTE) The GeneXpert MRSA Assay (FDA approved for NASAL specimens only), is one component of a comprehensive MRSA colonization surveillance program. It is  not intended to diagnose MRSA infection nor to guide or monitor treatment for MRSA infections. Test performance is not FDA approved in patients less than 73 years old. Performed at [facility], [address] [place], [place] [address]   CULTURE, BLOOD (ROUTINE X 2) w Reflex to ID Panel     Status: None (Preliminary result)   Collection Time: 11/29/20  8:32 AM   Specimen: BLOOD LEFT HAND  Result Value Ref Range Status   Specimen Description BLOOD LEFT HAND  Final   Special Requests   Final    BOTTLES DRAWN AEROBIC AND ANAEROBIC Blood Culture adequate volume   Culture   Final    NO GROWTH < 24 HOURS Performed at [facility], [address], [place], [place] [address]    Report Status PENDING  Incomplete  CULTURE, BLOOD (ROUTINE X 2) w Reflex to ID Panel     Status: None (Preliminary result)   Collection Time: 11/29/20  8:32 AM   Specimen: BLOOD RIGHT HAND  Result Value Ref Range Status   Specimen Description BLOOD RIGHT HAND  Final   Special Requests   Final    BOTTLES DRAWN AEROBIC AND ANAEROBIC Blood Culture results may not be optimal due to an excessive volume of blood received in culture bottles   Culture   Final    NO GROWTH < 24 HOURS Performed at [facility], [address], [place], [place] [address]    Report Status PENDING  Incomplete         Radiology Studies: No results found.      Scheduled Meds:  ascorbic acid  500 mg Oral q AM   aspirin EC  81 mg Oral Daily   atorvastatin  80 mg Oral Daily   carvedilol  3.125 mg Oral BID   cholecalciferol  1,000 Units Oral Daily   enoxaparin (LOVENOX) injection  40 mg Subcutaneous Q24H   guaiFENesin  600 mg Oral BID   ipratropium-albuterol  3 mL Nebulization TID   levothyroxine  25 mcg Oral QAC breakfast   multivitamin with minerals  1 tablet Oral q AM   predniSONE  40 mg Oral Q breakfast   sodium zirconium cyclosilicate  10 g Oral Once  Continuous Infusions:   ampicillin-sulbactam (UNASYN) IV 3 g (11/30/20 0349)     LOS: 3 days    Time spent: 25 mins     Wyvonnia Dusky, MD [facility] Pager [phone]  If 7PM-7AM, please contact night-coverage 11/30/2020, 8:24 AM

## 2020-12-01 ENCOUNTER — Inpatient Hospital Stay: Payer: Medicare Other

## 2020-12-01 ENCOUNTER — Encounter
Admission: EM | Disposition: A | Discharge status: Home / Self Care | Payer: Self-pay | Source: Home / Self Care | Attending: Internal Medicine

## 2020-12-01 ENCOUNTER — Inpatient Hospital Stay: Payer: Medicare Other | Admitting: Registered Nurse

## 2020-12-01 ENCOUNTER — Encounter: Payer: Self-pay | Admitting: Family Medicine

## 2020-12-01 ENCOUNTER — Ambulatory Visit: Admission: RE | Admit: 2020-12-01 | Payer: Medicare Other | Source: Home / Self Care

## 2020-12-01 DIAGNOSIS — R918 Other nonspecific abnormal finding of lung field: Secondary | ICD-10-CM | POA: Diagnosis not present

## 2020-12-01 DIAGNOSIS — J9602 Acute respiratory failure with hypercapnia: Secondary | ICD-10-CM | POA: Diagnosis not present

## 2020-12-01 DIAGNOSIS — J9601 Acute respiratory failure with hypoxia: Secondary | ICD-10-CM | POA: Diagnosis not present

## 2020-12-01 DIAGNOSIS — J189 Pneumonia, unspecified organism: Secondary | ICD-10-CM | POA: Diagnosis not present

## 2020-12-01 HISTORY — PX: VIDEO BRONCHOSCOPY WITH ENDOBRONCHIAL NAVIGATION: SHX6175

## 2020-12-01 HISTORY — PX: VIDEO BRONCHOSCOPY WITH ENDOBRONCHIAL ULTRASOUND: SHX6177

## 2020-12-01 LAB — CBC
HCT: 38.1 % — ABNORMAL LOW (ref 39.0–52.0)
Hemoglobin: 12.4 g/dL — ABNORMAL LOW (ref 13.0–17.0)
MCH: 31.2 pg (ref 26.0–34.0)
MCHC: 32.5 g/dL (ref 30.0–36.0)
MCV: 96 fL (ref 80.0–100.0)
Platelets: 219 10*3/uL (ref 150–400)
RBC: 3.97 MIL/uL — ABNORMAL LOW (ref 4.22–5.81)
RDW: 15 % (ref 11.5–15.5)
WBC: 11.2 10*3/uL — ABNORMAL HIGH (ref 4.0–10.5)
nRBC: 0.2 % (ref 0.0–0.2)

## 2020-12-01 LAB — PROTIME-INR
INR: 1.2 (ref 0.8–1.2)
Prothrombin Time: 14.8 seconds (ref 11.4–15.2)

## 2020-12-01 LAB — BASIC METABOLIC PANEL
Anion gap: 6 (ref 5–15)
BUN: 15 mg/dL (ref 8–23)
CO2: 31 mmol/L (ref 22–32)
Calcium: 8 mg/dL — ABNORMAL LOW (ref 8.9–10.3)
Chloride: 102 mmol/L (ref 98–111)
Creatinine, Ser: 0.88 mg/dL (ref 0.61–1.24)
GFR, Estimated: >60 mL/min (ref >60)
Glucose, Bld: 83 mg/dL (ref 70–99)
Potassium: 4.1 mmol/L (ref 3.5–5.1)
Sodium: 139 mmol/L (ref 135–145)

## 2020-12-01 SURGERY — VIDEO BRONCHOSCOPY WITH ENDOBRONCHIAL NAVIGATION
Anesthesia: General | Laterality: Bilateral

## 2020-12-01 SURGERY — BRONCHOSCOPY, WITH EBUS
Anesthesia: General

## 2020-12-01 MED ORDER — PHENYLEPHRINE HCL 0.25 % NA SOLN
1.0000 | Freq: Four times a day (QID) | NASAL | Status: DC | PRN
  Filled 2020-12-01: qty 15

## 2020-12-01 MED ORDER — FENTANYL CITRATE (PF) 100 MCG/2ML IJ SOLN
INTRAMUSCULAR | Status: DC | PRN
  Administered 2020-12-01: 50 ug via INTRAVENOUS

## 2020-12-01 MED ORDER — SODIUM CHLORIDE 0.9 % IV SOLN: INTRAVENOUS | Status: DC | PRN

## 2020-12-01 MED ORDER — CHLORHEXIDINE GLUCONATE 0.12 % MT SOLN
OROMUCOSAL | Status: AC
  Filled 2020-12-01: qty 15

## 2020-12-01 MED ORDER — BUTAMBEN-TETRACAINE-BENZOCAINE 2-2-14 % EX AERO
1.0000 | INHALATION_SPRAY | Freq: Once | CUTANEOUS | Status: DC
  Filled 2020-12-01: qty 20

## 2020-12-01 MED ORDER — PROPOFOL 10 MG/ML IV BOLUS
INTRAVENOUS | Status: DC | PRN
  Administered 2020-12-01: 80 mg via INTRAVENOUS

## 2020-12-01 MED ORDER — LIDOCAINE HCL URETHRAL/MUCOSAL 2 % EX GEL
1.0000 "application " | Freq: Once | CUTANEOUS | Status: DC
  Filled 2020-12-01: qty 5

## 2020-12-01 MED ORDER — LACTATED RINGERS IV SOLN: INTRAVENOUS | Status: DC | PRN

## 2020-12-01 MED ORDER — PHENYLEPHRINE HCL (PRESSORS) 10 MG/ML IV SOLN
INTRAVENOUS | Status: DC | PRN
  Administered 2020-12-01 (×2): 80 ug via INTRAVENOUS

## 2020-12-01 MED ORDER — ONDANSETRON HCL 4 MG/2ML IJ SOLN
INTRAMUSCULAR | Status: DC | PRN
  Administered 2020-12-01: 4 mg via INTRAVENOUS

## 2020-12-01 MED ORDER — ROCURONIUM BROMIDE 100 MG/10ML IV SOLN
INTRAVENOUS | Status: DC | PRN
  Administered 2020-12-01: 50 mg via INTRAVENOUS
  Administered 2020-12-01: 10 mg via INTRAVENOUS

## 2020-12-01 MED ORDER — SUGAMMADEX SODIUM 200 MG/2ML IV SOLN
INTRAVENOUS | Status: DC | PRN
  Administered 2020-12-01: 200 mg via INTRAVENOUS

## 2020-12-01 MED ORDER — LIDOCAINE HCL (CARDIAC) PF 100 MG/5ML IV SOSY
PREFILLED_SYRINGE | INTRAVENOUS | Status: DC | PRN
  Administered 2020-12-01: 80 mg via INTRAVENOUS

## 2020-12-01 MED ORDER — PHENYLEPHRINE HCL-NACL 20-0.9 MG/250ML-% IV SOLN
INTRAVENOUS | Status: DC | PRN
  Administered 2020-12-01: 30 ug/min via INTRAVENOUS

## 2020-12-01 NOTE — Care Management Important Message (Signed)
Important Message  Patient Details  Name: Chris Lozano MRN: [id number] Date of Birth: 1941-02-06   Medicare Important Message Given:  Yes  Patient out of room upon time of visit.  Copy of Medicare IM left in room for reference.   Dannette Barbara 12/01/2020, 4:10 PM

## 2020-12-01 NOTE — Progress Notes (Signed)
PROGRESS NOTE    Chris Lozano  HKV:[id number] DOB: [birth year] DOA: 11/27/2020 PCP: Adin Hector, MD   Assessment & Plan:   Active Problems:   Mass of lower lobe of left lung   Acute respiratory failure (HCC)  Acute hypoxic and hypercarbic respiratory failure: secondary to COPD exacerbation superimposed on bacterial postobstructive pneumonia secondary to left lower lung mass. Continue on IV unasyn, steroids, bronchodilators & supplemental oxygen. Encourage incentive spirometry. COVID19 and influenza are both neg   Lung mass: lung mass biopsy today as per pulmon. Pulmon recs apprec   Post obstructive pneumonia: likely secondary to lung mass. Continue on IV unasyn, bronchodilators & supplemental oxygen   Sepsis: met criteria w/ tachypnea, tachycardia and likely postobstructive pneumonia. Sepsis resolved  Hyperkalemia: resolved  Unlikely bacteremia: blood cxs growing stap epi. Likely containment. Repeat blood cxs NGTD  HTN: continue on home dose of coreg  Hypothyroidism: continue on home dose of levothyroxine   HLD: continue on statin   Hx of CAD: continue on coreg, statin & aspirin    DVT prophylaxis: lovenox  Code Status: full  Family Communication: discussed pt's care w/ pt's daughter, [name], and answered her questions  Disposition Plan: likely d/c back home   Level of care: Progressive Cardiac  Status is: Inpatient  Remains inpatient appropriate because: severity of illness     Consultants:  Onco   Procedures:   Antimicrobials:    Subjective: Pt c/o fatigue    Objective: Vitals:   11/30/20 1954 11/30/20 2128 12/01/20 0007 12/01/20 0355  BP: 122/74  98/61 111/69  Pulse: 75 79 63 67  Resp: 18  18 18   Temp: 97.7 F (36.5 C)  97.7 F (36.5 C) (!) 97.4 F (36.3 C)  TempSrc:      SpO2: 100% 94% 99% 97%  Weight:      Height:        Intake/Output Summary (Last 24 hours) at 12/01/2020 0757 Last data filed at 12/01/2020 0700 Gross per 24 hour   Intake 1431.62 ml  Output 1900 ml  Net -468.38 ml   Filed Weights   11/29/20 0310  Weight: 71.2 kg    Examination:  General exam: Appears comfortable  Respiratory system: diminished breath sounds b/l  Cardiovascular system: S1/S2+. No rubs or gallops   Gastrointestinal system: Abd is soft, NT, ND & normal bowel sounds   Central nervous system: alert and oriented. Moves all extremities  Psychiatry: Judgement and insight appear normal. Flat mood and affect      Data Reviewed: I have personally reviewed following labs and imaging studies  CBC: Recent Labs  Lab 11/27/20 1944 11/28/20 0616 11/29/20 0400 11/30/20 0419 12/01/20 0526  WBC 12.0* 8.2 13.6* 12.0* 11.2*  NEUTROABS 8.8*  --   --   --   --   HGB 13.5 12.9* 11.7* 12.3* 12.4*  HCT 42.5 41.3 36.6* 36.8* 38.1*  MCV 95.1 95.6 92.7 94.6 96.0  PLT 334 236 268 234 564   Basic Metabolic Panel: Recent Labs  Lab 11/27/20 1944 11/28/20 0616 11/28/20 1730 11/29/20 0400 11/30/20 0419 12/01/20 0526  NA 138 139  --  139 141 139  K 5.0 5.5* 4.1 4.2 3.8 4.1  CL 98 102  --  98 102 102  CO2 33* 33*  --  32 33* 31  GLUCOSE 161* 168*  --  129* 103* 83  BUN 20 21  --  24* 23 15  CREATININE 1.00 0.79  --  0.78 0.71 0.88  CALCIUM 8.4* 8.2*  --  7.9* 8.0* 8.0*   GFR: Estimated Creatinine Clearance: 59.4 mL/min (by C-G formula based on SCr of 0.88 mg/dL). Liver Function Tests: Recent Labs  Lab 11/27/20 1944  AST 32  ALT 29  ALKPHOS 90  BILITOT 0.8  PROT 6.9  ALBUMIN 3.1*   No results for input(s): LIPASE, AMYLASE in the last 168 hours. No results for input(s): AMMONIA in the last 168 hours. Coagulation Profile: No results for input(s): INR, PROTIME in the last 168 hours. Cardiac Enzymes: No results for input(s): CKTOTAL, CKMB, CKMBINDEX, TROPONINI in the last 168 hours. BNP (last 3 results) No results for input(s): PROBNP in the last 8760 hours. HbA1C: No results for input(s): HGBA1C in the last 72  hours. CBG: No results for input(s): GLUCAP in the last 168 hours. Lipid Profile: No results for input(s): CHOL, HDL, LDLCALC, TRIG, CHOLHDL, LDLDIRECT in the last 72 hours. Thyroid Function Tests: No results for input(s): TSH, T4TOTAL, FREET4, T3FREE, THYROIDAB in the last 72 hours. Anemia Panel: No results for input(s): VITAMINB12, FOLATE, FERRITIN, TIBC, IRON, RETICCTPCT in the last 72 hours. Sepsis Labs: Recent Labs  Lab 11/27/20 1946 11/28/20 0616  LATICACIDVEN 1.2 1.3    Recent Results (from the past 240 hour(s))  Blood Culture (routine x 2)     Status: None (Preliminary result)   Collection Time: 11/27/20  7:44 PM   Specimen: BLOOD  Result Value Ref Range Status   Specimen Description BLOOD BLOOD LEFT FOREARM  Final   Special Requests   Final    BOTTLES DRAWN AEROBIC AND ANAEROBIC Blood Culture adequate volume   Culture   Final    NO GROWTH 4 DAYS Performed at [facility], [address] [place], [place] [address]    Report Status PENDING  Incomplete  Blood Culture (routine x 2)     Status: Abnormal   Collection Time: 11/27/20  7:44 PM   Specimen: BLOOD  Result Value Ref Range Status   Specimen Description   Final    BLOOD RIGHT ANTECUBITAL Performed at [facility], [address] [place], [place] [address]    Special Requests   Final    BOTTLES DRAWN AEROBIC AND ANAEROBIC Blood Culture adequate volume Performed at [facility], [address] [place], [place] [address]    Culture  Setup Time   Final    GRAM POSITIVE COCCI AEROBIC BOTTLE ONLY CRITICAL RESULT CALLED TO, READ BACK BY AND VERIFIED WITH: SUSAN WATSON 11/28/20 1526 KLW    Culture (A)  Final    STAPHYLOCOCCUS EPIDERMIDIS THE SIGNIFICANCE OF ISOLATING THIS ORGANISM FROM A SINGLE SET OF BLOOD CULTURES WHEN MULTIPLE SETS ARE DRAWN IS UNCERTAIN. PLEASE NOTIFY THE MICROBIOLOGY DEPARTMENT WITHIN ONE WEEK IF SPECIATION AND SENSITIVITIES ARE REQUIRED. Performed at [facility], [place] [address], [place], [place] [address]    Report Status 11/30/2020 FINAL  Final  Resp Panel by RT-PCR (Flu A&B, Covid) Nasopharyngeal Swab     Status: None   Collection Time: 11/27/20  7:44 PM   Specimen: Nasopharyngeal Swab; Nasopharyngeal(NP) swabs in vial transport medium  Result Value Ref Range Status   SARS Coronavirus 2 by RT PCR NEGATIVE NEGATIVE Final    Comment: (NOTE) SARS-CoV-2 target nucleic acids are NOT DETECTED.  The SARS-CoV-2 RNA is generally detectable in upper respiratory specimens during the acute phase of infection. The lowest concentration of SARS-CoV-2 viral copies this assay can detect is 138 copies/mL. A negative result does not  preclude SARS-Cov-2 infection and should not be used as the sole basis for treatment or other patient management decisions. A negative result may occur with  improper specimen collection/handling, submission of specimen other than nasopharyngeal swab, presence of viral mutation(s) within the areas targeted by this assay, and inadequate number of viral copies(<138 copies/mL). A negative result must be combined with clinical observations, patient history, and epidemiological information. The expected result is Negative.  Fact Sheet for Patients:  EntrepreneurPulse.com.au  Fact Sheet for Healthcare Providers:  IncredibleEmployment.be  This test is no t yet approved or cleared by the [place] FDA and  has been authorized for detection and/or diagnosis of SARS-CoV-2 by FDA under an Emergency Use Authorization (EUA). This EUA will remain  in effect (meaning this test can be used) for the duration of the COVID-19 declaration under Section 564(b)(1) of the Act, 21 U.S.C.section 360bbb-3(b)(1), unless the authorization is terminated  or revoked sooner.       Influenza A by PCR NEGATIVE NEGATIVE Final   Influenza B by PCR NEGATIVE NEGATIVE Final    Comment: (NOTE) The Xpert  Xpress SARS-CoV-2/FLU/RSV plus assay is intended as an aid in the diagnosis of influenza from Nasopharyngeal swab specimens and should not be used as a sole basis for treatment. Nasal washings and aspirates are unacceptable for Xpert Xpress SARS-CoV-2/FLU/RSV testing.  Fact Sheet for Patients: EntrepreneurPulse.com.au  Fact Sheet for Healthcare Providers: IncredibleEmployment.be  This test is not yet approved or cleared by the [place] FDA and has been authorized for detection and/or diagnosis of SARS-CoV-2 by FDA under an Emergency Use Authorization (EUA). This EUA will remain in effect (meaning this test can be used) for the duration of the COVID-19 declaration under Section 564(b)(1) of the Act, 21 U.S.C. section 360bbb-3(b)(1), unless the authorization is terminated or revoked.  Performed at [facility], [address] [place], [place] [address]   Blood Culture ID Panel (Reflexed)     Status: Abnormal   Collection Time: 11/27/20  7:44 PM  Result Value Ref Range Status   Enterococcus faecalis NOT DETECTED NOT DETECTED Final   Enterococcus Faecium NOT DETECTED NOT DETECTED Final   Listeria monocytogenes NOT DETECTED NOT DETECTED Final   Staphylococcus species DETECTED (A) NOT DETECTED Final    Comment: CRITICAL RESULT CALLED TO, READ BACK BY AND VERIFIED WITH: SUSAN WATSON 11/28/20 1526 KLW    Staphylococcus aureus (BCID) NOT DETECTED NOT DETECTED Final   Staphylococcus epidermidis DETECTED (A) NOT DETECTED Final    Comment: Methicillin (oxacillin) resistant coagulase negative staphylococcus. Possible blood culture contaminant (unless isolated from more than one blood culture draw or clinical case suggests pathogenicity). No antibiotic treatment is indicated for blood  culture contaminants. CRITICAL RESULT CALLED TO, READ BACK BY AND VERIFIED WITH: SUSAN WATSON 11/28/20 1526 KLW    Staphylococcus lugdunensis NOT DETECTED  NOT DETECTED Final   Streptococcus species NOT DETECTED NOT DETECTED Final   Streptococcus agalactiae NOT DETECTED NOT DETECTED Final   Streptococcus pneumoniae NOT DETECTED NOT DETECTED Final   Streptococcus pyogenes NOT DETECTED NOT DETECTED Final   A.calcoaceticus-baumannii NOT DETECTED NOT DETECTED Final   Bacteroides fragilis NOT DETECTED NOT DETECTED Final   Enterobacterales NOT DETECTED NOT DETECTED Final   Enterobacter cloacae complex NOT DETECTED NOT DETECTED Final   Escherichia coli NOT DETECTED NOT DETECTED Final   Klebsiella aerogenes NOT DETECTED NOT DETECTED Final   Klebsiella oxytoca NOT DETECTED NOT DETECTED Final   Klebsiella pneumoniae NOT DETECTED NOT DETECTED Final   Proteus  species NOT DETECTED NOT DETECTED Final   Salmonella species NOT DETECTED NOT DETECTED Final   Serratia marcescens NOT DETECTED NOT DETECTED Final   Haemophilus influenzae NOT DETECTED NOT DETECTED Final   Neisseria meningitidis NOT DETECTED NOT DETECTED Final   Pseudomonas aeruginosa NOT DETECTED NOT DETECTED Final   Stenotrophomonas maltophilia NOT DETECTED NOT DETECTED Final   Candida albicans NOT DETECTED NOT DETECTED Final   Candida auris NOT DETECTED NOT DETECTED Final   Candida glabrata NOT DETECTED NOT DETECTED Final   Candida krusei NOT DETECTED NOT DETECTED Final   Candida parapsilosis NOT DETECTED NOT DETECTED Final   Candida tropicalis NOT DETECTED NOT DETECTED Final   Cryptococcus neoformans/gattii NOT DETECTED NOT DETECTED Final   Methicillin resistance mecA/C DETECTED (A) NOT DETECTED Final    Comment: CRITICAL RESULT CALLED TO, READ BACK BY AND VERIFIED WITH: SUSAN WATSON 11/28/20 [place] [facility], [address] [place], [place] [address]   MRSA Next Gen by PCR, Nasal     Status: None   Collection Time: 11/28/20  7:49 PM   Specimen: Nasal Mucosa; Nasal Swab  Result Value Ref Range Status   MRSA by PCR Next Gen NOT DETECTED NOT DETECTED Final     Comment: (NOTE) The GeneXpert MRSA Assay (FDA approved for NASAL specimens only), is one component of a comprehensive MRSA colonization surveillance program. It is not intended to diagnose MRSA infection nor to guide or monitor treatment for MRSA infections. Test performance is not FDA approved in patients less than 27 years old. Performed at [facility], [address] [place], [place] [address]   Urine Culture     Status: None   Collection Time: 11/28/20 10:20 PM   Specimen: Urine, Random  Result Value Ref Range Status   Specimen Description   Final    URINE, RANDOM Performed at [facility], [address], [place], [place] [address]    Special Requests   Final    NONE Performed at [facility], [address] [place], [place] [address]    Culture   Final    NO GROWTH Performed at [facility], [place] [address] [place], [place] [address]    Report Status 11/30/2020 FINAL  Final  CULTURE, BLOOD (ROUTINE X 2) w Reflex to ID Panel     Status: None (Preliminary result)   Collection Time: 11/29/20  8:32 AM   Specimen: BLOOD LEFT HAND  Result Value Ref Range Status   Specimen Description BLOOD LEFT HAND  Final   Special Requests   Final    BOTTLES DRAWN AEROBIC AND ANAEROBIC Blood Culture adequate volume   Culture   Final    NO GROWTH 2 DAYS Performed at [facility], [address] [place], [place] [address]    Report Status PENDING  Incomplete  CULTURE, BLOOD (ROUTINE X 2) w Reflex to ID Panel     Status: None (Preliminary result)   Collection Time: 11/29/20  8:32 AM   Specimen: BLOOD RIGHT HAND  Result Value Ref Range Status   Specimen Description BLOOD RIGHT HAND  Final   Special Requests   Final    BOTTLES DRAWN AEROBIC AND ANAEROBIC Blood Culture results may not be optimal due to an excessive volume of blood received in culture bottles   Culture   Final    NO GROWTH 2 DAYS Performed at [facility], [address], [place], [place] [address]    Report  Status PENDING  Incomplete         Radiology Studies: No results found.      Scheduled Meds:  ascorbic acid  500 mg Oral q AM   atorvastatin  80 mg Oral Daily   carvedilol  3.125 mg Oral BID   cholecalciferol  1,000 Units Oral Daily   guaiFENesin  600 mg Oral BID   ipratropium-albuterol  3 mL Nebulization TID   levothyroxine  25 mcg Oral QAC breakfast   multivitamin with minerals  1 tablet Oral q AM   predniSONE  40 mg Oral Q breakfast   sodium zirconium cyclosilicate  10 g Oral Once   Continuous Infusions:  ampicillin-sulbactam (UNASYN) IV Stopped (12/01/20 0335)     LOS: 4 days    Time spent: 15 mins     Wyvonnia Dusky, MD [facility] Pager [phone]  If 7PM-7AM, please contact night-coverage 12/01/2020, 7:57 AM

## 2020-12-01 NOTE — Anesthesia Preprocedure Evaluation (Signed)
Anesthesia Evaluation  Patient identified by MRN, date of birth, ID band Patient awake    Reviewed: Allergy & Precautions, NPO status , Patient's Chart, lab work & pertinent test results  History of Anesthesia Complications Negative for: history of anesthetic complications  Airway Mallampati: II  TM Distance: >3 FB Neck ROM: Full    Dental  (+) Edentulous Upper, Edentulous Lower   Pulmonary COPD,  oxygen dependent, former smoker,    breath sounds clear to auscultation- rhonchi (-) wheezing      Cardiovascular Exercise Tolerance: Poor hypertension, + CAD (nonobstructive)  (-) Past MI, (-) Cardiac Stents and (-) CABG  Rhythm:Regular Rate:Normal - Systolic murmurs and - Diastolic murmurs L heart cath: LM-20% distal LAD-insignificant disease with 40% in D2 LCx-grove circ without significant disease, 40-50% stenosis in om2 RCA-50% mid. EF mildly reduced at 45-50% globally. Aortic root mildly dilated with tortuous dilated descending aorta    Neuro/Psych neg Seizures negative neurological ROS  negative psych ROS   GI/Hepatic Neg liver ROS, GERD  ,  Endo/Other  negative endocrine ROSneg diabetes  Renal/GU negative Renal ROS     Musculoskeletal negative musculoskeletal ROS (+)   Abdominal (+) - obese,   Peds  Hematology negative hematology ROS (+)   Anesthesia Other Findings Past Medical History: No date: Asthma No date: BPH (benign prostatic hyperplasia) No date: CAD (coronary artery disease) No date: COPD (chronic obstructive pulmonary disease) (HCC) No date: GERD (gastroesophageal reflux disease) No date: HLD (hyperlipidemia) No date: Sleep apnea   Reproductive/Obstetrics                            Anesthesia Physical Anesthesia Plan  ASA: 4  Anesthesia Plan: General   Post-op Pain Management:    Induction: Intravenous  PONV Risk Score and Plan: 1 and Ondansetron, Dexamethasone  and Treatment may vary due to age or medical condition  Airway Management Planned: Oral ETT  Additional Equipment:   Intra-op Plan:   Post-operative Plan: Extubation in OR  Informed Consent: I have reviewed the patients History and Physical, chart, labs and discussed the procedure including the risks, benefits and alternatives for the proposed anesthesia with the patient or authorized representative who has indicated his/her understanding and acceptance.   Patient has DNR.  Discussed DNR with patient and Suspend DNR.   Dental advisory given  Plan Discussed with: CRNA and Anesthesiologist  Anesthesia Plan Comments:         Anesthesia Quick Evaluation

## 2020-12-01 NOTE — Procedures (Signed)
ELECTROMAGNETIC NAVIGATIONAL BRONCHOSCOPY PROCEDURE NOTE  FIBEROPTIC BRONCHOSCOPY WITH BRONCHOALVEOLAR LAVAGE AND THERAPEUTIC ASPIRATION OF TRACHEOBRONCHIAL TREE PROCEDURE NOTE  ENDOBRONCHIAL ULTRASOUND PROCEDURE NOTE    Flexible bronchoscopy was performed  by : Lanney Gins MD  assistance by : 1)Repiratory therapist  and 2)[employer] staff and 3) Anesthesia team and 4) Flouroscopy team    Indication for the procedure was :  Pre-procedural H&P. The following assessment was performed on the day of the procedure prior to initiating sedation History:  Chest pain n Dyspnea y Hemoptysis n Cough y Fever n Other pertinent items n  Examination Vital signs -reviewed as per nursing documentation today Cardiac    Murmurs: n  Rubs : n  Gallop: n Lungs Wheezing: n Rales : n Rhonchi :y  Other pertinent findings: SOB/hypoxemia due to chronic lung disease   Pre-procedural assessment for Procedural Sedation included: Depth of sedation: As per anesthesia team  ASA Classification:  2 Mallampati airway assessment: 3    Medication list reviewed: y  The patient's interval history was taken and revealed: no new complaints The pre- procedure physical examination revealed: No new findings Refer to prior clinic note for details.  Informed Consent: Informed consent was obtained from:  patient after explanation of procedure and risks, benefits, as well as alternative procedures available.  Explanation of level of sedation and possible transfusion was also provided.    Procedural Preparation: Time out was performed and patient was identified by name and birthdate and procedure to be performed and side for sampling, if any, was specified. Pt was intubated by anesthesia.  The patient was appropriately draped.   Fiberoptic bronchoscopy with airway inspection and BAL Procedure findings:  Bronchoscope was inserted via ETT  without difficulty.  Posterior oropharynx, epiglottis,  arytenoids, false cords and vocal cords were not visualized as these were bypassed by endotracheal tube. The distal trachea was normal in circumference and appearance without mucosal, cartilaginous or branching abnormalities.  The main carina was mildly splayed . All right and left lobar airways were visualized to the Subsegmental level.  Sub- sub segmental carinae were identified in all the distal airways.   Secretions were visible in the following airways and appeared to be clear.  The mucosa was : Atypical anatomy noted: patient had right upper lobe takeoff prior to main carina as displayed below. There was inspissated phlegm that clogged bronchoscopy and required multiple attempts to remove mucus plugging bilaterally. The mucosa was otherwise normal.  Media Information Document Information  Photos    12/01/2020 13:14  Attached To:  Hospital Encounter on 11/27/20   Source Information  Ottie Glazier, MD  [facility]   Airways were notable for:        exophytic lesions :n       extrinsic compression in the following distributions: n.       Friable mucosa: y       Neurosurgeon /pigmentation: n     Post procedure Diagnosis:   left lower lobe friable mucosa, atypical anatomy, mucus plugging bilaterally      Electromagnetic Navigational Bronchoscopy Procedure Findings:  After appropriate CT-guided planning ENB scope was advanced via endotracheal tube and LG was advanced for registration.  Post appropriate planning and registration peripheral navigation was used to visualize target lesion.    Left lower lobe lesion targeted - cytobrush x 4 done at [facility]                                                   -  surgical biopsy x 8 done at [facility]  Post procedure diagnosis: LUNG CANCER       Endobronchial ultrasound assisted hilar and mediastinal lymph node biopsies procedure findings: The fiberoptic bronchoscope was removed and the EBUS scope was introduced. Examination began to  evaluate for pathologically enlarged lymph nodes starting on the RIGHT  side progressing to the LEFT side.  All lymph node biopsies performed with 22G needle. Lymph node biopsies were sent in cytolite for all stations.  STATION 10R BIOPSIED 3 TIMES - 2.2CM IN SIZE  STATION 7 BIOPSIED 3 TIMES - 2.5CM IN SIZE STATION 4L BIOPSIED 3 TIMES -1.6CM IN SIZE   Post procedure diagnosis:  METASTATIC LUNG CANCER    Specimens obtained included:                   Cytology brushes : [facility]  Broncho-alveolar lavage site:[facility]  sent for CYTOLOGY                             40 ml volume infused 20 ml volume returned with [facility] appearance  Immediate sampling complications included:NONE  Epinephrine NONE ml was used topically  The bronchoscopy was terminated due to completion of the planned procedure and the bronchoscope was removed.   Total dosage of Lidocaine was NONE mg Total fluoroscopy time was 1.1 minutes   Estimated Blood loss: 5 cc.  Complications included:  NONE     Disposition: back to inpatient  Follow up with Dr. Lanney Gins in 5 days for result discussion.     Ottie Glazier MD  [place] Division of Pulmonary & Critical Care Medicine

## 2020-12-01 NOTE — Transfer of Care (Signed)
Immediate Anesthesia Transfer of Care Note  Patient: Chris Lozano  Procedure(s) Performed: VIDEO BRONCHOSCOPY WITH ENDOBRONCHIAL ULTRASOUND VIDEO BRONCHOSCOPY WITH ENDOBRONCHIAL NAVIGATION  Patient Location: PACU  Anesthesia Type:General  Level of Consciousness: awake and alert   Airway & Oxygen Therapy: Patient Spontanous Breathing and Patient connected to face mask oxygen  Post-op Assessment: Report given to RN and Post -op Vital signs reviewed and stable  Post vital signs: Reviewed and stable  Last Vitals:  Vitals Value Taken Time  BP 119/65 12/01/20 1449  Temp    Pulse 71 12/01/20 1455  Resp 18 12/01/20 1455  SpO2 96 % 12/01/20 1455  Vitals shown include unvalidated device data.  Last Pain:  Vitals:   12/01/20 1153  TempSrc: Oral  PainSc: 0-No pain         Complications: No notable events documented.

## 2020-12-01 NOTE — Anesthesia Procedure Notes (Signed)
Procedure Name: Intubation Date/Time: 12/01/2020 12:52 PM Performed by: Hedda Slade, CRNA Pre-anesthesia Checklist: Patient identified, Patient being monitored, Timeout performed, Emergency Drugs available and Suction available Patient Re-evaluated:Patient Re-evaluated prior to induction Oxygen Delivery Method: Circle system utilized Preoxygenation: Pre-oxygenation with 100% oxygen Induction Type: IV induction Ventilation: Mask ventilation without difficulty and Oral airway inserted - appropriate to patient size Laryngoscope Size: McGraph and 4 Grade View: Grade I Tube type: Oral Tube size: 9.0 mm Number of attempts: 1 Airway Equipment and Method: Stylet and Video-laryngoscopy Placement Confirmation: ETT inserted through vocal cords under direct vision, positive ETCO2 and breath sounds checked- equal and bilateral Secured at: 20 cm Tube secured with: Tape Dental Injury: Teeth and Oropharynx as per pre-operative assessment

## 2020-12-01 NOTE — Progress Notes (Signed)
Pulmonary Medicine          Date: 12/01/2020,   MRN# [id number] Chris Lozano [birth year]     AdmissionWeight: 71.2 kg                 CurrentWeight: 71.2 kg   Refferring physician: Dr Jimmye Norman   CHIEF COMPLAINT:   Acute on chronic hypoxemic respiratory failure   HISTORY OF PRESENT ILLNESS   This is a very pleasant 80yo M with hx of Advanced COPD, dyslipidemia, OSA/Asthma overlap syndrome who came in to ER with worsening dyspnea. He has supportive family and daughter who keeps close watch over him. I had evaluated patient 11/21/20 in pulmonology clinic with [facility] and he shared that he was inconsistently compliant with his BIPAP and uses 2L bleed in O2 supplementally at his baseline while resting. He had Mass found on chest CT which was appx 4.9cm and we looked at images together and talked about diagnostic options including bronchoscopy with biopsy which patient wanted to have done asap. 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 He is now here in hospital for acute on chronic hypoxemic respiratory failure. PCCM consultation for further evaluation and management.   12/01/20- patient stable with no overnight events. Family including daughter and wife are at bedside during my evaluation. Plan for bronchoscopy today.   PAST MEDICAL HISTORY   Past Medical History:  Diagnosis Date  . Asthma   . BPH (benign prostatic hyperplasia)   . CAD (coronary artery disease)   . COPD (chronic obstructive pulmonary disease) ([place])   . GERD (gastroesophageal reflux disease)   . HLD (hyperlipidemia)   . Sleep apnea      SURGICAL HISTORY   Past Surgical History:  Procedure Laterality Date  . ADENOIDECTOMY    . CARDIAC CATHETERIZATION    . GALLBLADDER SURGERY    . LEFT HEART CATH AND CORONARY ANGIOGRAPHY Left 01/10/2019   Procedure: LEFT HEART CATH AND CORONARY ANGIOGRAPHY;  Surgeon: Teodoro Spray, MD;  Location: [facility];  Service: Cardiovascular;  Laterality: Left;  . TONSILLECTOMY       FAMILY HISTORY   Family History  Problem Relation Age of Onset  . Heart disease Other   . Kidney disease Neg Hx   . Prostate cancer Neg Hx   . Kidney cancer Neg Hx   . Bladder Cancer Neg Hx      SOCIAL HISTORY   Social History   Tobacco Use  . Smoking status: Former  . Smokeless tobacco: Former    Types: Chew  . Tobacco comments:    as a teenager  Vaping Use  . Vaping Use: Never used  Substance Use Topics  . Alcohol use: Yes    Alcohol/week: 0.0 standard drinks    Comment: occasionally  . Drug use: No     MEDICATIONS    Home Medication:    Current Medication:  Current Facility-Administered Medications:  .  acetaminophen (TYLENOL) tablet 650 mg, 650 mg, Oral, Q6H PRN **OR** acetaminophen (TYLENOL) suppository 650 mg, 650 mg, Rectal, Q6H PRN, Mansy, Jan A, MD .  albuterol (PROVENTIL) (2.5 MG/3ML) 0.083% nebulizer solution 2.5 mg, 2.5 mg, Nebulization, Q6H PRN, Renda Rolls, RPH .  Ampicillin-Sulbactam (UNASYN) 3 g in sodium chloride 0.9 % 100 mL IVPB, 3 g, Intravenous, Q8H, Wyvonnia Dusky, MD, Last Rate: 200 mL/hr at 12/01/20 1103, 3 g at 12/01/20 1103 .  ascorbic acid (VITAMIN C) tablet 500 mg, 500 mg, Oral, q AM, Mansy, Jan A, MD, 500 mg at 12/01/20 1057 .  atorvastatin (LIPITOR) tablet 80 mg, 80 mg, Oral, Daily, Mansy, Jan A, MD, 80 mg at 12/01/20 1056 .  carvedilol (COREG) tablet 3.125 mg, 3.125 mg, Oral, BID, Mansy, Jan A, MD, 3.125 mg at 12/01/20 1057 .  chlorpheniramine-HYDROcodone (TUSSIONEX) 10-8 MG/5ML suspension 5 mL, 5 mL, Oral, Q12H PRN, Mansy, Jan A, MD .  cholecalciferol (VITAMIN D) tablet 1,000 Units, 1,000 Units, Oral, Daily, Mansy, Jan A, MD, 1,000 Units at 12/01/20 1057 .  gabapentin (NEURONTIN) capsule 100 mg, 100 mg, Oral, Daily PRN, Mansy, Jan A, MD .  guaiFENesin (MUCINEX) 12 hr tablet 600 mg, 600 mg, Oral, BID, Mansy, Jan A, MD, 600 mg at 12/01/20 1057 .   ipratropium-albuterol (DUONEB) 0.5-2.5 (3) MG/3ML nebulizer solution 3 mL, 3 mL, Nebulization, TID, Wyvonnia Dusky, MD, 3 mL at 12/01/20 0818 .  levothyroxine (SYNTHROID) tablet 25 mcg, 25 mcg, Oral, QAC breakfast, Mansy, Jan A, MD, 25 mcg at 12/01/20 1056 .  magnesium hydroxide (MILK OF MAGNESIA) suspension 30 mL, 30 mL, Oral, Daily PRN, Mansy, Jan A, MD .  multivitamin with minerals tablet 1 tablet, 1 tablet, Oral, q AM, Mansy, Jan A, MD, 1 tablet at 12/01/20 1056 .  ondansetron (ZOFRAN) tablet 4 mg, 4 mg, Oral, Q6H PRN **OR** ondansetron (ZOFRAN) injection 4 mg, 4 mg, Intravenous, Q6H PRN, Mansy, Jan A, MD .  [COMPLETED] methylPREDNISolone sodium succinate (SOLU-MEDROL) 40 mg/mL injection 40 mg, 40 mg, Intravenous, Q12H, 40 mg at 11/28/20 1308 **FOLLOWED BY** predniSONE (DELTASONE) tablet 40 mg, 40 mg, Oral, Q breakfast, Mansy, Jan A, MD, 40 mg at 12/01/20 1058 .  traZODone (DESYREL) tablet 25 mg, 25 mg, Oral, QHS PRN, Mansy, Arvella Merles, MD    ALLERGIES   Fluocinolone and Ciprofloxacin hcl     REVIEW OF SYSTEMS    Review of Systems:  Gen:  Denies  fever, sweats, chills weigh loss  HEENT: Denies blurred vision, double vision, ear pain, eye pain, hearing loss, nose bleeds, sore throat Cardiac:  No dizziness, chest pain or heaviness, chest tightness,edema Resp:   Denies cough or sputum porduction, shortness of breath,wheezing, hemoptysis,  Gi: Denies swallowing difficulty, stomach pain, nausea or vomiting, diarrhea, constipation, bowel incontinence Gu:  Denies bladder incontinence, burning urine Ext:   Denies Joint pain, stiffness or swelling Skin: Denies  skin rash, easy bruising or bleeding or hives Endoc:  Denies polyuria, polydipsia , polyphagia or weight change Psych:   Denies depression, insomnia or hallucinations   Other:  All other systems negative   VS: BP 124/76 (BP Location: Right Arm)   Pulse 71   Temp (!) 97.5 F (36.4 C)   Resp 17   Ht 5\' 6"  (1.676 m)   Wt 71.2  kg   SpO2 95%   BMI 25.34 kg/m      PHYSICAL EXAM    GENERAL:NAD, no fevers, chills, no weakness no fatigue HEAD: Normocephalic, atraumatic.  EYES: Pupils equal, round, reactive to light. Extraocular muscles intact. No scleral icterus.  MOUTH: Moist mucosal membrane. Dentition intact. No abscess noted.  EAR, NOSE, THROAT: Clear without exudates. No external lesions.  NECK: Supple. No thyromegaly. No nodules. No JVD.  PULMONARY: bilateral mild rhonchi CARDIOVASCULAR: S1 and S2. Regular rate and rhythm. No murmurs, rubs, or gallops. No edema. Pedal pulses 2+ bilaterally.  GASTROINTESTINAL: Soft, nontender, nondistended. No masses. Positive bowel sounds. No hepatosplenomegaly.  MUSCULOSKELETAL: No swelling,  clubbing, or edema. Range of motion full in all extremities.  NEUROLOGIC: Cranial nerves II through XII are intact. No gross focal neurological deficits. Sensation intact. Reflexes intact.  SKIN: No ulceration, lesions, rashes, or cyanosis. Skin warm and dry. Turgor intact.  PSYCHIATRIC: Mood, affect within normal limits. The patient is awake, alert and oriented x 3. Insight, judgment intact.       IMAGING    CT Angio Chest PE W and/or Wo Contrast  Result Date: 11/28/2020 CLINICAL DATA:  Increased shortness of breath, hypoxia, fever EXAM: CT ANGIOGRAPHY CHEST WITH CONTRAST TECHNIQUE: Multidetector CT imaging of the chest was performed using the standard protocol during bolus administration of intravenous contrast. Multiplanar CT image reconstructions and MIPs were obtained to evaluate the vascular anatomy. CONTRAST:  28mL OMNIPAQUE IOHEXOL 350 MG/ML SOLN COMPARISON:  PET-CT dated 11/19/2020 FINDINGS: Cardiovascular: Satisfactory opacification the bilateral pulmonary arteries to the segmental level. No evidence of pulmonary embolism. Although not tailored for evaluation of the thoracic aorta, there is no evidence of thoracic aortic aneurysm or dissection. Atherosclerotic  calcifications of the aortic arch. Heart is normal in size.  No pericardial effusion. Coronary atherosclerosis of the LAD and right coronary artery. Mediastinum/Nodes: 10 mm short axis left supraclavicular node (series 4/image 10), unchanged. Mediastinal lymphadenopathy, including a dominant 2.5 cm short axis subcarinal node (series 4/image 55), similar. Visualized thyroid is unremarkable. Lungs/Pleura: 4.5 x 6.7 cm left lower lobe mass (series 6/image 56), compatible with primary bronchogenic neoplasm when correlating with recent PET. Mild subpleural patchy opacities in the left lower lobe (series 6/image 33), unchanged, possibly post obstructive. New patchy opacities in the central right middle lobe (series 6/image 32), suspicious for pneumonia. Small right pleural effusion. Associated right lower lobe atelectasis. Moderate centrilobular and paraseptal emphysematous changes, upper lung predominant. No pneumothorax. Upper Abdomen: Visualized upper abdomen is notable for prior cholecystectomy and vascular calcifications. Musculoskeletal: Mild degenerative changes of the visualized thoracolumbar spine. Review of the MIP images confirms the above findings. IMPRESSION: No evidence of pulmonary embolism. 6.7 cm left lower lobe mass, compatible with primary bronchogenic neoplasm when correlating with recent CT. Associated thoracic nodal metastases. Mild subpleural patchy opacities in the left lower lobe, unchanged, possibly postobstructive. New patchy opacities in the central right middle lobe, suspicious for pneumonia. Aortic Atherosclerosis (ICD10-I70.0) and Emphysema (ICD10-J43.9). Electronically Signed   By: Julian Hy M.D.   On: 11/28/2020 00:28   MR BRAIN W WO CONTRAST  Result Date: 11/05/2020 CLINICAL DATA:  Non-small-cell lung cancer staging EXAM: MRI HEAD WITHOUT AND WITH CONTRAST TECHNIQUE: Multiplanar, multiecho pulse sequences of the brain and surrounding structures were obtained without and with  intravenous contrast. CONTRAST:  49mL GADAVIST GADOBUTROL 1 MMOL/ML IV SOLN COMPARISON:  CT head 10/27/2020 FINDINGS: Brain: No acute infarction, hemorrhage, hydrocephalus, extra-axial collection or mass lesion. Generalized atrophy. Mild to moderate white matter hyperintensity bilaterally. Hyperintensity in the central pons. Normal enhancement.  Negative for metastatic disease to the brain. Vascular: Normal arterial flow voids at the skull base Skull and upper cervical spine: Negative Sinuses/Orbits: Mild mucosal edema paranasal sinuses. Bilateral cataract extraction Other: None IMPRESSION: Negative for metastatic disease to brain Atrophy and chronic microvascular ischemic change in the white matter. Electronically Signed   By: Franchot Gallo M.D.   On: 11/05/2020 19:51   NM PET Image Initial (PI) Skull Base To Thigh  Result Date: 11/20/2020 CLINICAL DATA:  Initial treatment strategy for pulmonary nodule. EXAM: NUCLEAR MEDICINE PET SKULL BASE TO THIGH TECHNIQUE: 9.46 mCi F-18 FDG was injected intravenously.  Full-ring PET imaging was performed from the skull base to thigh after the radiotracer. CT data was obtained and used for attenuation correction and anatomic localization. Fasting blood glucose: 99 mg/dl COMPARISON:  Chest CT dated October 27, 2020 FINDINGS: Mediastinal blood pool activity: SUV max 2.8 Liver activity: SUV max 3.2 NECK: No hypermetabolic lymph nodes in the neck. Incidental CT findings: none CHEST: Large hypermetabolic left lower lobe lung mass measuring approximately 6.4 x 4.5 cm, unchanged when remeasured in similar plane, with of SUV max of 18.6. Adjacent ground-glass opacities which possibly represent postobstructive changes versus lymphangitic carcinomatosis. Enlarged and hypermetabolic mediastinal, left supraclavicular lymph nodes, and bilateral cardiophrenic lymph nodes. Reference subcarinal lymph node measures 2.8 cm in short axis on image 100 with an SUV max of 13.5. Reference right  lower paratracheal lymph node measures 1.7 cm in short axis on image 86 with an SUV max of 12.5. Reference left supraclavicular lymph node measures 9 mm in short axis on image 58 with an SUV max of 6.1. Right cardiophrenic lymph node measuring 7 mm in short axis on image 119 with an SUV max of 3.1. Incidental CT findings: Three-vessel coronary artery calcifications. Atherosclerotic disease of the thoracic aorta. Severe upper lobe predominant centrilobular emphysema. Ground-glass opacity of the right middle lobe with mild FDG uptake, likely scarring related to prior infection. ABDOMEN/PELVIS: No abnormal hypermetabolic activity within the liver, pancreas, adrenal glands, or spleen. No hypermetabolic lymph nodes in the abdomen or pelvis. Incidental CT findings: Cholecystectomy clips diverticulosis of the descending and sigmoid colon. Atherosclerotic disease of the abdominal aorta. SKELETON: No focal hypermetabolic activity to suggest skeletal metastasis. Incidental CT findings: none IMPRESSION: Large hypermetabolic left lower lobe lung mass with adjacent hypermetabolic ground-glass opacities which possibly represent postobstructive change versus lymphangitic carcinomatosis. Enlarged and hypermetabolic mediastinal, left supraclavicular lymph nodes, and bilateral cardiophrenic lymph nodes, compatible with metastatic disease. No evidence of metastatic disease in the abdomen or pelvis. Aortic Atherosclerosis (ICD10-I70.0) and Emphysema (ICD10-J43.9). Electronically Signed   By: Yetta Glassman M.D.   On: 11/20/2020 16:26   DG Chest Port 1 View  Result Date: 11/27/2020 CLINICAL DATA:  Sepsis, increasing shortness of breath, left lung mass EXAM: PORTABLE CHEST 1 VIEW COMPARISON:  11/03/2020, 11/19/2020 FINDINGS: 2 frontal views of the chest demonstrate a stable cardiac silhouette. The masslike left lower lobe consolidation seen on prior PET scan is again identified unchanged, concerning for malignancy based on  previous PET scan findings. The adjacent interstitial and ground-glass opacities throughout the left lung could reflect lymphangitic spread of disease. Stable background scarring elsewhere throughout the lungs. No new consolidation, effusion, or pneumothorax. No acute bony abnormality. IMPRESSION: 1. Stable masslike left lower lobe consolidation concerning for malignancy based on previous PET scan findings. Interstitial opacities throughout the left lower lobe could reflect lymphangitic spread of disease. 2. Otherwise stable background scarring without new airspace disease or effusion. Electronically Signed   By: Randa Ngo M.D.   On: 11/27/2020 20:04   DG Chest Portable 1 View  Result Date: 11/03/2020 CLINICAL DATA:  Short of breath, COPD EXAM: PORTABLE CHEST 1 VIEW COMPARISON:  10/27/2020 FINDINGS: Single frontal view of the chest demonstrates stable masslike consolidation at the left hilum. There is increasing consolidation within the left lung, with underlying volume loss. Background emphysema is again noted. Developing consolidation at the right lung base may be hypoventilatory. No large effusion or pneumothorax. IMPRESSION: 1. Persistent masslike consolidation at the left hilum, concerning for malignancy based on prior CT 10/27/2020. 2. Increasing left  lung consolidation, which could reflect postobstructive change. 3. Developing right basilar consolidation, favor atelectasis. 4. Stable background emphysema. Electronically Signed   By: Randa Ngo M.D.   On: 11/03/2020 18:15      ASSESSMENT/PLAN   Mild acute exacerbation of COPD with hypoxemia and hypercapnia -Agree with Unasyn and prednisone combination with typical COPD carepath utilizing Duoneb    -Left lung mass with hilar lymphadenopathy 4.9cm LLL mass  - plan for tissue diagnosis in AM  - Navigational bronchoscopy and EBUS planned for 1230pm  - patient has not had anticoagulation nor antiplatelets  - patient is NPO  - patient is  stable and wishes to proceed with surgical plan    Thank you for allowing me to participate in the care of this patient.  Total face to face encounter time for this patient visit was >45 min. >50% of the time was  spent in counseling and coordination of care.   Patient/Family are satisfied with care plan and all questions have been answered.  This document was prepared using Dragon voice recognition software and may include unintentional dictation errors.     Ottie Glazier, M.D.  Division of [facility]

## 2020-12-01 NOTE — Anesthesia Postprocedure Evaluation (Signed)
Anesthesia Post Note  Patient: Chris Lozano  Procedure(s) Performed: VIDEO BRONCHOSCOPY WITH ENDOBRONCHIAL ULTRASOUND VIDEO BRONCHOSCOPY WITH ENDOBRONCHIAL NAVIGATION  Patient location during evaluation: PACU Anesthesia Type: General Level of consciousness: awake and alert and oriented Pain management: pain level controlled Vital Signs Assessment: post-procedure vital signs reviewed and stable Respiratory status: spontaneous breathing, nonlabored ventilation and respiratory function stable Cardiovascular status: blood pressure returned to baseline and stable Postop Assessment: no signs of nausea or vomiting Anesthetic complications: no   No notable events documented.   Last Vitals:  Vitals:   12/01/20 1500 12/01/20 1515  BP: 124/72 116/62  Pulse: 73 71  Resp: 20 19  Temp:    SpO2: 96% 95%    Last Pain:  Vitals:   12/01/20 1515  TempSrc:   PainSc: 0-No pain                 Barbee Mamula

## 2020-12-02 ENCOUNTER — Encounter: Payer: Self-pay | Admitting: Pulmonary Disease

## 2020-12-02 DIAGNOSIS — R918 Other nonspecific abnormal finding of lung field: Secondary | ICD-10-CM | POA: Diagnosis not present

## 2020-12-02 DIAGNOSIS — J9601 Acute respiratory failure with hypoxia: Secondary | ICD-10-CM | POA: Diagnosis not present

## 2020-12-02 DIAGNOSIS — J9602 Acute respiratory failure with hypercapnia: Secondary | ICD-10-CM | POA: Diagnosis not present

## 2020-12-02 DIAGNOSIS — J189 Pneumonia, unspecified organism: Secondary | ICD-10-CM | POA: Diagnosis not present

## 2020-12-02 LAB — CBC
HCT: 40.5 % (ref 39.0–52.0)
Hemoglobin: 12.5 g/dL — ABNORMAL LOW (ref 13.0–17.0)
MCH: 29.5 pg (ref 26.0–34.0)
MCHC: 30.9 g/dL (ref 30.0–36.0)
MCV: 95.5 fL (ref 80.0–100.0)
Platelets: 213 10*3/uL (ref 150–400)
RBC: 4.24 MIL/uL (ref 4.22–5.81)
RDW: 15.3 % (ref 11.5–15.5)
WBC: 11.9 10*3/uL — ABNORMAL HIGH (ref 4.0–10.5)
nRBC: 0.2 % (ref 0.0–0.2)

## 2020-12-02 LAB — CULTURE, BLOOD (ROUTINE X 2)
Culture: NO GROWTH
Special Requests: ADEQUATE

## 2020-12-02 LAB — BASIC METABOLIC PANEL
Anion gap: 4 — ABNORMAL LOW (ref 5–15)
BUN: 18 mg/dL (ref 8–23)
CO2: 36 mmol/L — ABNORMAL HIGH (ref 22–32)
Calcium: 8.2 mg/dL — ABNORMAL LOW (ref 8.9–10.3)
Chloride: 101 mmol/L (ref 98–111)
Creatinine, Ser: 0.78 mg/dL (ref 0.61–1.24)
GFR, Estimated: >60 mL/min (ref >60)
Glucose, Bld: 139 mg/dL — ABNORMAL HIGH (ref 70–99)
Potassium: 4.9 mmol/L (ref 3.5–5.1)
Sodium: 141 mmol/L (ref 135–145)

## 2020-12-02 MED ORDER — SULFAMETHOXAZOLE-TRIMETHOPRIM 400-80 MG PO TABS: 1.0000 | ORAL_TABLET | ORAL | Status: DC

## 2020-12-02 MED ORDER — ALBUTEROL SULFATE (2.5 MG/3ML) 0.083% IN NEBU: 2.5000 mg | INHALATION_SOLUTION | RESPIRATORY_TRACT | Status: DC | PRN

## 2020-12-02 MED ORDER — SULFAMETHOXAZOLE-TRIMETHOPRIM 800-160 MG PO TABS
1.0000 | ORAL_TABLET | ORAL | Status: DC
  Administered 2020-12-03: 1 via ORAL
  Filled 2020-12-02: qty 1

## 2020-12-02 NOTE — Evaluation (Signed)
Occupational Therapy Evaluation Patient Details Name: Chris Lozano MRN: [id number] DOB: [birth year] Today's Date: 12/02/2020   History of Present Illness 80 y.o. male with medical history significant for coronary artery disease, PVD, aortic atherosclerosis, COPD/emphysema, chronic hypoxic respiratory failure on 2-3 L continuously at baseline, hyperlipidemia, chronic anxiety/depression, iron deficiency anemia, chronic thrombocytopenia, hypothyroidism, BPH.  Pt has been here for  similar multiple times in the last few weeks - apparently has continued to have limited activity/dizziness/weakness - now returns with COPD exacerbation.   Clinical Impression   Pt was seen for OT evaluation this date. Prior to hospital admission, pt was MOD I for mobility using 2L [place] baseline. Pt lives with spouse in home c 4 STE and family available 24/7. Pt presents to acute OT demonstrating impaired ADL performance and functional mobility 2/2 decreased activity tolerance. Pt currently requires SUPERVISION for ADL t/f including lines management, cues only for ECS. SpO2 83% on 2L [place] standing, resolved to 90% with seated rest and cues for PLB (daughter in room reports pt mouth breathes). MOD I don/doff B socks seated EOB. Pt and family educated in energy conservation strategies including pursed lip breathing, activity pacing, home/routines modifications, work simplification, AE/DME, prioritizing of meaningful occupations, and falls prevention. Pt demonstrates near baseline independence to perform ADL and mobility tasks. No skilled acute OT needs identified. Will sign off. Please re-consult if additional OT needs arise.       Recommendations for follow up therapy are one component of a multi-disciplinary discharge planning process, led by the attending physician.  Recommendations may be updated based on patient status, additional functional criteria and insurance authorization.   Follow Up Recommendations  No OT follow up     Assistance Recommended at Discharge Set up Supervision/Assistance  Functional Status Assessment  Patient has not had a recent decline in their functional status  Equipment Recommendations  Tub/shower bench    Recommendations for Other Services       Precautions / Restrictions Precautions Precautions: Fall Restrictions Weight Bearing Restrictions: No      Mobility Bed Mobility Overal bed mobility: Modified Independent             General bed mobility comments: Pt received and left in chair    Transfers Overall transfer level: Needs assistance Equipment used: None Transfers: Sit to/from Stand Sit to Stand: Supervision           General transfer comment: cues for line mgmt      Balance Overall balance assessment: Modified Independent         Standing balance support: Bilateral upper extremity supported   Standing balance comment: no LOBs reaching at above head and ankle height                           ADL either performed or assessed with clinical judgement   ADL Overall ADL's : Needs assistance/impaired                                       General ADL Comments: SUPERVISION for ADL t/f including lines management. MOD I don/doff B socks seated EOB.      Pertinent Vitals/Pain Pain Assessment: No/denies pain     Hand Dominance Right   Extremity/Trunk Assessment Upper Extremity Assessment Upper Extremity Assessment: Overall WFL for tasks assessed   Lower Extremity Assessment Lower Extremity Assessment: Overall WFL for  tasks assessed       Communication Communication Communication: No difficulties   Cognition Arousal/Alertness: Awake/alert Behavior During Therapy: WFL for tasks assessed/performed Overall Cognitive Status: Within Functional Limits for tasks assessed                                       General Comments  SpO2 83% on 2L [place] standing, resolved to 90% with seated rest and cues for  PLB (daughter in room reports pt mouth breathes).     Exercises Exercises: Other exercises Other Exercises Other Exercises: Pt and family educated re: OT role, DME recs, d/c recs, falls prevention, ECS Other Exercises: LBD, sit<>stand, sitting/standing balance/tolerance, functional reach   Shoulder Instructions      Home Living Family/patient expects to be discharged to:: Private residence Living Arrangements: Spouse/significant other Available Help at Discharge: Family;Available 24 hours/day   Home Access: Stairs to enter [employer] of Steps: 4 Entrance Stairs-Rails: Can reach both Home Layout: One level     Bathroom Shower/Tub: Tub/shower unit         Home Equipment: Conservation officer, nature (2 wheels);Cane - single point          Prior Functioning/Environment Prior Level of Function : Independent/Modified Independent             Mobility Comments: limited household mobility, will walk dog with spouse on sidewalk          OT Problem List: Decreased activity tolerance         OT Goals(Current goals can be found in the care plan section) Acute Rehab OT Goals Patient Stated Goal: to go home OT Goal Formulation: With patient/family Time For Goal Achievement: 12/16/20 Potential to Achieve Goals: Good   AM-PAC OT "6 Clicks" Daily Activity     Outcome Measure Help from another person eating meals?: None Help from another person taking care of personal grooming?: A Little Help from another person toileting, which includes using toliet, bedpan, or urinal?: None Help from another person bathing (including washing, rinsing, drying)?: A Little Help from another person to put on and taking off regular upper body clothing?: None Help from another person to put on and taking off regular lower body clothing?: None 6 Click Score: 22   End of Session Equipment Utilized During Treatment: Oxygen  Activity Tolerance: Patient tolerated treatment well Patient left: in  chair;with call bell/phone within reach;with chair alarm set;with family/visitor present  OT Visit Diagnosis: Other abnormalities of gait and mobility (R26.89)                Time: 1434-1500 OT Time Calculation (min): 26 min Charges:  OT General Charges $OT Visit: 1 Visit OT Evaluation $OT Eval Moderate Complexity: 1 Mod OT Treatments $Self Care/Home Management : 8-22 mins  Dessie Coma, M.S. OTR/L  12/02/20, 3:17 PM  ascom [phone]

## 2020-12-02 NOTE — Progress Notes (Signed)
PROGRESS NOTE   HPI was taken from Dr. Sidney Ace: Chris Lozano is a 80 y.o. Caucasian male with medical history significant for COPD, coronary artery disease, GERD, hyperlipidemia, obstructive sleep apnea and asthma with recent diagnosis of lung mass who is awaiting work-up at [facility] for possible lung cancer, who presented to the ER with acute onset of worsening dyspnea that started this afternoon per his wife with hypoxemia to mid 71s by EMS.  He was in significant respiratory distress he was placed on CPAP by EMS and switched to BiPAP in the ER.  His wife gave all the history stated that he was having wheezing without significant cough and that he was having shaking chills and feeling warm.  No reported chest pain or palpitations.  She denied any nausea or vomiting or abdominal pain.  No headache or dizziness or blurred vision, paresthesias or focal muscle weakness.  No dysuria, oliguria, urinary frequency or urgency or flank pain.  He had similar presentation for which she was admitted here on 9/26 and discharged on 9/30 after being managed for pneumonia, COPD exacerbation and acute respiratory failure.  Per his wife he has been doing well since his discharge until until this p.m.  ED Course: When he came to the ER, temperature was one 1.5 and respiratory rate was 23 with a pulse oximetry of 90 to 94% on BiPAP.  Labs revealed VBG with pH 7.34 and HCO3 of 39.4 and CMP was remarkable for a CO2 of 33 and blood glucose was 161 with albumin of 3.1 and total protein of 6.9.  Lactic acid was 1.2 and CBC showed leukocytosis of 12.  Influenza antigens and COVID-19 PCR came back negative.  Blood cultures were drawn.   Hospital course from Dr. Jimmye Norman 10/21-10/25/22: Pt was found to have acute on chronic hypoxic & hypercarbic respiratory failure likely secondary to postobstructive pneumonia secondary left lower lung mass and COPD exacerbation. Pt initially required BiPAP and that since been weaned off. Pt  was put on IV unasyn, steroids, bronchodilators and incentive spirometry. Of note, pt is s/p bronchoscopy and biopsy of LLL on 12/01/20 as per pulmon. Path is pending and pt can f/u w/ Dr. Grayland Ormond outpatient to get the results. Also, when pt ambulates his SaO2 drops to low 80s on 4L [place] and pt was previously using 2 L [place] at home. Pt can likely d/c in 24 hours if pt no longer drops to lower 80s w/ 4L [place].    Chris Lozano  SLH:[id number] DOB: [birth year] DOA: 11/27/2020 PCP: Adin Hector, MD   Assessment & Plan:   Active Problems:   Mass of lower lobe of left lung   Acute respiratory failure (HCC)  Acute on chronic hypoxic and hypercarbic respiratory failure: secondary to COPD exacerbation superimposed on bacterial postobstructive pneumonia secondary to left lower lung mass. Continue on IV unasyn, steroids, bronchodilators. Continue on supplemental oxygen and wean back to baseline as tolerated, baseline is 2 LNC. Desaturates into low 80s w/ ambulation on 4L [place]. Encourage incentive spirometry. COVID19 and influenza are both neg   Lung mass: s/p LLL biopsy on 10/24 as per pulmon. Pulmon recs apprec   Post obstructive pneumonia: likely secondary to lung mass. Continue on IV unasyn, bronchodilators & supplemental oxygen    Sepsis: met criteria w/ tachypnea, tachycardia and likely postobstructive pneumonia. Sepsis resolved  Hyperkalemia: resolved  Unlikely bacteremia: blood cxs growing stap epi. Likely containment. Repeat blood cxs NGTD  HTN: continue on home dose  of BB   Hypothyroidism: continue on home dose of levothyroxine   HLD:  continue on statin   Hx of CAD: continue on coreg, aspirin, statin    DVT prophylaxis: lovenox  Code Status: full  Family Communication: discussed pt's care w/ pt's daughter, [name], and answered her questions  Disposition Plan: likely d/c back home   Level of care: Progressive Cardiac  Status is: Inpatient  Remains inpatient appropriate because:  severity of illness; desaturates into low 80s on 4L [place] w/ ambulation and hopefully d/c in 24 hours if this has improved     Consultants:  Onco   Procedures:   Antimicrobials:    Subjective: Pt c/o shortness of breath   Objective: Vitals:   12/01/20 2044 12/01/20 2312 12/02/20 0449 12/02/20 0742  BP:  120/72 96/60 119/72  Pulse:  70 (!) 58 60  Resp:  19 19 14   Temp:  97.9 F (36.6 C) 97.6 F (36.4 C) 98.2 F (36.8 C)  TempSrc:      SpO2: (!) 85% 95% 100% 99%  Weight:      Height:        Intake/Output Summary (Last 24 hours) at 12/02/2020 0749 Last data filed at 12/02/2020 0700 Gross per 24 hour  Intake 378.77 ml  Output 300 ml  Net 78.77 ml   Filed Weights   11/29/20 0310  Weight: 71.2 kg    Examination:  General exam: Appears comfortable  Respiratory system: decreased breath sounds b/l  Cardiovascular system: S1 & S2+. No rubs or clicks  Gastrointestinal system: Abd is soft, NT, ND & normal bowel sounds  Central nervous system: alert and oriented. Moves all extremities  Psychiatry: Judgement and insight appear normal. Flat mood and affect    Data Reviewed: I have personally reviewed following labs and imaging studies  CBC: Recent Labs  Lab 11/27/20 1944 11/28/20 0616 11/29/20 0400 11/30/20 0419 12/01/20 0526 12/02/20 0419  WBC 12.0* 8.2 13.6* 12.0* 11.2* 11.9*  NEUTROABS 8.8*  --   --   --   --   --   HGB 13.5 12.9* 11.7* 12.3* 12.4* 12.5*  HCT 42.5 41.3 36.6* 36.8* 38.1* 40.5  MCV 95.1 95.6 92.7 94.6 96.0 95.5  PLT 334 236 268 234 219 381   Basic Metabolic Panel: Recent Labs  Lab 11/28/20 0616 11/28/20 1730 11/29/20 0400 11/30/20 0419 12/01/20 0526 12/02/20 0419  NA 139  --  139 141 139 141  K 5.5* 4.1 4.2 3.8 4.1 4.9  CL 102  --  98 102 102 101  CO2 33*  --  32 33* 31 36*  GLUCOSE 168*  --  129* 103* 83 139*  BUN 21  --  24* 23 15 18   CREATININE 0.79  --  0.78 0.71 0.88 0.78  CALCIUM 8.2*  --  7.9* 8.0* 8.0* 8.2*    GFR: Estimated Creatinine Clearance: 65.4 mL/min (by C-G formula based on SCr of 0.78 mg/dL). Liver Function Tests: Recent Labs  Lab 11/27/20 1944  AST 32  ALT 29  ALKPHOS 90  BILITOT 0.8  PROT 6.9  ALBUMIN 3.1*   No results for input(s): LIPASE, AMYLASE in the last 168 hours. No results for input(s): AMMONIA in the last 168 hours. Coagulation Profile: Recent Labs  Lab 12/01/20 0804  INR 1.2   Cardiac Enzymes: No results for input(s): CKTOTAL, CKMB, CKMBINDEX, TROPONINI in the last 168 hours. BNP (last 3 results) No results for input(s): PROBNP in the last 8760 hours. HbA1C: No results for  input(s): HGBA1C in the last 72 hours. CBG: No results for input(s): GLUCAP in the last 168 hours. Lipid Profile: No results for input(s): CHOL, HDL, LDLCALC, TRIG, CHOLHDL, LDLDIRECT in the last 72 hours. Thyroid Function Tests: No results for input(s): TSH, T4TOTAL, FREET4, T3FREE, THYROIDAB in the last 72 hours. Anemia Panel: No results for input(s): VITAMINB12, FOLATE, FERRITIN, TIBC, IRON, RETICCTPCT in the last 72 hours. Sepsis Labs: Recent Labs  Lab 11/27/20 1946 11/28/20 0616  LATICACIDVEN 1.2 1.3    Recent Results (from the past 240 hour(s))  Blood Culture (routine x 2)     Status: None   Collection Time: 11/27/20  7:44 PM   Specimen: BLOOD  Result Value Ref Range Status   Specimen Description BLOOD BLOOD LEFT FOREARM  Final   Special Requests   Final    BOTTLES DRAWN AEROBIC AND ANAEROBIC Blood Culture adequate volume   Culture   Final    NO GROWTH 5 DAYS Performed at [facility], [address] [place], [place] [address]    Report Status 12/02/2020 FINAL  Final  Blood Culture (routine x 2)     Status: Abnormal   Collection Time: 11/27/20  7:44 PM   Specimen: BLOOD  Result Value Ref Range Status   Specimen Description   Final    BLOOD RIGHT ANTECUBITAL Performed at [facility], [address] [place], [place] [address]    Special  Requests   Final    BOTTLES DRAWN AEROBIC AND ANAEROBIC Blood Culture adequate volume Performed at [facility], [address] [place], [place] [address]    Culture  Setup Time   Final    GRAM POSITIVE COCCI AEROBIC BOTTLE ONLY CRITICAL RESULT CALLED TO, READ BACK BY AND VERIFIED WITH: SUSAN WATSON 11/28/20 1526 KLW    Culture (A)  Final    STAPHYLOCOCCUS EPIDERMIDIS THE SIGNIFICANCE OF ISOLATING THIS ORGANISM FROM A SINGLE SET OF BLOOD CULTURES WHEN MULTIPLE SETS ARE DRAWN IS UNCERTAIN. PLEASE NOTIFY THE MICROBIOLOGY DEPARTMENT WITHIN ONE WEEK IF SPECIATION AND SENSITIVITIES ARE REQUIRED. Performed at [facility], [place] [address] [place], [place] [address]    Report Status 11/30/2020 FINAL  Final  Resp Panel by RT-PCR (Flu A&B, Covid) Nasopharyngeal Swab     Status: None   Collection Time: 11/27/20  7:44 PM   Specimen: Nasopharyngeal Swab; Nasopharyngeal(NP) swabs in vial transport medium  Result Value Ref Range Status   SARS Coronavirus 2 by RT PCR NEGATIVE NEGATIVE Final    Comment: (NOTE) SARS-CoV-2 target nucleic acids are NOT DETECTED.  The SARS-CoV-2 RNA is generally detectable in upper respiratory specimens during the acute phase of infection. The lowest concentration of SARS-CoV-2 viral copies this assay can detect is 138 copies/mL. A negative result does not preclude SARS-Cov-2 infection and should not be used as the sole basis for treatment or other patient management decisions. A negative result may occur with  improper specimen collection/handling, submission of specimen other than nasopharyngeal swab, presence of viral mutation(s) within the areas targeted by this assay, and inadequate number of viral copies(<138 copies/mL). A negative result must be combined with clinical observations, patient history, and epidemiological information. The expected result is Negative.  Fact Sheet for Patients:  EntrepreneurPulse.com.au  Fact  Sheet for Healthcare Providers:  IncredibleEmployment.be  This test is no t yet approved or cleared by the [place] FDA and  has been authorized for detection and/or diagnosis of SARS-CoV-2 by FDA under an Emergency Use Authorization (EUA). This  EUA will remain  in effect (meaning this test can be used) for the duration of the COVID-19 declaration under Section 564(b)(1) of the Act, 21 U.S.C.section 360bbb-3(b)(1), unless the authorization is terminated  or revoked sooner.       Influenza A by PCR NEGATIVE NEGATIVE Final   Influenza B by PCR NEGATIVE NEGATIVE Final    Comment: (NOTE) The Xpert Xpress SARS-CoV-2/FLU/RSV plus assay is intended as an aid in the diagnosis of influenza from Nasopharyngeal swab specimens and should not be used as a sole basis for treatment. Nasal washings and aspirates are unacceptable for Xpert Xpress SARS-CoV-2/FLU/RSV testing.  Fact Sheet for Patients: EntrepreneurPulse.com.au  Fact Sheet for Healthcare Providers: IncredibleEmployment.be  This test is not yet approved or cleared by the [place] FDA and has been authorized for detection and/or diagnosis of SARS-CoV-2 by FDA under an Emergency Use Authorization (EUA). This EUA will remain in effect (meaning this test can be used) for the duration of the COVID-19 declaration under Section 564(b)(1) of the Act, 21 U.S.C. section 360bbb-3(b)(1), unless the authorization is terminated or revoked.  Performed at [facility], [address] [place], [place] [address]   Blood Culture ID Panel (Reflexed)     Status: Abnormal   Collection Time: 11/27/20  7:44 PM  Result Value Ref Range Status   Enterococcus faecalis NOT DETECTED NOT DETECTED Final   Enterococcus Faecium NOT DETECTED NOT DETECTED Final   Listeria monocytogenes NOT DETECTED NOT DETECTED Final   Staphylococcus species DETECTED (A) NOT DETECTED Final    Comment:  CRITICAL RESULT CALLED TO, READ BACK BY AND VERIFIED WITH: SUSAN WATSON 11/28/20 1526 KLW    Staphylococcus aureus (BCID) NOT DETECTED NOT DETECTED Final   Staphylococcus epidermidis DETECTED (A) NOT DETECTED Final    Comment: Methicillin (oxacillin) resistant coagulase negative staphylococcus. Possible blood culture contaminant (unless isolated from more than one blood culture draw or clinical case suggests pathogenicity). No antibiotic treatment is indicated for blood  culture contaminants. CRITICAL RESULT CALLED TO, READ BACK BY AND VERIFIED WITH: SUSAN WATSON 11/28/20 1526 KLW    Staphylococcus lugdunensis NOT DETECTED NOT DETECTED Final   Streptococcus species NOT DETECTED NOT DETECTED Final   Streptococcus agalactiae NOT DETECTED NOT DETECTED Final   Streptococcus pneumoniae NOT DETECTED NOT DETECTED Final   Streptococcus pyogenes NOT DETECTED NOT DETECTED Final   A.calcoaceticus-baumannii NOT DETECTED NOT DETECTED Final   Bacteroides fragilis NOT DETECTED NOT DETECTED Final   Enterobacterales NOT DETECTED NOT DETECTED Final   Enterobacter cloacae complex NOT DETECTED NOT DETECTED Final   Escherichia coli NOT DETECTED NOT DETECTED Final   Klebsiella aerogenes NOT DETECTED NOT DETECTED Final   Klebsiella oxytoca NOT DETECTED NOT DETECTED Final   Klebsiella pneumoniae NOT DETECTED NOT DETECTED Final   Proteus species NOT DETECTED NOT DETECTED Final   Salmonella species NOT DETECTED NOT DETECTED Final   Serratia marcescens NOT DETECTED NOT DETECTED Final   Haemophilus influenzae NOT DETECTED NOT DETECTED Final   Neisseria meningitidis NOT DETECTED NOT DETECTED Final   Pseudomonas aeruginosa NOT DETECTED NOT DETECTED Final   Stenotrophomonas maltophilia NOT DETECTED NOT DETECTED Final   Candida albicans NOT DETECTED NOT DETECTED Final   Candida auris NOT DETECTED NOT DETECTED Final   Candida glabrata NOT DETECTED NOT DETECTED Final   Candida krusei NOT DETECTED NOT DETECTED Final    Candida parapsilosis NOT DETECTED NOT DETECTED Final   Candida tropicalis NOT DETECTED NOT DETECTED Final   Cryptococcus neoformans/gattii NOT DETECTED NOT DETECTED Final  Methicillin resistance mecA/C DETECTED (A) NOT DETECTED Final    Comment: CRITICAL RESULT CALLED TO, READ BACK BY AND VERIFIED WITH: SUSAN WATSON 11/28/20 [facility], [address] [place], [place] [address]   MRSA Next Gen by PCR, Nasal     Status: None   Collection Time: 11/28/20  7:49 PM   Specimen: Nasal Mucosa; Nasal Swab  Result Value Ref Range Status   MRSA by PCR Next Gen NOT DETECTED NOT DETECTED Final    Comment: (NOTE) The GeneXpert MRSA Assay (FDA approved for NASAL specimens only), is one component of a comprehensive MRSA colonization surveillance program. It is not intended to diagnose MRSA infection nor to guide or monitor treatment for MRSA infections. Test performance is not FDA approved in patients less than 59 years old. Performed at [facility], [address] [place], [place] [address]   Urine Culture     Status: None   Collection Time: 11/28/20 10:20 PM   Specimen: Urine, Random  Result Value Ref Range Status   Specimen Description   Final    URINE, RANDOM Performed at [facility], [address] [place], [place] [address]    Special Requests   Final    NONE Performed at [facility], [address], [place], [place] [address]    Culture   Final    NO GROWTH Performed at [facility], [place] [address] [place], [place] [address]    Report Status 11/30/2020 FINAL  Final  CULTURE, BLOOD (ROUTINE X 2) w Reflex to ID Panel     Status: None (Preliminary result)   Collection Time: 11/29/20  8:32 AM   Specimen: BLOOD LEFT HAND  Result Value Ref Range Status   Specimen Description BLOOD LEFT HAND  Final   Special Requests   Final    BOTTLES DRAWN AEROBIC AND ANAEROBIC Blood Culture adequate volume   Culture    Final    NO GROWTH 3 DAYS Performed at [facility], [address] [place], [place] [address]    Report Status PENDING  Incomplete  CULTURE, BLOOD (ROUTINE X 2) w Reflex to ID Panel     Status: None (Preliminary result)   Collection Time: 11/29/20  8:32 AM   Specimen: BLOOD RIGHT HAND  Result Value Ref Range Status   Specimen Description BLOOD RIGHT HAND  Final   Special Requests   Final    BOTTLES DRAWN AEROBIC AND ANAEROBIC Blood Culture results may not be optimal due to an excessive volume of blood received in culture bottles   Culture   Final    NO GROWTH 3 DAYS Performed at [facility], [address], [place], [place] [address]    Report Status PENDING  Incomplete         Radiology Studies: DG Chest Port 1 View  Result Date: 12/01/2020 CLINICAL DATA:  S/P bronchoscopy Z98.890 (ICD-10-CM) EXAM: PORTABLE CHEST 1 VIEW COMPARISON:  November 27, 2020. FINDINGS: Stable cardiomediastinal silhouette. Persistent masslike consolidation in the left midlung. No visible pleural effusions or pneumothorax on this semi erect radiograph. Similar patchy interstitial and airspace opacities in both lungs. IMPRESSION: 1. No visible pneumothorax status post bronchoscopy on this semi erect radiograph. 2. Similar masslike consolidation left midlung, better characterized on recent CT chest. 3. Similar patchy interstitial and airspace opacities in both lungs, possibly pneumonia. Electronically Signed   By: Margaretha Sheffield M.D.   On: 12/01/2020 19:36   DG C-Arm  1-60 Min-No Report  Result Date: 12/01/2020 Fluoroscopy was utilized by the requesting physician.  No radiographic interpretation.        Scheduled Meds:  ascorbic acid  500 mg Oral q AM   atorvastatin  80 mg Oral Daily   carvedilol  3.125 mg Oral BID   cholecalciferol  1,000 Units Oral Daily   guaiFENesin  600 mg Oral BID   ipratropium-albuterol  3 mL Nebulization TID   levothyroxine  25 mcg Oral QAC breakfast    multivitamin with minerals  1 tablet Oral q AM   predniSONE  40 mg Oral Q breakfast   Continuous Infusions:  sodium chloride 10 mL/hr at 12/02/20 0530   ampicillin-sulbactam (UNASYN) IV 3 g (12/02/20 0531)     LOS: 5 days    Time spent: 15 mins     Wyvonnia Dusky, MD [facility] Pager [phone]  If 7PM-7AM, please contact night-coverage 12/02/2020, 7:49 AM

## 2020-12-02 NOTE — Evaluation (Signed)
Physical Therapy Evaluation Patient Details Name: Chris Lozano MRN: [id number] DOB: December 18, 1940 Today's Date: 12/02/2020  History of Present Illness  80 y.o. male with medical history significant for coronary artery disease, PVD, aortic atherosclerosis, COPD/emphysema, chronic hypoxic respiratory failure on 2-3 L continuously at baseline, hyperlipidemia, chronic anxiety/depression, iron deficiency anemia, chronic thrombocytopenia, hypothyroidism, BPH.  Pt has been here for  similar multiple times in the last few weeks - apparently has continued to have limited activity/dizziness/weakness - now returns with COPD exacerbation.  Clinical Impression  Similar to prior admission ~1 month ago pt did relatively well with mobility and was able to perform long bout of ambulation, biggest issues still appears to be the O2 requirement/sats.  On arrival to room he was on 2L with sats in the mid/upper 80s, unable to >90% even with focused breathing, spoke with nursing and increased to 4L, sats did breach 90s but t/o most of ambulation on 4L sats remained in the mid/upper 80s range.  On return to room he was able to get sats >90 at rest and even maintain >90 on 2L during post ambulation discussion and education.  Pt does not have a lot of purely PT needs and is mostly limited due to O2 considerations.      Recommendations for follow up therapy are one component of a multi-disciplinary discharge planning process, led by the attending physician.  Recommendations may be updated based on patient status, additional functional criteria and insurance authorization.  Follow Up Recommendations No PT follow up    Assistance Recommended at Discharge PRN  Functional Status Assessment Patient has had a recent decline in their functional status and demonstrates the ability to make significant improvements in function in a reasonable and predictable amount of time.  Equipment Recommendations  None recommended by PT (did  discuss using [phone] with pt/daughter)    Recommendations for Other Services       Precautions / Restrictions Precautions Precautions: Fall Restrictions Weight Bearing Restrictions: No      Mobility  Bed Mobility Overal bed mobility: Modified Independent             General bed mobility comments: Pt able to get up to EOB w/o assist or hesitation    Transfers Overall transfer level: Modified independent Equipment used: Rolling walker (2 wheels) Transfers: Sit to/from Stand Sit to Stand: Supervision           General transfer comment: Cuing to insure slow and safe transition to standing, able to rise w/o assist.    Ambulation/Gait Ambulation/Gait assistance: Supervision Gait Distance (Feet): 250 Feet Assistive device: Rolling walker (2 wheels)       General Gait Details: Pt on 4L t/o circumambulation of the nurses' station with walker.  He had some mild shortness of breath w/o significant DOE, sats remained in the mid/upper 80s t/o most of the effort.  Pt did recover to >90 after seated rest break and we were able to drop O2 back to 2 L after a few minutes  Stairs            Wheelchair Mobility    Modified Rankin (Stroke Patients Only)       Balance Overall balance assessment: Modified Independent         Standing balance support: Bilateral upper extremity supported   Standing balance comment: Pt with no overt LOBs or safety issues while using walker, did self select forward leaning posture needing cuing to improve  Pertinent Vitals/Pain Pain Assessment: No/denies pain    Home Living Family/patient expects to be discharged to:: Private residence Living Arrangements: Spouse/significant other Available Help at Discharge: Family;Available 24 hours/day   Home Access: Stairs to enter Entrance Stairs-Rails: Can reach both Entrance Stairs-Number of Steps: 4   Home Layout: One level Home Equipment: Chartered certified accountant (2 wheels);Cane - single point      Prior Function Prior Level of Function : Independent/Modified Independent             Mobility Comments: Pt apparently has not been out of the home much for the last few months, but can be briefly active in the home       Hand Dominance   Dominant Hand: Right    Extremity/Trunk Assessment   Upper Extremity Assessment Upper Extremity Assessment: Overall WFL for tasks assessed;Generalized weakness    Lower Extremity Assessment Lower Extremity Assessment: Overall WFL for tasks assessed;Generalized weakness       Communication   Communication: No difficulties  Cognition Arousal/Alertness: Awake/alert Behavior During Therapy: WFL for tasks assessed/performed Overall Cognitive Status: Within Functional Limits for tasks assessed                                          General Comments      Exercises     Assessment/Plan    PT Assessment Patient needs continued PT services  PT Problem List Decreased strength;Decreased range of motion;Decreased activity tolerance;Decreased balance;Decreased mobility;Decreased coordination;Decreased safety awareness;Cardiopulmonary status limiting activity       PT Treatment Interventions DME instruction;Gait training;Stair training;Functional mobility training;Therapeutic activities;Therapeutic exercise;Balance training;Neuromuscular re-education    PT Goals (Current goals can be found in the Care Plan section)  Acute Rehab PT Goals Patient Stated Goal: breath better PT Goal Formulation: With patient Time For Goal Achievement: 12/16/20 Potential to Achieve Goals: Good    Frequency Min 2X/week   Barriers to discharge        Co-evaluation               AM-PAC PT "6 Clicks" Mobility  Outcome Measure Help needed turning from your back to your side while in a flat bed without using bedrails?: None Help needed moving from lying on your back to sitting on the side  of a flat bed without using bedrails?: None Help needed moving to and from a bed to a chair (including a wheelchair)?: None Help needed standing up from a chair using your arms (e.g., wheelchair or bedside chair)?: None Help needed to walk in hospital room?: None Help needed climbing 3-5 steps with a railing? : A Little 6 Click Score: 23    End of Session Equipment Utilized During Treatment: Gait belt;Oxygen Activity Tolerance: Patient limited by fatigue Patient left: with chair alarm set;with call bell/phone within reach;with family/visitor present Nurse Communication: Mobility status (o2 with activity) PT Visit Diagnosis: Difficulty in walking, not elsewhere classified (R26.2);Unsteadiness on feet (R26.81)    Time: 1019-1050 PT Time Calculation (min) (ACUTE ONLY): 31 min   Charges:   PT Evaluation $PT Eval Low Complexity: 1 Low PT Treatments $Gait Training: 8-22 mins        Kreg Shropshire, DPT 12/02/2020, 1:40 PM

## 2020-12-02 NOTE — Progress Notes (Signed)
Pulmonary Medicine          Date: 12/02/2020,   MRN# [id number] Chris Lozano 1940-06-30     AdmissionWeight: 71.2 kg                 CurrentWeight: 71.2 kg   Refferring physician: Dr Jimmye Norman   CHIEF COMPLAINT:   Acute on chronic hypoxemic respiratory failure   HISTORY OF PRESENT ILLNESS   This is a very pleasant 80yo M with hx of Advanced COPD, dyslipidemia, OSA/Asthma overlap syndrome who came in to ER with worsening dyspnea. He has supportive family and daughter who keeps close watch over him. I had evaluated patient 11/21/20 in pulmonology clinic with [facility] and he shared that he was inconsistently compliant with his BIPAP and uses 2L bleed in O2 supplementally at his baseline while resting. He had Mass found on chest CT which was appx 4.9cm and we looked at images together and talked about diagnostic options including bronchoscopy with biopsy which patient wanted to have done asap. 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 He is now here in hospital for acute on chronic hypoxemic respiratory failure. PCCM consultation for further evaluation and management.   12/01/20- patient stable with no overnight events. Family including daughter and wife are at bedside during my evaluation. Plan for bronchoscopy today.   12/02/20- patient is cleared for dc home today. I met with family and reviewed everything today.   PAST MEDICAL HISTORY   Past Medical History:  Diagnosis Date  . Asthma   . BPH (benign prostatic hyperplasia)   . CAD (coronary artery disease)   . COPD (chronic obstructive pulmonary disease) ([place])   . GERD (gastroesophageal reflux disease)   . HLD (hyperlipidemia)   . Sleep apnea      SURGICAL HISTORY   Past Surgical History:  Procedure Laterality Date  . ADENOIDECTOMY    . CARDIAC CATHETERIZATION    . GALLBLADDER SURGERY    . LEFT HEART CATH AND CORONARY ANGIOGRAPHY Left 01/10/2019    Procedure: LEFT HEART CATH AND CORONARY ANGIOGRAPHY;  Surgeon: Teodoro Spray, MD;  Location: [place] [facility];  Service: Cardiovascular;  Laterality: Left;  . TONSILLECTOMY       FAMILY HISTORY   Family History  Problem Relation Age of Onset  . Heart disease Other   . Kidney disease Neg Hx   . Prostate cancer Neg Hx   . Kidney cancer Neg Hx   . Bladder Cancer Neg Hx      SOCIAL HISTORY   Social History   Tobacco Use  . Smoking status: Former  . Smokeless tobacco: Former    Types: Chew  . Tobacco comments:    as a teenager  Vaping Use  . Vaping Use: Never used  Substance Use Topics  . Alcohol use: Yes    Alcohol/week: 0.0 standard drinks    Comment: occasionally  . Drug use: No     MEDICATIONS    Home Medication:    Current Medication:  Current Facility-Administered Medications:  .  0.9 %  sodium chloride infusion, , Intravenous, PRN, Wyvonnia Dusky, MD, Last Rate: 10 mL/hr at 12/02/20 0530, New Bag at 12/02/20 0530 .  acetaminophen (TYLENOL) tablet 650 mg, 650 mg, Oral, Q6H PRN **OR** acetaminophen (TYLENOL) suppository 650 mg, 650 mg, Rectal, Q6H PRN, Mansy, Jan A, MD .  albuterol (PROVENTIL) (2.5 MG/3ML) 0.083% nebulizer solution 2.5 mg,  2.5 mg, Nebulization, Q6H PRN, Renda Rolls, RPH .  Ampicillin-Sulbactam (UNASYN) 3 g in sodium chloride 0.9 % 100 mL IVPB, 3 g, Intravenous, Q8H, Wyvonnia Dusky, MD, Last Rate: 200 mL/hr at 12/02/20 0531, 3 g at 12/02/20 0531 .  ascorbic acid (VITAMIN C) tablet 500 mg, 500 mg, Oral, q AM, Mansy, Jan A, MD, 500 mg at 12/01/20 1057 .  atorvastatin (LIPITOR) tablet 80 mg, 80 mg, Oral, Daily, Mansy, Jan A, MD, 80 mg at 12/01/20 1056 .  carvedilol (COREG) tablet 3.125 mg, 3.125 mg, Oral, BID, Mansy, Jan A, MD, 3.125 mg at 12/01/20 2141 .  chlorpheniramine-HYDROcodone (TUSSIONEX) 10-8 MG/5ML suspension 5 mL, 5 mL, Oral, Q12H PRN, Mansy, Jan A, MD .  cholecalciferol (VITAMIN D) tablet 1,000 Units, 1,000 Units, Oral,  Daily, Mansy, Jan A, MD, 1,000 Units at 12/01/20 1057 .  gabapentin (NEURONTIN) capsule 100 mg, 100 mg, Oral, Daily PRN, Mansy, Jan A, MD .  guaiFENesin (MUCINEX) 12 hr tablet 600 mg, 600 mg, Oral, BID, Mansy, Jan A, MD, 600 mg at 12/01/20 2141 .  ipratropium-albuterol (DUONEB) 0.5-2.5 (3) MG/3ML nebulizer solution 3 mL, 3 mL, Nebulization, TID, Wyvonnia Dusky, MD, 3 mL at 12/01/20 2044 .  levothyroxine (SYNTHROID) tablet 25 mcg, 25 mcg, Oral, QAC breakfast, Mansy, Jan A, MD, 25 mcg at 12/02/20 0532 .  magnesium hydroxide (MILK OF MAGNESIA) suspension 30 mL, 30 mL, Oral, Daily PRN, Mansy, Jan A, MD .  multivitamin with minerals tablet 1 tablet, 1 tablet, Oral, q AM, Mansy, Jan A, MD, 1 tablet at 12/01/20 1056 .  ondansetron (ZOFRAN) tablet 4 mg, 4 mg, Oral, Q6H PRN **OR** ondansetron (ZOFRAN) injection 4 mg, 4 mg, Intravenous, Q6H PRN, Mansy, Jan A, MD .  [COMPLETED] methylPREDNISolone sodium succinate (SOLU-MEDROL) 40 mg/mL injection 40 mg, 40 mg, Intravenous, Q12H, 40 mg at 11/28/20 1308 **FOLLOWED BY** predniSONE (DELTASONE) tablet 40 mg, 40 mg, Oral, Q breakfast, Mansy, Jan A, MD, 40 mg at 12/01/20 1058 .  traZODone (DESYREL) tablet 25 mg, 25 mg, Oral, QHS PRN, Mansy, Arvella Merles, MD    ALLERGIES   Fluocinolone and Ciprofloxacin hcl     REVIEW OF SYSTEMS    Review of Systems:  Gen:  Denies  fever, sweats, chills weigh loss  HEENT: Denies blurred vision, double vision, ear pain, eye pain, hearing loss, nose bleeds, sore throat Cardiac:  No dizziness, chest pain or heaviness, chest tightness,edema Resp:   Denies cough or sputum porduction, shortness of breath,wheezing, hemoptysis,  Gi: Denies swallowing difficulty, stomach pain, nausea or vomiting, diarrhea, constipation, bowel incontinence Gu:  Denies bladder incontinence, burning urine Ext:   Denies Joint pain, stiffness or swelling Skin: Denies  skin rash, easy bruising or bleeding or hives Endoc:  Denies polyuria, polydipsia ,  polyphagia or weight change Psych:   Denies depression, insomnia or hallucinations   Other:  All other systems negative   VS: BP 96/60 (BP Location: Right Arm)   Pulse (!) 58   Temp 97.6 F (36.4 C)   Resp 19   Ht _0  (1.676 m)   Wt 71.2 kg   SpO2 100%   BMI 25.34 kg/m      PHYSICAL EXAM    GENERAL:NAD, no fevers, chills, no weakness no fatigue HEAD: Normocephalic, atraumatic.  EYES: Pupils equal, round, reactive to light. Extraocular muscles intact. No scleral icterus.  MOUTH: Moist mucosal membrane. Dentition intact. No abscess noted.  EAR, NOSE, THROAT: Clear without exudates. No external lesions.  NECK: Supple. No  thyromegaly. No nodules. No JVD.  PULMONARY: bilateral mild rhonchi CARDIOVASCULAR: S1 and S2. Regular rate and rhythm. No murmurs, rubs, or gallops. No edema. Pedal pulses 2+ bilaterally.  GASTROINTESTINAL: Soft, nontender, nondistended. No masses. Positive bowel sounds. No hepatosplenomegaly.  MUSCULOSKELETAL: No swelling, clubbing, or edema. Range of motion full in all extremities.  NEUROLOGIC: Cranial nerves II through XII are intact. No gross focal neurological deficits. Sensation intact. Reflexes intact.  SKIN: No ulceration, lesions, rashes, or cyanosis. Skin warm and dry. Turgor intact.  PSYCHIATRIC: Mood, affect within normal limits. The patient is awake, alert and oriented x 3. Insight, judgment intact.       IMAGING    CT Angio Chest PE W and/or Wo Contrast  Result Date: 11/28/2020 CLINICAL DATA:  Increased shortness of breath, hypoxia, fever EXAM: CT ANGIOGRAPHY CHEST WITH CONTRAST TECHNIQUE: Multidetector CT imaging of the chest was performed using the standard protocol during bolus administration of intravenous contrast. Multiplanar CT image reconstructions and MIPs were obtained to evaluate the vascular anatomy. CONTRAST:  35mL OMNIPAQUE IOHEXOL 350 MG/ML SOLN COMPARISON:  PET-CT dated 11/19/2020 FINDINGS: Cardiovascular: Satisfactory  opacification the bilateral pulmonary arteries to the segmental level. No evidence of pulmonary embolism. Although not tailored for evaluation of the thoracic aorta, there is no evidence of thoracic aortic aneurysm or dissection. Atherosclerotic calcifications of the aortic arch. Heart is normal in size.  No pericardial effusion. Coronary atherosclerosis of the LAD and right coronary artery. Mediastinum/Nodes: 10 mm short axis left supraclavicular node (series 4/image 10), unchanged. Mediastinal lymphadenopathy, including a dominant 2.5 cm short axis subcarinal node (series 4/image 55), similar. Visualized thyroid is unremarkable. Lungs/Pleura: 4.5 x 6.7 cm left lower lobe mass (series 6/image 56), compatible with primary bronchogenic neoplasm when correlating with recent PET. Mild subpleural patchy opacities in the left lower lobe (series 6/image 33), unchanged, possibly post obstructive. New patchy opacities in the central right middle lobe (series 6/image 32), suspicious for pneumonia. Small right pleural effusion. Associated right lower lobe atelectasis. Moderate centrilobular and paraseptal emphysematous changes, upper lung predominant. No pneumothorax. Upper Abdomen: Visualized upper abdomen is notable for prior cholecystectomy and vascular calcifications. Musculoskeletal: Mild degenerative changes of the visualized thoracolumbar spine. Review of the MIP images confirms the above findings. IMPRESSION: No evidence of pulmonary embolism. 6.7 cm left lower lobe mass, compatible with primary bronchogenic neoplasm when correlating with recent CT. Associated thoracic nodal metastases. Mild subpleural patchy opacities in the left lower lobe, unchanged, possibly postobstructive. New patchy opacities in the central right middle lobe, suspicious for pneumonia. Aortic Atherosclerosis (ICD10-I70.0) and Emphysema (ICD10-J43.9). Electronically Signed   By: Julian Hy M.D.   On: 11/28/2020 00:28   MR BRAIN W WO  CONTRAST  Result Date: 11/05/2020 CLINICAL DATA:  Non-small-cell lung cancer staging EXAM: MRI HEAD WITHOUT AND WITH CONTRAST TECHNIQUE: Multiplanar, multiecho pulse sequences of the brain and surrounding structures were obtained without and with intravenous contrast. CONTRAST:  86mL GADAVIST GADOBUTROL 1 MMOL/ML IV SOLN COMPARISON:  CT head 10/27/2020 FINDINGS: Brain: No acute infarction, hemorrhage, hydrocephalus, extra-axial collection or mass lesion. Generalized atrophy. Mild to moderate white matter hyperintensity bilaterally. Hyperintensity in the central pons. Normal enhancement.  Negative for metastatic disease to the brain. Vascular: Normal arterial flow voids at the skull base Skull and upper cervical spine: Negative Sinuses/Orbits: Mild mucosal edema paranasal sinuses. Bilateral cataract extraction Other: None IMPRESSION: Negative for metastatic disease to brain Atrophy and chronic microvascular ischemic change in the white matter. Electronically Signed   By: Franchot Gallo M.D.  On: 11/05/2020 19:51   NM PET Image Initial (PI) Skull Base To Thigh  Result Date: 11/20/2020 CLINICAL DATA:  Initial treatment strategy for pulmonary nodule. EXAM: NUCLEAR MEDICINE PET SKULL BASE TO THIGH TECHNIQUE: 9.46 mCi F-18 FDG was injected intravenously. Full-ring PET imaging was performed from the skull base to thigh after the radiotracer. CT data was obtained and used for attenuation correction and anatomic localization. Fasting blood glucose: 99 mg/dl COMPARISON:  Chest CT dated October 27, 2020 FINDINGS: Mediastinal blood pool activity: SUV max 2.8 Liver activity: SUV max 3.2 NECK: No hypermetabolic lymph nodes in the neck. Incidental CT findings: none CHEST: Large hypermetabolic left lower lobe lung mass measuring approximately 6.4 x 4.5 cm, unchanged when remeasured in similar plane, with of SUV max of 18.6. Adjacent ground-glass opacities which possibly represent postobstructive changes versus lymphangitic  carcinomatosis. Enlarged and hypermetabolic mediastinal, left supraclavicular lymph nodes, and bilateral cardiophrenic lymph nodes. Reference subcarinal lymph node measures 2.8 cm in short axis on image 100 with an SUV max of 13.5. Reference right lower paratracheal lymph node measures 1.7 cm in short axis on image 86 with an SUV max of 12.5. Reference left supraclavicular lymph node measures 9 mm in short axis on image 58 with an SUV max of 6.1. Right cardiophrenic lymph node measuring 7 mm in short axis on image 119 with an SUV max of 3.1. Incidental CT findings: Three-vessel coronary artery calcifications. Atherosclerotic disease of the thoracic aorta. Severe upper lobe predominant centrilobular emphysema. Ground-glass opacity of the right middle lobe with mild FDG uptake, likely scarring related to prior infection. ABDOMEN/PELVIS: No abnormal hypermetabolic activity within the liver, pancreas, adrenal glands, or spleen. No hypermetabolic lymph nodes in the abdomen or pelvis. Incidental CT findings: Cholecystectomy clips diverticulosis of the descending and sigmoid colon. Atherosclerotic disease of the abdominal aorta. SKELETON: No focal hypermetabolic activity to suggest skeletal metastasis. Incidental CT findings: none IMPRESSION: Large hypermetabolic left lower lobe lung mass with adjacent hypermetabolic ground-glass opacities which possibly represent postobstructive change versus lymphangitic carcinomatosis. Enlarged and hypermetabolic mediastinal, left supraclavicular lymph nodes, and bilateral cardiophrenic lymph nodes, compatible with metastatic disease. No evidence of metastatic disease in the abdomen or pelvis. Aortic Atherosclerosis (ICD10-I70.0) and Emphysema (ICD10-J43.9). Electronically Signed   By: Yetta Glassman M.D.   On: 11/20/2020 16:26   DG Chest Port 1 View  Result Date: 12/01/2020 CLINICAL DATA:  S/P bronchoscopy Z98.890 (ICD-10-CM) EXAM: PORTABLE CHEST 1 VIEW COMPARISON:  November 27, 2020. FINDINGS: Stable cardiomediastinal silhouette. Persistent masslike consolidation in the left midlung. No visible pleural effusions or pneumothorax on this semi erect radiograph. Similar patchy interstitial and airspace opacities in both lungs. IMPRESSION: 1. No visible pneumothorax status post bronchoscopy on this semi erect radiograph. 2. Similar masslike consolidation left midlung, better characterized on recent CT chest. 3. Similar patchy interstitial and airspace opacities in both lungs, possibly pneumonia. Electronically Signed   By: Margaretha Sheffield M.D.   On: 12/01/2020 19:36   DG Chest Port 1 View  Result Date: 11/27/2020 CLINICAL DATA:  Sepsis, increasing shortness of breath, left lung mass EXAM: PORTABLE CHEST 1 VIEW COMPARISON:  11/03/2020, 11/19/2020 FINDINGS: 2 frontal views of the chest demonstrate a stable cardiac silhouette. The masslike left lower lobe consolidation seen on prior PET scan is again identified unchanged, concerning for malignancy based on previous PET scan findings. The adjacent interstitial and ground-glass opacities throughout the left lung could reflect lymphangitic spread of disease. Stable background scarring elsewhere throughout the lungs. No new consolidation, effusion, or  pneumothorax. No acute bony abnormality. IMPRESSION: 1. Stable masslike left lower lobe consolidation concerning for malignancy based on previous PET scan findings. Interstitial opacities throughout the left lower lobe could reflect lymphangitic spread of disease. 2. Otherwise stable background scarring without new airspace disease or effusion. Electronically Signed   By: Randa Ngo M.D.   On: 11/27/2020 20:04   DG Chest Portable 1 View  Result Date: 11/03/2020 CLINICAL DATA:  Short of breath, COPD EXAM: PORTABLE CHEST 1 VIEW COMPARISON:  10/27/2020 FINDINGS: Single frontal view of the chest demonstrates stable masslike consolidation at the left hilum. There is increasing consolidation  within the left lung, with underlying volume loss. Background emphysema is again noted. Developing consolidation at the right lung base may be hypoventilatory. No large effusion or pneumothorax. IMPRESSION: 1. Persistent masslike consolidation at the left hilum, concerning for malignancy based on prior CT 10/27/2020. 2. Increasing left lung consolidation, which could reflect postobstructive change. 3. Developing right basilar consolidation, favor atelectasis. 4. Stable background emphysema. Electronically Signed   By: Randa Ngo M.D.   On: 11/03/2020 18:15   DG C-Arm 1-60 Min-No Report  Result Date: 12/01/2020 Fluoroscopy was utilized by the requesting physician.  No radiographic interpretation.      ASSESSMENT/PLAN   Mild acute exacerbation of COPD with hypoxemia and hypercapnia -Agree with Unasyn and prednisone combination with typical COPD carepath utilizing Duoneb    -Left lung mass with hilar lymphadenopathy 4.9cm LLL mass  - plan for tissue diagnosis in AM  - Navigational bronchoscopy and EBUS planned for 1230pm  - patient has not had anticoagulation nor antiplatelets  - patient is NPO  - patient is stable and wishes to proceed with surgical plan    Thank you for allowing me to participate in the care of this patient.  Total face to face encounter time for this patient visit was >45 min. >50% of the time was  spent in counseling and coordination of care.   Patient/Family are satisfied with care plan and all questions have been answered.  This document was prepared using Dragon voice recognition software and may include unintentional dictation errors.     Ottie Glazier, M.D.  Division of [place]

## 2020-12-03 DIAGNOSIS — A419 Sepsis, unspecified organism: Secondary | ICD-10-CM

## 2020-12-03 DIAGNOSIS — E785 Hyperlipidemia, unspecified: Secondary | ICD-10-CM

## 2020-12-03 DIAGNOSIS — J9621 Acute and chronic respiratory failure with hypoxia: Secondary | ICD-10-CM | POA: Diagnosis not present

## 2020-12-03 DIAGNOSIS — I10 Essential (primary) hypertension: Secondary | ICD-10-CM

## 2020-12-03 DIAGNOSIS — E039 Hypothyroidism, unspecified: Secondary | ICD-10-CM

## 2020-12-03 DIAGNOSIS — J9622 Acute and chronic respiratory failure with hypercapnia: Secondary | ICD-10-CM

## 2020-12-03 DIAGNOSIS — R918 Other nonspecific abnormal finding of lung field: Secondary | ICD-10-CM | POA: Diagnosis not present

## 2020-12-03 DIAGNOSIS — J189 Pneumonia, unspecified organism: Secondary | ICD-10-CM | POA: Diagnosis not present

## 2020-12-03 LAB — CBC
HCT: 35.5 % — ABNORMAL LOW (ref 39.0–52.0)
Hemoglobin: 11.3 g/dL — ABNORMAL LOW (ref 13.0–17.0)
MCH: 30.1 pg (ref 26.0–34.0)
MCHC: 31.8 g/dL (ref 30.0–36.0)
MCV: 94.4 fL (ref 80.0–100.0)
Platelets: 206 10*3/uL (ref 150–400)
RBC: 3.76 MIL/uL — ABNORMAL LOW (ref 4.22–5.81)
RDW: 15.4 % (ref 11.5–15.5)
WBC: 14.4 10*3/uL — ABNORMAL HIGH (ref 4.0–10.5)
nRBC: 0.1 % (ref 0.0–0.2)

## 2020-12-03 LAB — CYTOLOGY - NON PAP

## 2020-12-03 LAB — BASIC METABOLIC PANEL
Anion gap: 5 (ref 5–15)
BUN: 18 mg/dL (ref 8–23)
CO2: 32 mmol/L (ref 22–32)
Calcium: 8 mg/dL — ABNORMAL LOW (ref 8.9–10.3)
Chloride: 103 mmol/L (ref 98–111)
Creatinine, Ser: 0.63 mg/dL (ref 0.61–1.24)
GFR, Estimated: >60 mL/min (ref >60)
Glucose, Bld: 105 mg/dL — ABNORMAL HIGH (ref 70–99)
Potassium: 4.2 mmol/L (ref 3.5–5.1)
Sodium: 140 mmol/L (ref 135–145)

## 2020-12-03 LAB — SURGICAL PATHOLOGY

## 2020-12-03 MED ORDER — AMOXICILLIN-POT CLAVULANATE 875-125 MG PO TABS: 1.0000 | ORAL_TABLET | Freq: Two times a day (BID) | ORAL | 0 refills | Status: DC

## 2020-12-03 MED ORDER — AMOXICILLIN-POT CLAVULANATE 875-125 MG PO TABS: 1.0000 | ORAL_TABLET | Freq: Two times a day (BID) | ORAL | 0 refills | Status: AC

## 2020-12-03 MED ORDER — PREDNISONE 20 MG PO TABS: 20.0000 mg | ORAL_TABLET | Freq: Every day | ORAL | Status: DC

## 2020-12-03 NOTE — Discharge Instructions (Addendum)
Keep pulse ox above 88% 4L oxygen especially while walking.  4 liters at rest for now.

## 2020-12-03 NOTE — Discharge Summary (Signed)
[facility] NAME: Chris Lozano    MR#:  [id number]  DATE OF BIRTH:  March 07, 1940  DATE OF ADMISSION:  11/27/2020 ADMITTING PHYSICIAN: Christel Mormon, MD  DATE OF DISCHARGE: 12/03/2020 12:43 PM  PRIMARY CARE PHYSICIAN: Tama High III, MD    ADMISSION DIAGNOSIS:  Acute respiratory failure ([place]) [J96.00] Respiratory distress [R06.03] Fever, unspecified fever cause [R50.9]  DISCHARGE DIAGNOSIS:  Active Problems:   Mass of lower lobe of left lung   Acute respiratory failure ([place])   SECONDARY DIAGNOSIS:   Past Medical History:  Diagnosis Date  . Asthma   . BPH (benign prostatic hyperplasia)   . CAD (coronary artery disease)   . COPD (chronic obstructive pulmonary disease) ([place])   . GERD (gastroesophageal reflux disease)   . HLD (hyperlipidemia)   . Sleep apnea     HOSPITAL COURSE:   1.  Acute on chronic hypoxic hypercarbic respiratory failure secondary to COPD exacerbation and postobstructive pneumonia.  The patient finished high-dose steroids here in the hospital but takes chronic steroids at home.  With ambulation today he was able to hold his sats in the upper 80s.  Patient will be going home on 4 L of oxygen at this point.  He does have a trilogy machine at home which he will wear at night. 2.  Large left lung mass status post bronchoscopy and biopsy on 12/01/2020.  Pathology was pending at the time of discharge.  Follow-up with Dr. Grayland Ormond oncology for treatment options. 3.  Postobstructive pneumonia.  Patient was on IV Unasyn we will switch over to Augmentin for 2 more doses upon going home. 4.  Sepsis, present on admission with tachypnea, tachycardia and postobstructive pneumonia. 5.  Hyperkalemia has resolved 6.  Initial blood cultures growing staph epi in 1 bottle only, likely contamination.  Repeat blood cultures negative for 4 days. 7.  Essential hypertension on low-dose Coreg 8.  Hypothyroidism unspecified on  levothyroxine 9.  Hyperlipidemia unspecified on atorvastatin 10.  History of CAD.  Can go back on aspirin as outpatient.  Continue Coreg and atorvastatin.  Case discussed with patient's daughter on the phone.  Follow-up with Dr. Grayland Ormond for treatment options of likely lung cancer.  If at any point that the patient does not want any further treatment would be a candidate for hospice.  DISCHARGE CONDITIONS:  Fair  CONSULTS OBTAINED:  Treatment Team:  Ottie Glazier, MD  DRUG ALLERGIES:   Allergies  Allergen Reactions  . Fluocinolone Other (See Comments)    Reaction: dizzines, "drunk" Other reaction(s): Unknown  . Ciprofloxacin Hcl     Dizzy     DISCHARGE MEDICATIONS:   Allergies as of 12/03/2020       Reactions   Fluocinolone Other (See Comments)   Reaction: dizzines, "drunk" Other reaction(s): Unknown   Ciprofloxacin Hcl    Dizzy         Medication List     TAKE these medications    albuterol 108 (90 Base) MCG/ACT inhaler Commonly known as: VENTOLIN HFA Inhale 2 puffs into the lungs every 6 (six) hours as needed for wheezing or shortness of breath.   amoxicillin-clavulanate 875-125 MG tablet Commonly known as: Augmentin Take 1 tablet by mouth 2 (two) times daily for 2 doses.   aspirin EC 81 MG tablet Take 81 mg by mouth daily.   atorvastatin 80 MG tablet Commonly known as: LIPITOR Take 80 mg by mouth daily.   carvedilol 3.125 MG tablet  Commonly known as: COREG Take 3.125 mg by mouth 2 (two) times daily.   cholecalciferol 25 MCG (1000 UNIT) tablet Commonly known as: VITAMIN D Take 1,000 Units by mouth daily.   formoterol 20 MCG/2ML nebulizer solution Commonly known as: PERFOROMIST Inhale 20 mcg into the lungs 2 (two) times daily.   furosemide 20 MG tablet Commonly known as: LASIX Take 20 mg by mouth daily as needed for fluid.   gabapentin 100 MG capsule Commonly known as: NEURONTIN Take 100 mg by mouth daily as needed (back pain.).    ipratropium 0.02 % nebulizer solution Commonly known as: ATROVENT Take 2.5 mLs by nebulization 4 (four) times daily.   levothyroxine 25 MCG tablet Commonly known as: SYNTHROID Take 25 mcg by mouth daily before breakfast.   multivitamin with minerals Tabs tablet Take 1 tablet by mouth in the morning.   potassium chloride 10 MEQ tablet Commonly known as: KLOR-CON Take 10 mEq by mouth daily as needed (supplementation with furosemide).   predniSONE 10 MG tablet Commonly known as: DELTASONE Take 10 mg by mouth in the morning. What changed: Another medication with the same name was removed. Continue taking this medication, and follow the directions you see here.   sulfamethoxazole-trimethoprim 400-80 MG tablet Commonly known as: BACTRIM Take 1 tablet by mouth every Monday, Wednesday, and Friday.   Vitamin C 500 MG Chew Chew 500 mg by mouth in the morning.               Durable Medical Equipment  (From admission, onward)           Start     Ordered   12/03/20 1402  For home use only DME oxygen  Once       Question Answer Comment  Length of Need Lifetime   Mode or (Route) Nasal cannula   Liters per Minute 4   Frequency Continuous (stationary and portable oxygen unit needed)   Oxygen conserving device Yes   Oxygen delivery system Gas      12/03/20 1402             DISCHARGE INSTRUCTIONS:  Follow-up PMD 5 days Follow-up with Dr. Grayland Ormond oncology 1 week  If you experience worsening of your admission symptoms, develop shortness of breath, life threatening emergency, suicidal or homicidal thoughts you must seek medical attention immediately by calling 911 or calling your MD immediately  if symptoms less severe.  You Must read complete instructions/literature along with all the possible adverse reactions/side effects for all the Medicines you take and that have been prescribed to you. Take any new Medicines after you have completely understood and accept all  the possible adverse reactions/side effects.   Please note  You were cared for by a hospitalist during your hospital stay. If you have any questions about your discharge medications or the care you received while you were in the hospital after you are discharged, you can call the unit and asked to speak with the hospitalist on call if the hospitalist that took care of you is not available. Once you are discharged, your primary care physician will handle any further medical issues. Please note that NO REFILLS for any discharge medications will be authorized once you are discharged, as it is imperative that you return to your primary care physician (or establish a relationship with a primary care physician if you do not have one) for your aftercare needs so that they can reassess your need for medications and monitor your lab values.  Today   CHIEF COMPLAINT:   Chief Complaint  Patient presents with  . Shortness of Breath    Pt. To ED from home via EMS c/o increasing SOB/work of breathing since lunchtime today. Per medic, pt. Was 74% on RA, temp was 102.7. Pt. Was on bipap in ED 3 weeks ago for pneumonia.    HISTORY OF PRESENT ILLNESS:  Chris Lozano  is a 80 y.o. male came in with shortness of breath   VITAL SIGNS:  Blood pressure 118/65, pulse 66, temperature 97.9 F (36.6 C), resp. rate 18, height 5\' 6"  (1.676 m), weight 71.2 kg, SpO2 95 %.  I/O:   Intake/Output Summary (Last 24 hours) at 12/03/2020 1715 Last data filed at 12/03/2020 0500 Gross per 24 hour  Intake --  Output 625 ml  Net -625 ml    PHYSICAL EXAMINATION:  GENERAL:  80 y.o.-year-old patient lying in the bed with no acute distress.  EYES: Pupils equal, round, reactive to light and accommodation. No scleral icterus.  HEENT: Head atraumatic, normocephalic. Oropharynx and nasopharynx clear.  LUNGS: decreased breath sounds bilaterally, no wheezing, rales,rhonchi or crepitation. No use of accessory muscles of  respiration.  CARDIOVASCULAR: S1, S2 normal. No murmurs, rubs, or gallops.  ABDOMEN: Soft, non-tender, non-distended.  EXTREMITIES: No pedal edema.  NEUROLOGIC: Cranial nerves II through XII are intact. Muscle strength 5/5 in all extremities. Sensation intact. Gait not checked.  PSYCHIATRIC: The patient is alert and oriented x 3.  SKIN: No obvious rash, lesion, or ulcer.   DATA REVIEW:   CBC Recent Labs  Lab 12/03/20 0424  WBC 14.4*  HGB 11.3*  HCT 35.5*  PLT 206    Chemistries  Recent Labs  Lab 11/27/20 1944 11/28/20 0616 12/03/20 0424  NA 138   < > 140  K 5.0   < > 4.2  CL 98   < > 103  CO2 33*   < > 32  GLUCOSE 161*   < > 105*  BUN 20   < > 18  CREATININE 1.00   < > 0.63  CALCIUM 8.4*   < > 8.0*  AST 32  --   --   ALT 29  --   --   ALKPHOS 90  --   --   BILITOT 0.8  --   --    < > = values in this interval not displayed.     Microbiology Results  Results for orders placed or performed during the hospital encounter of 11/27/20  Blood Culture (routine x 2)     Status: None   Collection Time: 11/27/20  7:44 PM   Specimen: BLOOD  Result Value Ref Range Status   Specimen Description BLOOD BLOOD LEFT FOREARM  Final   Special Requests   Final    BOTTLES DRAWN AEROBIC AND ANAEROBIC Blood Culture adequate volume   Culture   Final    NO GROWTH 5 DAYS Performed at [facility], [address], [place], [place] [address]    Report Status 12/02/2020 FINAL  Final  Blood Culture (routine x 2)     Status: Abnormal   Collection Time: 11/27/20  7:44 PM   Specimen: BLOOD  Result Value Ref Range Status   Specimen Description   Final    BLOOD RIGHT ANTECUBITAL Performed at [facility], [address] [place], [place] [address]    Special Requests   Final    BOTTLES DRAWN AEROBIC AND ANAEROBIC Blood Culture adequate volume Performed at [facility], [address] [place], [place] [address]    Culture  Setup Time   Final    GRAM  POSITIVE COCCI AEROBIC BOTTLE ONLY CRITICAL RESULT CALLED TO, READ BACK BY AND VERIFIED WITH: SUSAN WATSON 11/28/20 1526 KLW    Culture (A)  Final    STAPHYLOCOCCUS EPIDERMIDIS THE SIGNIFICANCE OF ISOLATING THIS ORGANISM FROM A SINGLE SET OF BLOOD CULTURES WHEN MULTIPLE SETS ARE DRAWN IS UNCERTAIN. PLEASE NOTIFY THE MICROBIOLOGY DEPARTMENT WITHIN ONE WEEK IF SPECIATION AND SENSITIVITIES ARE REQUIRED. Performed at [facility], [place] [address], [place], [place] [address]    Report Status 11/30/2020 FINAL  Final  Resp Panel by RT-PCR (Flu A&B, Covid) Nasopharyngeal Swab     Status: None   Collection Time: 11/27/20  7:44 PM   Specimen: Nasopharyngeal Swab; Nasopharyngeal(NP) swabs in vial transport medium  Result Value Ref Range Status   SARS Coronavirus 2 by RT PCR NEGATIVE NEGATIVE Final    Comment: (NOTE) SARS-CoV-2 target nucleic acids are NOT DETECTED.  The SARS-CoV-2 RNA is generally detectable in upper respiratory specimens during the acute phase of infection. The lowest concentration of SARS-CoV-2 viral copies this assay can detect is 138 copies/mL. A negative result does not preclude SARS-Cov-2 infection and should not be used as the sole basis for treatment or other patient management decisions. A negative result may occur with  improper specimen collection/handling, submission of specimen other than nasopharyngeal swab, presence of viral mutation(s) within the areas targeted by this assay, and inadequate number of viral copies(<138 copies/mL). A negative result must be combined with clinical observations, patient history, and epidemiological information. The expected result is Negative.  Fact Sheet for Patients:  EntrepreneurPulse.com.au  Fact Sheet for Healthcare Providers:  IncredibleEmployment.be  This test is no t yet approved or cleared by the [place] FDA and  has been authorized for detection and/or diagnosis of  SARS-CoV-2 by FDA under an Emergency Use Authorization (EUA). This EUA will remain  in effect (meaning this test can be used) for the duration of the COVID-19 declaration under Section 564(b)(1) of the Act, 21 U.S.C.section 360bbb-3(b)(1), unless the authorization is terminated  or revoked sooner.       Influenza A by PCR NEGATIVE NEGATIVE Final   Influenza B by PCR NEGATIVE NEGATIVE Final    Comment: (NOTE) The Xpert Xpress SARS-CoV-2/FLU/RSV plus assay is intended as an aid in the diagnosis of influenza from Nasopharyngeal swab specimens and should not be used as a sole basis for treatment. Nasal washings and aspirates are unacceptable for Xpert Xpress SARS-CoV-2/FLU/RSV testing.  Fact Sheet for Patients: EntrepreneurPulse.com.au  Fact Sheet for Healthcare Providers: IncredibleEmployment.be  This test is not yet approved or cleared by the [place] FDA and has been authorized for detection and/or diagnosis of SARS-CoV-2 by FDA under an Emergency Use Authorization (EUA). This EUA will remain in effect (meaning this test can be used) for the duration of the COVID-19 declaration under Section 564(b)(1) of the Act, 21 U.S.C. section 360bbb-3(b)(1), unless the authorization is terminated or revoked.  Performed at [facility], [address] [place], [place] [address]   Blood Culture ID Panel (Reflexed)     Status: Abnormal   Collection Time: 11/27/20  7:44 PM  Result Value Ref Range Status   Enterococcus faecalis NOT DETECTED NOT DETECTED Final   Enterococcus Faecium NOT DETECTED NOT DETECTED Final   Listeria monocytogenes NOT DETECTED NOT DETECTED Final   Staphylococcus species DETECTED (A) NOT DETECTED Final  Comment: CRITICAL RESULT CALLED TO, READ BACK BY AND VERIFIED WITH: SUSAN WATSON 11/28/20 1526 KLW    Staphylococcus aureus (BCID) NOT DETECTED NOT DETECTED Final   Staphylococcus epidermidis DETECTED (A) NOT  DETECTED Final    Comment: Methicillin (oxacillin) resistant coagulase negative staphylococcus. Possible blood culture contaminant (unless isolated from more than one blood culture draw or clinical case suggests pathogenicity). No antibiotic treatment is indicated for blood  culture contaminants. CRITICAL RESULT CALLED TO, READ BACK BY AND VERIFIED WITH: SUSAN WATSON 11/28/20 1526 KLW    Staphylococcus lugdunensis NOT DETECTED NOT DETECTED Final   Streptococcus species NOT DETECTED NOT DETECTED Final   Streptococcus agalactiae NOT DETECTED NOT DETECTED Final   Streptococcus pneumoniae NOT DETECTED NOT DETECTED Final   Streptococcus pyogenes NOT DETECTED NOT DETECTED Final   A.calcoaceticus-baumannii NOT DETECTED NOT DETECTED Final   Bacteroides fragilis NOT DETECTED NOT DETECTED Final   Enterobacterales NOT DETECTED NOT DETECTED Final   Enterobacter cloacae complex NOT DETECTED NOT DETECTED Final   Escherichia coli NOT DETECTED NOT DETECTED Final   Klebsiella aerogenes NOT DETECTED NOT DETECTED Final   Klebsiella oxytoca NOT DETECTED NOT DETECTED Final   Klebsiella pneumoniae NOT DETECTED NOT DETECTED Final   Proteus species NOT DETECTED NOT DETECTED Final   Salmonella species NOT DETECTED NOT DETECTED Final   Serratia marcescens NOT DETECTED NOT DETECTED Final   Haemophilus influenzae NOT DETECTED NOT DETECTED Final   Neisseria meningitidis NOT DETECTED NOT DETECTED Final   Pseudomonas aeruginosa NOT DETECTED NOT DETECTED Final   Stenotrophomonas maltophilia NOT DETECTED NOT DETECTED Final   Candida albicans NOT DETECTED NOT DETECTED Final   Candida auris NOT DETECTED NOT DETECTED Final   Candida glabrata NOT DETECTED NOT DETECTED Final   Candida krusei NOT DETECTED NOT DETECTED Final   Candida parapsilosis NOT DETECTED NOT DETECTED Final   Candida tropicalis NOT DETECTED NOT DETECTED Final   Cryptococcus neoformans/gattii NOT DETECTED NOT DETECTED Final   Methicillin resistance  mecA/C DETECTED (A) NOT DETECTED Final    Comment: CRITICAL RESULT CALLED TO, READ BACK BY AND VERIFIED WITH: SUSAN WATSON 11/28/20 [place] Performed at [facility], [address] [place], [place] [address]   MRSA Next Gen by PCR, Nasal     Status: None   Collection Time: 11/28/20  7:49 PM   Specimen: Nasal Mucosa; Nasal Swab  Result Value Ref Range Status   MRSA by PCR Next Gen NOT DETECTED NOT DETECTED Final    Comment: (NOTE) The GeneXpert MRSA Assay (FDA approved for NASAL specimens only), is one component of a comprehensive MRSA colonization surveillance program. It is not intended to diagnose MRSA infection nor to guide or monitor treatment for MRSA infections. Test performance is not FDA approved in patients less than 28 years old. Performed at [facility], [address], [place], [place] [address]   Urine Culture     Status: None   Collection Time: 11/28/20 10:20 PM   Specimen: Urine, Random  Result Value Ref Range Status   Specimen Description   Final    URINE, RANDOM Performed at [facility], [address] [place], [place] [address]    Special Requests   Final    NONE Performed at [facility], [address], [place], [place] [address]    Culture   Final    NO GROWTH Performed at [facility], [place] [address], [place],  [address]    Report Status 11/30/2020 FINAL  Final  CULTURE, BLOOD (ROUTINE X 2) w Reflex to ID Panel     Status: None (Preliminary result)   Collection Time: 11/29/20  8:32 AM   Specimen: BLOOD LEFT HAND  Result Value Ref Range Status   Specimen Description BLOOD LEFT HAND  Final   Special Requests   Final    BOTTLES DRAWN AEROBIC AND ANAEROBIC Blood Culture adequate volume   Culture   Final    NO GROWTH 4 DAYS Performed at [facility], [address], [place], [place] [address]    Report Status PENDING  Incomplete  CULTURE, BLOOD (ROUTINE X 2) w Reflex to ID Panel      Status: None (Preliminary result)   Collection Time: 11/29/20  8:32 AM   Specimen: BLOOD RIGHT HAND  Result Value Ref Range Status   Specimen Description BLOOD RIGHT HAND  Final   Special Requests   Final    BOTTLES DRAWN AEROBIC AND ANAEROBIC Blood Culture results may not be optimal due to an excessive volume of blood received in culture bottles   Culture   Final    NO GROWTH 4 DAYS Performed at [facility], [address], [place], [place] [address]    Report Status PENDING  Incomplete     Management plans discussed with the patient, family and they are in agreement.  CODE STATUS:     Code Status Orders  (From admission, onward)           Start     Ordered   11/28/20 0016  Do not attempt resuscitation (DNR)  Continuous       Question Answer Comment  In the event of cardiac or respiratory ARREST Do not call a "code blue"   In the event of cardiac or respiratory ARREST Do not perform Intubation, CPR, defibrillation or ACLS   In the event of cardiac or respiratory ARREST Use medication by any route, position, wound care, and other measures to relive pain and suffering. May use oxygen, suction and manual treatment of airway obstruction as needed for comfort.      11/28/20 0015           Code Status History     Date Active Date Inactive Code Status Order ID Comments User Context   11/03/2020 1940 11/07/2020 1737 DNR [id number]  Elwyn Reach, MD ED   10/27/2020 2138 10/30/2020 1807 DNR [id number]  Kayleen Memos, DO ED   10/27/2020 2056 10/27/2020 2138 Full Code [id number]  Kayleen Memos, DO ED   11/29/2016 0020 12/01/2016 2049 Full Code [id number]  Lance Coon, MD Inpatient       TOTAL TIME TAKING CARE OF THIS PATIENT: 35 minutes.    Loletha Grayer M.D on 12/03/2020 at 5:15 PM    [facility]  CC: Primary care physician; Adin Hector, MD

## 2020-12-03 NOTE — Progress Notes (Signed)
Pulmonary Medicine          Date: 12/03/2020,   MRN# [id number] Chris Lozano Jan 05, 1941     AdmissionWeight: 71.2 kg                 CurrentWeight: 71.2 kg   Refferring physician: Dr Jimmye Norman   CHIEF COMPLAINT:   Acute on chronic hypoxemic respiratory failure   HISTORY OF PRESENT ILLNESS   This is a very pleasant 80yo M with hx of Advanced COPD, dyslipidemia, OSA/Asthma overlap syndrome who came in to ER with worsening dyspnea. He has supportive family and daughter who keeps close watch over him. I had evaluated patient 11/21/20 in pulmonology clinic with [facility] and he shared that he was inconsistently compliant with his BIPAP and uses 2L bleed in O2 supplementally at his baseline while resting. He had Mass found on chest CT which was appx 4.9cm and we looked at images together and talked about diagnostic options including bronchoscopy with biopsy which patient wanted to have done asap. 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 He is now here in hospital for acute on chronic hypoxemic respiratory failure. PCCM consultation for further evaluation and management.   12/01/20- patient stable with no overnight events. Family including daughter and wife are at bedside during my evaluation. Plan for bronchoscopy today.   12/03/20- patient is cleared for dc home today. I met with family and reviewed findings as well as discussed FMLA paperwork for daughter.   PAST MEDICAL HISTORY   Past Medical History:  Diagnosis Date  . Asthma   . BPH (benign prostatic hyperplasia)   . CAD (coronary artery disease)   . COPD (chronic obstructive pulmonary disease) ([place])   . GERD (gastroesophageal reflux disease)   . HLD (hyperlipidemia)   . Sleep apnea      SURGICAL HISTORY   Past Surgical History:  Procedure Laterality Date  . ADENOIDECTOMY    . CARDIAC CATHETERIZATION    . GALLBLADDER SURGERY    . LEFT HEART CATH AND  CORONARY ANGIOGRAPHY Left 01/10/2019   Procedure: LEFT HEART CATH AND CORONARY ANGIOGRAPHY;  Surgeon: Teodoro Spray, MD;  Location: [place] [facility];  Service: Cardiovascular;  Laterality: Left;  . TONSILLECTOMY    . VIDEO BRONCHOSCOPY WITH ENDOBRONCHIAL NAVIGATION N/A 12/01/2020   Procedure: VIDEO BRONCHOSCOPY WITH ENDOBRONCHIAL NAVIGATION;  Surgeon: Ottie Glazier, MD;  Location: [facility];  Service: Thoracic;  Laterality: N/A;  . VIDEO BRONCHOSCOPY WITH ENDOBRONCHIAL ULTRASOUND N/A 12/01/2020   Procedure: VIDEO BRONCHOSCOPY WITH ENDOBRONCHIAL ULTRASOUND;  Surgeon: Ottie Glazier, MD;  Location: [facility];  Service: Thoracic;  Laterality: N/A;     FAMILY HISTORY   Family History  Problem Relation Age of Onset  . Heart disease Other   . Kidney disease Neg Hx   . Prostate cancer Neg Hx   . Kidney cancer Neg Hx   . Bladder Cancer Neg Hx      SOCIAL HISTORY   Social History   Tobacco Use  . Smoking status: Former  . Smokeless tobacco: Former    Types: Chew  . Tobacco comments:    as a teenager  Vaping Use  . Vaping Use: Never used  Substance Use Topics  . Alcohol use: Yes    Alcohol/week: 0.0 standard drinks    Comment: occasionally  . Drug use: No     MEDICATIONS    Home Medication:    Current  Medication:  Current Facility-Administered Medications:  .  0.9 %  sodium chloride infusion, , Intravenous, PRN, Wyvonnia Dusky, MD, Last Rate: 5 mL/hr at 12/02/20 1429, Infusion Verify at 12/02/20 1429 .  acetaminophen (TYLENOL) tablet 650 mg, 650 mg, Oral, Q6H PRN **OR** acetaminophen (TYLENOL) suppository 650 mg, 650 mg, Rectal, Q6H PRN, Mansy, Jan A, MD .  albuterol (PROVENTIL) (2.5 MG/3ML) 0.083% nebulizer solution 2.5 mg, 2.5 mg, Nebulization, Q4H PRN, Wyvonnia Dusky, MD .  Ampicillin-Sulbactam (UNASYN) 3 g in sodium chloride 0.9 % 100 mL IVPB, 3 g, Intravenous, Q8H, Wyvonnia Dusky, MD, Last Rate: 200 mL/hr at 12/03/20 0537, 3 g at 12/03/20 0537 .   ascorbic acid (VITAMIN C) tablet 500 mg, 500 mg, Oral, q AM, Mansy, Jan A, MD, 500 mg at 12/03/20 0905 .  atorvastatin (LIPITOR) tablet 80 mg, 80 mg, Oral, Daily, Mansy, Jan A, MD, 80 mg at 12/03/20 0900 .  carvedilol (COREG) tablet 3.125 mg, 3.125 mg, Oral, BID, Mansy, Jan A, MD, 3.125 mg at 12/03/20 0900 .  chlorpheniramine-HYDROcodone (TUSSIONEX) 10-8 MG/5ML suspension 5 mL, 5 mL, Oral, Q12H PRN, Mansy, Jan A, MD .  cholecalciferol (VITAMIN D) tablet 1,000 Units, 1,000 Units, Oral, Daily, Mansy, Jan A, MD, 1,000 Units at 12/03/20 0900 .  gabapentin (NEURONTIN) capsule 100 mg, 100 mg, Oral, Daily PRN, Mansy, Jan A, MD .  guaiFENesin (MUCINEX) 12 hr tablet 600 mg, 600 mg, Oral, BID, Mansy, Jan A, MD, 600 mg at 12/03/20 0900 .  levothyroxine (SYNTHROID) tablet 25 mcg, 25 mcg, Oral, QAC breakfast, Mansy, Jan A, MD, 25 mcg at 12/03/20 0534 .  magnesium hydroxide (MILK OF MAGNESIA) suspension 30 mL, 30 mL, Oral, Daily PRN, Mansy, Jan A, MD .  multivitamin with minerals tablet 1 tablet, 1 tablet, Oral, q AM, Mansy, Arvella Merles, MD, 1 tablet at 12/03/20 0905 .  ondansetron (ZOFRAN) tablet 4 mg, 4 mg, Oral, Q6H PRN **OR** ondansetron (ZOFRAN) injection 4 mg, 4 mg, Intravenous, Q6H PRN, Mansy, Jan A, MD .  sulfamethoxazole-trimethoprim (BACTRIM DS) 800-160 MG per tablet 1 tablet, 1 tablet, Oral, Once per day on Mon Wed Fri, Williams, Jamiese M, MD, 1 tablet at 12/03/20 0900 .  traZODone (DESYREL) tablet 25 mg, 25 mg, Oral, QHS PRN, Mansy, Arvella Merles, MD    ALLERGIES   Fluocinolone and Ciprofloxacin hcl     REVIEW OF SYSTEMS    Review of Systems:  Gen:  Denies  fever, sweats, chills weigh loss  HEENT: Denies blurred vision, double vision, ear pain, eye pain, hearing loss, nose bleeds, sore throat Cardiac:  No dizziness, chest pain or heaviness, chest tightness,edema Resp:   Denies cough or sputum porduction, shortness of breath,wheezing, hemoptysis,  Gi: Denies swallowing difficulty, stomach pain,  nausea or vomiting, diarrhea, constipation, bowel incontinence Gu:  Denies bladder incontinence, burning urine Ext:   Denies Joint pain, stiffness or swelling Skin: Denies  skin rash, easy bruising or bleeding or hives Endoc:  Denies polyuria, polydipsia , polyphagia or weight change Psych:   Denies depression, insomnia or hallucinations   Other:  All other systems negative   VS: BP 118/65 (BP Location: Left Arm)   Pulse 66   Temp 97.9 F (36.6 C)   Resp 18   Ht _0  (1.676 m)   Wt 71.2 kg   SpO2 95%   BMI 25.34 kg/m      PHYSICAL EXAM    GENERAL:NAD, no fevers, chills, no weakness no fatigue HEAD: Normocephalic, atraumatic.  EYES: Pupils equal,  round, reactive to light. Extraocular muscles intact. No scleral icterus.  MOUTH: Moist mucosal membrane. Dentition intact. No abscess noted.  EAR, NOSE, THROAT: Clear without exudates. No external lesions.  NECK: Supple. No thyromegaly. No nodules. No JVD.  PULMONARY: bilateral mild rhonchi CARDIOVASCULAR: S1 and S2. Regular rate and rhythm. No murmurs, rubs, or gallops. No edema. Pedal pulses 2+ bilaterally.  GASTROINTESTINAL: Soft, nontender, nondistended. No masses. Positive bowel sounds. No hepatosplenomegaly.  MUSCULOSKELETAL: No swelling, clubbing, or edema. Range of motion full in all extremities.  NEUROLOGIC: Cranial nerves II through XII are intact. No gross focal neurological deficits. Sensation intact. Reflexes intact.  SKIN: No ulceration, lesions, rashes, or cyanosis. Skin warm and dry. Turgor intact.  PSYCHIATRIC: Mood, affect within normal limits. The patient is awake, alert and oriented x 3. Insight, judgment intact.       IMAGING    CT Angio Chest PE W and/or Wo Contrast  Result Date: 11/28/2020 CLINICAL DATA:  Increased shortness of breath, hypoxia, fever EXAM: CT ANGIOGRAPHY CHEST WITH CONTRAST TECHNIQUE: Multidetector CT imaging of the chest was performed using the standard protocol during bolus  administration of intravenous contrast. Multiplanar CT image reconstructions and MIPs were obtained to evaluate the vascular anatomy. CONTRAST:  30m OMNIPAQUE IOHEXOL 350 MG/ML SOLN COMPARISON:  PET-CT dated 11/19/2020 FINDINGS: Cardiovascular: Satisfactory opacification the bilateral pulmonary arteries to the segmental level. No evidence of pulmonary embolism. Although not tailored for evaluation of the thoracic aorta, there is no evidence of thoracic aortic aneurysm or dissection. Atherosclerotic calcifications of the aortic arch. Heart is normal in size.  No pericardial effusion. Coronary atherosclerosis of the LAD and right coronary artery. Mediastinum/Nodes: 10 mm short axis left supraclavicular node (series 4/image 10), unchanged. Mediastinal lymphadenopathy, including a dominant 2.5 cm short axis subcarinal node (series 4/image 55), similar. Visualized thyroid is unremarkable. Lungs/Pleura: 4.5 x 6.7 cm left lower lobe mass (series 6/image 56), compatible with primary bronchogenic neoplasm when correlating with recent PET. Mild subpleural patchy opacities in the left lower lobe (series 6/image 33), unchanged, possibly post obstructive. New patchy opacities in the central right middle lobe (series 6/image 32), suspicious for pneumonia. Small right pleural effusion. Associated right lower lobe atelectasis. Moderate centrilobular and paraseptal emphysematous changes, upper lung predominant. No pneumothorax. Upper Abdomen: Visualized upper abdomen is notable for prior cholecystectomy and vascular calcifications. Musculoskeletal: Mild degenerative changes of the visualized thoracolumbar spine. Review of the MIP images confirms the above findings. IMPRESSION: No evidence of pulmonary embolism. 6.7 cm left lower lobe mass, compatible with primary bronchogenic neoplasm when correlating with recent CT. Associated thoracic nodal metastases. Mild subpleural patchy opacities in the left lower lobe, unchanged, possibly  postobstructive. New patchy opacities in the central right middle lobe, suspicious for pneumonia. Aortic Atherosclerosis (ICD10-I70.0) and Emphysema (ICD10-J43.9). Electronically Signed   By: SJulian HyM.D.   On: 11/28/2020 00:28   MR BRAIN W WO CONTRAST  Result Date: 11/05/2020 CLINICAL DATA:  Non-small-cell lung cancer staging EXAM: MRI HEAD WITHOUT AND WITH CONTRAST TECHNIQUE: Multiplanar, multiecho pulse sequences of the brain and surrounding structures were obtained without and with intravenous contrast. CONTRAST:  737mGADAVIST GADOBUTROL 1 MMOL/ML IV SOLN COMPARISON:  CT head 10/27/2020 FINDINGS: Brain: No acute infarction, hemorrhage, hydrocephalus, extra-axial collection or mass lesion. Generalized atrophy. Mild to moderate white matter hyperintensity bilaterally. Hyperintensity in the central pons. Normal enhancement.  Negative for metastatic disease to the brain. Vascular: Normal arterial flow voids at the skull base Skull and upper cervical spine: Negative Sinuses/Orbits: Mild mucosal  edema paranasal sinuses. Bilateral cataract extraction Other: None IMPRESSION: Negative for metastatic disease to brain Atrophy and chronic microvascular ischemic change in the white matter. Electronically Signed   By: Franchot Gallo M.D.   On: 11/05/2020 19:51   NM PET Image Initial (PI) Skull Base To Thigh  Result Date: 11/20/2020 CLINICAL DATA:  Initial treatment strategy for pulmonary nodule. EXAM: NUCLEAR MEDICINE PET SKULL BASE TO THIGH TECHNIQUE: 9.46 mCi F-18 FDG was injected intravenously. Full-ring PET imaging was performed from the skull base to thigh after the radiotracer. CT data was obtained and used for attenuation correction and anatomic localization. Fasting blood glucose: 99 mg/dl COMPARISON:  Chest CT dated October 27, 2020 FINDINGS: Mediastinal blood pool activity: SUV max 2.8 Liver activity: SUV max 3.2 NECK: No hypermetabolic lymph nodes in the neck. Incidental CT findings: none  CHEST: Large hypermetabolic left lower lobe lung mass measuring approximately 6.4 x 4.5 cm, unchanged when remeasured in similar plane, with of SUV max of 18.6. Adjacent ground-glass opacities which possibly represent postobstructive changes versus lymphangitic carcinomatosis. Enlarged and hypermetabolic mediastinal, left supraclavicular lymph nodes, and bilateral cardiophrenic lymph nodes. Reference subcarinal lymph node measures 2.8 cm in short axis on image 100 with an SUV max of 13.5. Reference right lower paratracheal lymph node measures 1.7 cm in short axis on image 86 with an SUV max of 12.5. Reference left supraclavicular lymph node measures 9 mm in short axis on image 58 with an SUV max of 6.1. Right cardiophrenic lymph node measuring 7 mm in short axis on image 119 with an SUV max of 3.1. Incidental CT findings: Three-vessel coronary artery calcifications. Atherosclerotic disease of the thoracic aorta. Severe upper lobe predominant centrilobular emphysema. Ground-glass opacity of the right middle lobe with mild FDG uptake, likely scarring related to prior infection. ABDOMEN/PELVIS: No abnormal hypermetabolic activity within the liver, pancreas, adrenal glands, or spleen. No hypermetabolic lymph nodes in the abdomen or pelvis. Incidental CT findings: Cholecystectomy clips diverticulosis of the descending and sigmoid colon. Atherosclerotic disease of the abdominal aorta. SKELETON: No focal hypermetabolic activity to suggest skeletal metastasis. Incidental CT findings: none IMPRESSION: Large hypermetabolic left lower lobe lung mass with adjacent hypermetabolic ground-glass opacities which possibly represent postobstructive change versus lymphangitic carcinomatosis. Enlarged and hypermetabolic mediastinal, left supraclavicular lymph nodes, and bilateral cardiophrenic lymph nodes, compatible with metastatic disease. No evidence of metastatic disease in the abdomen or pelvis. Aortic Atherosclerosis (ICD10-I70.0)  and Emphysema (ICD10-J43.9). Electronically Signed   By: Yetta Glassman M.D.   On: 11/20/2020 16:26   DG Chest Port 1 View  Result Date: 12/01/2020 CLINICAL DATA:  S/P bronchoscopy Z98.890 (ICD-10-CM) EXAM: PORTABLE CHEST 1 VIEW COMPARISON:  November 27, 2020. FINDINGS: Stable cardiomediastinal silhouette. Persistent masslike consolidation in the left midlung. No visible pleural effusions or pneumothorax on this semi erect radiograph. Similar patchy interstitial and airspace opacities in both lungs. IMPRESSION: 1. No visible pneumothorax status post bronchoscopy on this semi erect radiograph. 2. Similar masslike consolidation left midlung, better characterized on recent CT chest. 3. Similar patchy interstitial and airspace opacities in both lungs, possibly pneumonia. Electronically Signed   By: Margaretha Sheffield M.D.   On: 12/01/2020 19:36   DG Chest Port 1 View  Result Date: 11/27/2020 CLINICAL DATA:  Sepsis, increasing shortness of breath, left lung mass EXAM: PORTABLE CHEST 1 VIEW COMPARISON:  11/03/2020, 11/19/2020 FINDINGS: 2 frontal views of the chest demonstrate a stable cardiac silhouette. The masslike left lower lobe consolidation seen on prior PET scan is again identified unchanged, concerning  for malignancy based on previous PET scan findings. The adjacent interstitial and ground-glass opacities throughout the left lung could reflect lymphangitic spread of disease. Stable background scarring elsewhere throughout the lungs. No new consolidation, effusion, or pneumothorax. No acute bony abnormality. IMPRESSION: 1. Stable masslike left lower lobe consolidation concerning for malignancy based on previous PET scan findings. Interstitial opacities throughout the left lower lobe could reflect lymphangitic spread of disease. 2. Otherwise stable background scarring without new airspace disease or effusion. Electronically Signed   By: Randa Ngo M.D.   On: 11/27/2020 20:04   DG Chest Portable 1  View  Result Date: 11/03/2020 CLINICAL DATA:  Short of breath, COPD EXAM: PORTABLE CHEST 1 VIEW COMPARISON:  10/27/2020 FINDINGS: Single frontal view of the chest demonstrates stable masslike consolidation at the left hilum. There is increasing consolidation within the left lung, with underlying volume loss. Background emphysema is again noted. Developing consolidation at the right lung base may be hypoventilatory. No large effusion or pneumothorax. IMPRESSION: 1. Persistent masslike consolidation at the left hilum, concerning for malignancy based on prior CT 10/27/2020. 2. Increasing left lung consolidation, which could reflect postobstructive change. 3. Developing right basilar consolidation, favor atelectasis. 4. Stable background emphysema. Electronically Signed   By: Randa Ngo M.D.   On: 11/03/2020 18:15   DG C-Arm 1-60 Min-No Report  Result Date: 12/01/2020 Fluoroscopy was utilized by the requesting physician.  No radiographic interpretation.      ASSESSMENT/PLAN   Mild acute exacerbation of COPD with hypoxemia and hypercapnia -Agree with Unasyn and prednisone combination with typical COPD carepath utilizing Duoneb    -Left lung mass with hilar lymphadenopathy 4.9cm LLL mass  - plan for tissue diagnosis in AM  - Navigational bronchoscopy and EBUS planned for 1230pm  - patient has not had anticoagulation nor antiplatelets  - patient is NPO  - patient is stable and wishes to proceed with surgical plan    Thank you for allowing me to participate in the care of this patient.  Total face to face encounter time for this patient visit was >45 min. >50% of the time was  spent in counseling and coordination of care.   Patient/Family are satisfied with care plan and all questions have been answered.  This document was prepared using Dragon voice recognition software and may include unintentional dictation errors.     Ottie Glazier, M.D.  Division of [place]

## 2020-12-03 NOTE — Progress Notes (Signed)
Pt ambulated to nursing station / 02 93% on 4L [place]/ tolerated well/ MD aware  Discharge instructions explained to pt and pts spouse/ verbalized an understanding/ iv and tele removed/ will transport off unit via wheelchair with home 02.

## 2020-12-04 LAB — CULTURE, BLOOD (ROUTINE X 2)
Culture: NO GROWTH
Culture: NO GROWTH
Special Requests: ADEQUATE

## 2020-12-05 ENCOUNTER — Encounter: Payer: Self-pay | Admitting: *Deleted

## 2020-12-05 NOTE — Progress Notes (Deleted)
[name]  [phone] [phone]) [phone]  ID: Chris Lozano OB: [birth year]  MR#: [id number]  SAY#:[id number]  Patient Care Team: Adin Hector, MD as PCP - General (Internal Medicine) Telford Nab, RN as Oncology Nurse Navigator  CHIEF COMPLAINT: Stage IIIc adenocarcinoma of the left lung.    INTERVAL HISTORY: Patient returns to clinic today for hospital follow-up, discussion of his PET scan results, and additional diagnostic planning.  He continues to have chronic shortness of breath, but this is at his baseline.  He otherwise feels well.  He has no neurologic complaints.  He denies any recent fevers.  He has a good appetite and denies weight loss.  He denies any pain.  He has no chest pain, cough, or hemoptysis.  He denies any nausea, vomiting, constipation, or diarrhea.  He has no urinary complaints.  Patient offers no further specific complaints today.  REVIEW OF SYSTEMS:   Review of Systems  Constitutional: Negative.  Negative for chills, fever and malaise/fatigue.  Respiratory:  Positive for shortness of breath. Negative for cough and hemoptysis.   Cardiovascular: Negative.  Negative for chest pain and leg swelling.  Gastrointestinal: Negative.  Negative for abdominal pain.  Genitourinary: Negative.  Negative for dysuria.  Musculoskeletal: Negative.  Negative for back pain.  Skin: Negative.  Negative for rash.  Neurological: Negative.  Negative for dizziness, focal weakness, weakness and headaches.  Psychiatric/Behavioral: Negative.  The patient is not nervous/anxious.    As per HPI. Otherwise, a complete review of systems is negative.  PAST MEDICAL HISTORY: Past Medical History:  Diagnosis Date   Asthma    BPH (benign prostatic hyperplasia)    CAD (coronary artery disease)    COPD (chronic obstructive pulmonary disease) (HCC)    GERD (gastroesophageal reflux disease)    HLD (hyperlipidemia)    Sleep apnea     PAST SURGICAL  HISTORY: Past Surgical History:  Procedure Laterality Date   ADENOIDECTOMY     CARDIAC CATHETERIZATION     GALLBLADDER SURGERY     LEFT HEART CATH AND CORONARY ANGIOGRAPHY Left 01/10/2019   Procedure: LEFT HEART CATH AND CORONARY ANGIOGRAPHY;  Surgeon: Teodoro Spray, MD;  Location: [facility];  Service: Cardiovascular;  Laterality: Left;   TONSILLECTOMY     VIDEO BRONCHOSCOPY WITH ENDOBRONCHIAL NAVIGATION N/A 12/01/2020   Procedure: VIDEO BRONCHOSCOPY WITH ENDOBRONCHIAL NAVIGATION;  Surgeon: Ottie Glazier, MD;  Location: [facility];  Service: Thoracic;  Laterality: N/A;   VIDEO BRONCHOSCOPY WITH ENDOBRONCHIAL ULTRASOUND N/A 12/01/2020   Procedure: VIDEO BRONCHOSCOPY WITH ENDOBRONCHIAL ULTRASOUND;  Surgeon: Ottie Glazier, MD;  Location: [facility];  Service: Thoracic;  Laterality: N/A;    FAMILY HISTORY: Family History  Problem Relation Age of Onset   Heart disease Other    Kidney disease Neg Hx    Prostate cancer Neg Hx    Kidney cancer Neg Hx    Bladder Cancer Neg Hx     ADVANCED DIRECTIVES (Y/N):  N  HEALTH MAINTENANCE: Social History   Tobacco Use   Smoking status: Former   Smokeless tobacco: Former    Types: Chew   Tobacco comments:    as a teenager  Scientific laboratory technician Use: Never used  Substance Use Topics   Alcohol use: Yes    Alcohol/week: 0.0 standard drinks    Comment: occasionally   Drug use: No     Colonoscopy:  PAP:  Bone density:  Lipid panel:  Allergies  Allergen Reactions   Fluocinolone  Other (See Comments)    Reaction: dizzines, "drunk" Other reaction(s): Unknown   Ciprofloxacin Hcl     Dizzy     Current Outpatient Medications  Medication Sig Dispense Refill   albuterol (PROVENTIL HFA;VENTOLIN HFA) 108 (90 Base) MCG/ACT inhaler Inhale 2 puffs into the lungs every 6 (six) hours as needed for wheezing or shortness of breath.      Ascorbic Acid (VITAMIN C) 500 MG CHEW Chew 500 mg by mouth in the morning.     aspirin EC 81 MG  tablet Take 81 mg by mouth daily.     atorvastatin (LIPITOR) 80 MG tablet Take 80 mg by mouth daily.     carvedilol (COREG) 3.125 MG tablet Take 3.125 mg by mouth 2 (two) times daily.     cholecalciferol (VITAMIN D) 25 MCG (1000 UNIT) tablet Take 1,000 Units by mouth daily.     formoterol (PERFOROMIST) 20 MCG/2ML nebulizer solution Inhale 20 mcg into the lungs 2 (two) times daily.      furosemide (LASIX) 20 MG tablet Take 20 mg by mouth daily as needed for fluid.     gabapentin (NEURONTIN) 100 MG capsule Take 100 mg by mouth daily as needed (back pain.).     ipratropium (ATROVENT) 0.02 % nebulizer solution Take 2.5 mLs by nebulization 4 (four) times daily.     levothyroxine (SYNTHROID) 25 MCG tablet Take 25 mcg by mouth daily before breakfast.     Multiple Vitamin (MULTIVITAMIN WITH MINERALS) TABS tablet Take 1 tablet by mouth in the morning.     potassium chloride (KLOR-CON) 10 MEQ tablet Take 10 mEq by mouth daily as needed (supplementation with furosemide).     predniSONE (DELTASONE) 10 MG tablet Take 10 mg by mouth in the morning.     sulfamethoxazole-trimethoprim (BACTRIM) 400-80 MG tablet Take 1 tablet by mouth every Monday, Wednesday, and Friday.     No current facility-administered medications for this visit.    OBJECTIVE: There were no vitals filed for this visit.    There is no height or weight on file to calculate BMI.    ECOG FS:1 - Symptomatic but completely ambulatory  General: Well-developed, well-nourished, no acute distress. Eyes: Pink conjunctiva, anicteric sclera. HEENT: Normocephalic, moist mucous membranes. Lungs: No audible wheezing or coughing. Heart: Regular rate and rhythm. Abdomen: Soft, nontender, no obvious distention. Musculoskeletal: No edema, cyanosis, or clubbing. Neuro: Alert, answering all questions appropriately. Cranial nerves grossly intact. Skin: No rashes or petechiae noted. Psych: Normal affect. Lymphatics: No cervical, calvicular, axillary or  inguinal LAD.   LAB RESULTS:  Lab Results  Component Value Date   NA 140 12/03/2020   K 4.2 12/03/2020   CL 103 12/03/2020   CO2 32 12/03/2020   GLUCOSE 105 (H) 12/03/2020   BUN 18 12/03/2020   CREATININE 0.63 12/03/2020   CALCIUM 8.0 (L) 12/03/2020   PROT 6.9 11/27/2020   ALBUMIN 3.1 (L) 11/27/2020   AST 32 11/27/2020   ALT 29 11/27/2020   ALKPHOS 90 11/27/2020   BILITOT 0.8 11/27/2020   GFRNONAA >60 12/03/2020   GFRAA >60 11/29/2016    Lab Results  Component Value Date   WBC 14.4 (H) 12/03/2020   NEUTROABS 8.8 (H) 11/27/2020   HGB 11.3 (L) 12/03/2020   HCT 35.5 (L) 12/03/2020   MCV 94.4 12/03/2020   PLT 206 12/03/2020     STUDIES: CT Angio Chest PE W and/or Wo Contrast  Result Date: 11/28/2020 CLINICAL DATA:  Increased shortness of breath, hypoxia, fever EXAM: CT  ANGIOGRAPHY CHEST WITH CONTRAST TECHNIQUE: Multidetector CT imaging of the chest was performed using the standard protocol during bolus administration of intravenous contrast. Multiplanar CT image reconstructions and MIPs were obtained to evaluate the vascular anatomy. CONTRAST:  60mL OMNIPAQUE IOHEXOL 350 MG/ML SOLN COMPARISON:  PET-CT dated 11/19/2020 FINDINGS: Cardiovascular: Satisfactory opacification the bilateral pulmonary arteries to the segmental level. No evidence of pulmonary embolism. Although not tailored for evaluation of the thoracic aorta, there is no evidence of thoracic aortic aneurysm or dissection. Atherosclerotic calcifications of the aortic arch. Heart is normal in size.  No pericardial effusion. Coronary atherosclerosis of the LAD and right coronary artery. Mediastinum/Nodes: 10 mm short axis left supraclavicular node (series 4/image 10), unchanged. Mediastinal lymphadenopathy, including a dominant 2.5 cm short axis subcarinal node (series 4/image 55), similar. Visualized thyroid is unremarkable. Lungs/Pleura: 4.5 x 6.7 cm left lower lobe mass (series 6/image 56), compatible with primary  bronchogenic neoplasm when correlating with recent PET. Mild subpleural patchy opacities in the left lower lobe (series 6/image 33), unchanged, possibly post obstructive. New patchy opacities in the central right middle lobe (series 6/image 32), suspicious for pneumonia. Small right pleural effusion. Associated right lower lobe atelectasis. Moderate centrilobular and paraseptal emphysematous changes, upper lung predominant. No pneumothorax. Upper Abdomen: Visualized upper abdomen is notable for prior cholecystectomy and vascular calcifications. Musculoskeletal: Mild degenerative changes of the visualized thoracolumbar spine. Review of the MIP images confirms the above findings. IMPRESSION: No evidence of pulmonary embolism. 6.7 cm left lower lobe mass, compatible with primary bronchogenic neoplasm when correlating with recent CT. Associated thoracic nodal metastases. Mild subpleural patchy opacities in the left lower lobe, unchanged, possibly postobstructive. New patchy opacities in the central right middle lobe, suspicious for pneumonia. Aortic Atherosclerosis (ICD10-I70.0) and Emphysema (ICD10-J43.9). Electronically Signed   By: Julian Hy M.D.   On: 11/28/2020 00:28   MR BRAIN W WO CONTRAST  Result Date: 11/05/2020 CLINICAL DATA:  Non-small-cell lung cancer staging EXAM: MRI HEAD WITHOUT AND WITH CONTRAST TECHNIQUE: Multiplanar, multiecho pulse sequences of the brain and surrounding structures were obtained without and with intravenous contrast. CONTRAST:  22mL GADAVIST GADOBUTROL 1 MMOL/ML IV SOLN COMPARISON:  CT head 10/27/2020 FINDINGS: Brain: No acute infarction, hemorrhage, hydrocephalus, extra-axial collection or mass lesion. Generalized atrophy. Mild to moderate white matter hyperintensity bilaterally. Hyperintensity in the central pons. Normal enhancement.  Negative for metastatic disease to the brain. Vascular: Normal arterial flow voids at the skull base Skull and upper cervical spine:  Negative Sinuses/Orbits: Mild mucosal edema paranasal sinuses. Bilateral cataract extraction Other: None IMPRESSION: Negative for metastatic disease to brain Atrophy and chronic microvascular ischemic change in the white matter. Electronically Signed   By: Franchot Gallo M.D.   On: 11/05/2020 19:51   NM PET Image Initial (PI) Skull Base To Thigh  Result Date: 11/20/2020 CLINICAL DATA:  Initial treatment strategy for pulmonary nodule. EXAM: NUCLEAR MEDICINE PET SKULL BASE TO THIGH TECHNIQUE: 9.46 mCi F-18 FDG was injected intravenously. Full-ring PET imaging was performed from the skull base to thigh after the radiotracer. CT data was obtained and used for attenuation correction and anatomic localization. Fasting blood glucose: 99 mg/dl COMPARISON:  Chest CT dated October 27, 2020 FINDINGS: Mediastinal blood pool activity: SUV max 2.8 Liver activity: SUV max 3.2 NECK: No hypermetabolic lymph nodes in the neck. Incidental CT findings: none CHEST: Large hypermetabolic left lower lobe lung mass measuring approximately 6.4 x 4.5 cm, unchanged when remeasured in similar plane, with of SUV max of 18.6. Adjacent ground-glass opacities which  possibly represent postobstructive changes versus lymphangitic carcinomatosis. Enlarged and hypermetabolic mediastinal, left supraclavicular lymph nodes, and bilateral cardiophrenic lymph nodes. Reference subcarinal lymph node measures 2.8 cm in short axis on image 100 with an SUV max of 13.5. Reference right lower paratracheal lymph node measures 1.7 cm in short axis on image 86 with an SUV max of 12.5. Reference left supraclavicular lymph node measures 9 mm in short axis on image 58 with an SUV max of 6.1. Right cardiophrenic lymph node measuring 7 mm in short axis on image 119 with an SUV max of 3.1. Incidental CT findings: Three-vessel coronary artery calcifications. Atherosclerotic disease of the thoracic aorta. Severe upper lobe predominant centrilobular emphysema.  Ground-glass opacity of the right middle lobe with mild FDG uptake, likely scarring related to prior infection. ABDOMEN/PELVIS: No abnormal hypermetabolic activity within the liver, pancreas, adrenal glands, or spleen. No hypermetabolic lymph nodes in the abdomen or pelvis. Incidental CT findings: Cholecystectomy clips diverticulosis of the descending and sigmoid colon. Atherosclerotic disease of the abdominal aorta. SKELETON: No focal hypermetabolic activity to suggest skeletal metastasis. Incidental CT findings: none IMPRESSION: Large hypermetabolic left lower lobe lung mass with adjacent hypermetabolic ground-glass opacities which possibly represent postobstructive change versus lymphangitic carcinomatosis. Enlarged and hypermetabolic mediastinal, left supraclavicular lymph nodes, and bilateral cardiophrenic lymph nodes, compatible with metastatic disease. No evidence of metastatic disease in the abdomen or pelvis. Aortic Atherosclerosis (ICD10-I70.0) and Emphysema (ICD10-J43.9). Electronically Signed   By: Yetta Glassman M.D.   On: 11/20/2020 16:26   DG Chest Port 1 View  Result Date: 12/01/2020 CLINICAL DATA:  S/P bronchoscopy Z98.890 (ICD-10-CM) EXAM: PORTABLE CHEST 1 VIEW COMPARISON:  November 27, 2020. FINDINGS: Stable cardiomediastinal silhouette. Persistent masslike consolidation in the left midlung. No visible pleural effusions or pneumothorax on this semi erect radiograph. Similar patchy interstitial and airspace opacities in both lungs. IMPRESSION: 1. No visible pneumothorax status post bronchoscopy on this semi erect radiograph. 2. Similar masslike consolidation left midlung, better characterized on recent CT chest. 3. Similar patchy interstitial and airspace opacities in both lungs, possibly pneumonia. Electronically Signed   By: Margaretha Sheffield M.D.   On: 12/01/2020 19:36   DG Chest Port 1 View  Result Date: 11/27/2020 CLINICAL DATA:  Sepsis, increasing shortness of breath, left lung  mass EXAM: PORTABLE CHEST 1 VIEW COMPARISON:  11/03/2020, 11/19/2020 FINDINGS: 2 frontal views of the chest demonstrate a stable cardiac silhouette. The masslike left lower lobe consolidation seen on prior PET scan is again identified unchanged, concerning for malignancy based on previous PET scan findings. The adjacent interstitial and ground-glass opacities throughout the left lung could reflect lymphangitic spread of disease. Stable background scarring elsewhere throughout the lungs. No new consolidation, effusion, or pneumothorax. No acute bony abnormality. IMPRESSION: 1. Stable masslike left lower lobe consolidation concerning for malignancy based on previous PET scan findings. Interstitial opacities throughout the left lower lobe could reflect lymphangitic spread of disease. 2. Otherwise stable background scarring without new airspace disease or effusion. Electronically Signed   By: Randa Ngo M.D.   On: 11/27/2020 20:04   DG C-Arm 1-60 Min-No Report  Result Date: 12/01/2020 Fluoroscopy was utilized by the requesting physician.  No radiographic interpretation.    ASSESSMENT: Stage IIIc adenocarcinoma of the left lung.  PLAN:    Stage IIIc adenocarcinoma of the left lung: PET scan results from November 19, 2020 reviewed independently and reported as above with large 6.4 cm hypermetabolic left lower lobe mass consistent with malignancy.  Patient also noted to have FDG  avid mediastinal, left supraclavicular, and bilateral costophrenic lymph nodes.  MRI of the brain on October 28, 2020 did not reveal any metastatic disease.  Patient will require bronchoscopy/EBUS with biopsy to confirm the diagnosis.  Return to clinic 1 week after his procedure to discuss his final pathology results and treatment planning. Oxygen requirement: Patient reports he currently is on 3 L of oxygen which is approximately his baseline.  I spent a total of 30 minutes reviewing chart data, face-to-face evaluation with the  patient, counseling and coordination of care as detailed above.   Patient expressed understanding and was in agreement with this plan. He also understands that He can call clinic at any time with any questions, concerns, or complaints.   Cancer Staging No matching staging information was found for the patient.  Lloyd Huger, MD   12/05/2020 9:52 AM

## 2020-12-05 NOTE — Progress Notes (Signed)
Request for [facility] faxed to pathology.

## 2020-12-09 DEATH — deceased

## 2020-12-11 ENCOUNTER — Ambulatory Visit: Payer: Medicare Other | Admitting: Radiation Oncology

## 2020-12-11 ENCOUNTER — Encounter: Payer: Self-pay | Admitting: *Deleted

## 2020-12-11 ENCOUNTER — Inpatient Hospital Stay: Payer: Medicare Other | Admitting: Oncology

## 2020-12-11 DIAGNOSIS — C3492 Malignant neoplasm of unspecified part of left bronchus or lung: Secondary | ICD-10-CM

## 2020-12-11 NOTE — Progress Notes (Signed)
Omniseq cancelled.

## 2020-12-11 NOTE — Progress Notes (Signed)
Spoke with pt's daughter who stated that pt passed away unexpectedly over the weekend. Condolences provided. Informed that will cancel his appts and inform his clinical team. Biopsy results reviewed with pt's daughter per her request. Encouraged pt's daughter to call with any further questions or needs.
